# Patient Record
Sex: Female | Born: 1935
Health system: Southern US, Community
[De-identification: ages and names within clinical notes are randomized; demographics above are authoritative.]

## PROBLEM LIST (undated history)

## (undated) DIAGNOSIS — F419 Anxiety disorder, unspecified: Secondary | ICD-10-CM

## (undated) DIAGNOSIS — K219 Gastro-esophageal reflux disease without esophagitis: Secondary | ICD-10-CM

## (undated) DIAGNOSIS — E785 Hyperlipidemia, unspecified: Secondary | ICD-10-CM

## (undated) DIAGNOSIS — F329 Major depressive disorder, single episode, unspecified: Secondary | ICD-10-CM

## (undated) DIAGNOSIS — I10 Essential (primary) hypertension: Secondary | ICD-10-CM

## (undated) DIAGNOSIS — I639 Cerebral infarction, unspecified: Secondary | ICD-10-CM

## (undated) DIAGNOSIS — F3289 Other specified depressive episodes: Secondary | ICD-10-CM

## (undated) DIAGNOSIS — G459 Transient cerebral ischemic attack, unspecified: Secondary | ICD-10-CM

## (undated) HISTORY — PX: CHOLECYSTECTOMY: SHX55

## (undated) HISTORY — PX: CERVIX SURGERY: SHX593

## (undated) HISTORY — DX: Hyperlipidemia, unspecified: E78.5

## (undated) HISTORY — PX: TUBAL LIGATION: SHX77

## (undated) HISTORY — PX: BLADDER REPAIR: SHX76

## (undated) HISTORY — DX: Essential (primary) hypertension: I10

## (undated) HISTORY — DX: Major depressive disorder, single episode, unspecified: F32.9

## (undated) HISTORY — DX: Transient cerebral ischemic attack, unspecified: G45.9

## (undated) HISTORY — DX: Gastro-esophageal reflux disease without esophagitis: K21.9

## (undated) HISTORY — DX: Other specified depressive episodes: F32.89

## (undated) HISTORY — PX: TONSILLECTOMY: SUR1361

## (undated) HISTORY — DX: Cerebral infarction, unspecified: I63.9

## (undated) HISTORY — DX: Anxiety disorder, unspecified: F41.9

---

## 2000-12-13 ENCOUNTER — Ambulatory Visit (HOSPITAL_COMMUNITY): Admission: RE | Admit: 2000-12-13 | Discharge: 2000-12-13 | Payer: Self-pay | Admitting: Family Medicine

## 2000-12-13 ENCOUNTER — Encounter: Payer: Self-pay | Admitting: Family Medicine

## 2001-02-27 ENCOUNTER — Other Ambulatory Visit: Admission: RE | Admit: 2001-02-27 | Discharge: 2001-02-27 | Payer: Self-pay | Admitting: Neurology

## 2001-06-16 ENCOUNTER — Encounter: Payer: Self-pay | Admitting: Family Medicine

## 2001-06-16 ENCOUNTER — Ambulatory Visit (HOSPITAL_COMMUNITY): Admission: RE | Admit: 2001-06-16 | Discharge: 2001-06-16 | Payer: Self-pay | Admitting: Family Medicine

## 2002-06-12 ENCOUNTER — Encounter: Payer: Self-pay | Admitting: Internal Medicine

## 2002-06-12 ENCOUNTER — Ambulatory Visit (HOSPITAL_COMMUNITY): Admission: RE | Admit: 2002-06-12 | Discharge: 2002-06-12 | Payer: Self-pay | Admitting: Internal Medicine

## 2004-12-07 ENCOUNTER — Ambulatory Visit: Payer: Self-pay | Admitting: Family Medicine

## 2005-01-17 ENCOUNTER — Ambulatory Visit: Payer: Self-pay | Admitting: Family Medicine

## 2005-03-22 ENCOUNTER — Ambulatory Visit: Payer: Self-pay | Admitting: Family Medicine

## 2005-07-05 ENCOUNTER — Ambulatory Visit: Payer: Self-pay | Admitting: Family Medicine

## 2006-02-11 ENCOUNTER — Ambulatory Visit: Payer: Self-pay | Admitting: Family Medicine

## 2006-06-20 ENCOUNTER — Ambulatory Visit: Payer: Self-pay | Admitting: Family Medicine

## 2006-08-02 ENCOUNTER — Ambulatory Visit: Payer: Self-pay | Admitting: Family Medicine

## 2006-08-16 ENCOUNTER — Ambulatory Visit: Payer: Self-pay | Admitting: Family Medicine

## 2006-09-26 ENCOUNTER — Ambulatory Visit: Payer: Self-pay | Admitting: Family Medicine

## 2008-05-17 ENCOUNTER — Ambulatory Visit: Payer: Self-pay | Admitting: Cardiology

## 2008-05-17 ENCOUNTER — Inpatient Hospital Stay (HOSPITAL_COMMUNITY): Admission: EM | Admit: 2008-05-17 | Discharge: 2008-05-21 | Payer: Self-pay | Admitting: Emergency Medicine

## 2008-05-18 ENCOUNTER — Ambulatory Visit: Payer: Self-pay | Admitting: Vascular Surgery

## 2008-05-18 ENCOUNTER — Encounter (INDEPENDENT_AMBULATORY_CARE_PROVIDER_SITE_OTHER): Payer: Self-pay | Admitting: Emergency Medicine

## 2008-05-18 ENCOUNTER — Encounter (INDEPENDENT_AMBULATORY_CARE_PROVIDER_SITE_OTHER): Payer: Self-pay | Admitting: Internal Medicine

## 2008-05-21 ENCOUNTER — Encounter: Payer: Self-pay | Admitting: Internal Medicine

## 2008-06-09 ENCOUNTER — Ambulatory Visit: Payer: Self-pay | Admitting: Cardiology

## 2008-06-23 ENCOUNTER — Ambulatory Visit: Payer: Self-pay | Admitting: Internal Medicine

## 2008-07-07 ENCOUNTER — Ambulatory Visit: Payer: Self-pay

## 2008-07-14 DIAGNOSIS — I421 Obstructive hypertrophic cardiomyopathy: Secondary | ICD-10-CM | POA: Insufficient documentation

## 2008-07-14 DIAGNOSIS — I10 Essential (primary) hypertension: Secondary | ICD-10-CM | POA: Insufficient documentation

## 2008-07-14 DIAGNOSIS — E785 Hyperlipidemia, unspecified: Secondary | ICD-10-CM

## 2008-09-28 ENCOUNTER — Ambulatory Visit: Payer: Self-pay | Admitting: Cardiology

## 2008-11-15 ENCOUNTER — Ambulatory Visit: Payer: Self-pay | Admitting: Internal Medicine

## 2009-02-07 ENCOUNTER — Ambulatory Visit: Payer: Self-pay | Admitting: Internal Medicine

## 2009-07-27 ENCOUNTER — Encounter (INDEPENDENT_AMBULATORY_CARE_PROVIDER_SITE_OTHER): Payer: Self-pay | Admitting: *Deleted

## 2009-09-14 DIAGNOSIS — G459 Transient cerebral ischemic attack, unspecified: Secondary | ICD-10-CM | POA: Insufficient documentation

## 2009-09-14 DIAGNOSIS — F329 Major depressive disorder, single episode, unspecified: Secondary | ICD-10-CM | POA: Insufficient documentation

## 2009-09-14 DIAGNOSIS — K219 Gastro-esophageal reflux disease without esophagitis: Secondary | ICD-10-CM | POA: Insufficient documentation

## 2009-09-27 ENCOUNTER — Ambulatory Visit: Payer: Self-pay | Admitting: Cardiology

## 2010-01-20 ENCOUNTER — Inpatient Hospital Stay (HOSPITAL_COMMUNITY): Admission: EM | Admit: 2010-01-20 | Discharge: 2010-01-21 | Payer: Self-pay | Admitting: Emergency Medicine

## 2010-01-20 ENCOUNTER — Ambulatory Visit: Payer: Self-pay | Admitting: Internal Medicine

## 2010-01-20 ENCOUNTER — Ambulatory Visit: Payer: Self-pay | Admitting: Vascular Surgery

## 2010-01-20 ENCOUNTER — Encounter (INDEPENDENT_AMBULATORY_CARE_PROVIDER_SITE_OTHER): Payer: Self-pay | Admitting: Internal Medicine

## 2010-02-19 ENCOUNTER — Emergency Department (HOSPITAL_COMMUNITY): Admission: EM | Admit: 2010-02-19 | Discharge: 2010-02-19 | Payer: Self-pay | Admitting: Emergency Medicine

## 2010-03-15 ENCOUNTER — Ambulatory Visit: Payer: Self-pay | Admitting: Cardiology

## 2010-03-15 DIAGNOSIS — R55 Syncope and collapse: Secondary | ICD-10-CM

## 2010-03-15 DIAGNOSIS — I498 Other specified cardiac arrhythmias: Secondary | ICD-10-CM

## 2010-03-31 ENCOUNTER — Emergency Department (HOSPITAL_COMMUNITY): Admission: EM | Admit: 2010-03-31 | Discharge: 2010-03-31 | Payer: Self-pay | Admitting: Emergency Medicine

## 2010-09-19 NOTE — Assessment & Plan Note (Signed)
Summary: F1Y/ANAS  Medications Added AMBIEN 10 MG TABS (ZOLPIDEM TARTRATE) as needed FLAX SEED OIL 1000 MG CAPS (FLAXSEED (LINSEED)) as needed        Visit Type:  1 yr  Referring Provider:  n/a Primary Provider:  Joette Catching, MD  CC:  no cardiac complaints today.  History of Present Illness: [name] Williams returns today for further ventilation management of her hypertrophic obstructive cardiomyopathy and history of hypertension.  She denies any chest pain, presyncope or syncope, palpitations. Her blood pressure done a good control.  Current Medications (verified): 1)  Simvastatin 20 Mg Tabs (Simvastatin) .... At Bedtime 2)  Fish Oil 1000 Mg Caps (Omega-3 Fatty Acids) .... Once Daily 3)  Cymbalta 60 Mg Cpep (Duloxetine Hcl) .... Once Daily 4)  Multivitamins  Tabs (Multiple Vitamin) .... Once Daily 5)  Metoprolol Succinate 25 Mg Xr24h-Tab (Metoprolol Succinate) .... Once Daily 6)  Xanax 0.25 Mg Tabs (Alprazolam) .... Two Times A Day As Needed 7)  Omeprazole 20 Mg Cpdr (Omeprazole) .Marland Kitchen.. 1 By Mouth Once Daily - Every Other Day 8)  Ambien 10 Mg Tabs (Zolpidem Tartrate) .... As Needed 9)  Aggrenox 25-200 Mg Xr12h-Cap (Aspirin-Dipyridamole) .... 2 By Mouth Once Daily 10)  Flax Seed Oil 1000 Mg Caps (Flaxseed (Linseed)) .... As Needed  Allergies: 1)  ! * Ivp Dye 2)  ! * Latex 3)  ! * Shellfish  Past History:  Past Medical History: Last updated: 09/14/2009 HOCM / IHSS (ICD-425.1) TIA (ICD-435.9) GERD (ICD-530.81) HYPERLIPIDEMIA-MIXED (ICD-272.4) HYPERTENSION, BENIGN (ICD-401.1) DEPRESSION (ICD-311)    Past Surgical History: Last updated: 11/15/2008 Cervix Surgery Bladder Repair Cholecystectomy Tonsillectomy Tubal Ligation  Family History: Last updated: 02/07/2009 She is unaware of any family history of hypertrophic cardiomyopathy, arrhythmias, or sudden cardiac death.  Family History of Uterine Cancer:sister Family History of Stomach Cancer:sister Family History of  Breast Cancer:mother No FH of Colon Cancer:  Social History: Last updated: 02/07/2009 The [place]  She is  retired.  She smoked less than a half pack per day for 40 years and quit  in September.  She denies alcohol or drug use.  Patient does not get regular exercise.  Daily Caffeine Use: cup of coffee daily  Risk Factors: Exercise: no (02/07/2009)  Review of Systems       nother than history of present illness  Vital Signs:  Patient profile:   77 year old female Height:      63 inches Weight:      143 pounds BMI:     25.42 Pulse rate:   56 / minute Pulse rhythm:   irregular BP sitting:   120 / 60  (left arm) Cuff size:   large  Vitals Entered By: Danielle Rankin, CMA (September 27, 2009 12:02 PM)  Physical Exam  General:  Well developed, well nourished, in no acute distress. Head:  normocephalic and atraumatic Eyes:  PERRLA/EOM intact; conjunctiva and lids normal. Neck:  Neck supple, no JVD. No masses, thyromegaly or abnormal cervical nodes. Chest Truett Mcfarlan:  no deformities or breast masses noted Lungs:  Clear bilaterally to auscultation and percussion. Heart:  soft systolic murmur along left sternal border. Regular rate and rhythm, no gout Msk:  Back normal, normal gait. Muscle strength and tone normal. Pulses:  pulses normal in all 4 extremities Extremities:  No clubbing or cyanosis. Neurologic:  Alert and oriented x 3. Skin:  Intact without lesions or rashes. Psych:  Normal affect.   EKG  Procedure date:  09/27/2009  Findings:      sinus bradycardia, LVH and ST segment changes, no change from previous ECG  Impression & Recommendations:  Problem # 1:  HOCM / IHSS (ICD-425.1) Assessment Unchanged  Her updated medication list for this problem includes:    Metoprolol Succinate 25 Mg Xr24h-tab (Metoprolol succinate) ..... Once daily    Aggrenox 25-200 Mg Xr12h-cap (Aspirin-dipyridamole) .Marland Kitchen... 2 by mouth once daily  Problem # 2:   HYPERTENSION, BENIGN (ICD-401.1) Assessment: Improved  Her updated medication list for this problem includes:    Metoprolol Succinate 25 Mg Xr24h-tab (Metoprolol succinate) ..... Once daily  Orders: EKG w/ Interpretation (93000)  Patient Instructions: 1)  Your physician recommends that you schedule a follow-up appointment in: YEAR WITH DR Jakiah Goree 2)  Your physician recommends that you continue on your current medications as directed. Please refer to the Current Medication list given to you today.

## 2010-09-19 NOTE — Assessment & Plan Note (Signed)
Summary: eph  Medications Added OMEPRAZOLE 20 MG CPDR (OMEPRAZOLE) 1 tab as needed LEXAPRO 20 MG TABS (ESCITALOPRAM OXALATE) 1 tab once daily VITAMIN B-12 2500 MCG SUBL (CYANOCOBALAMIN) 1 once daily ASPIRIN 81 MG TBEC (ASPIRIN) as needed        Visit Type:  EPH Referring Provider:  n/a Primary Provider:  Joette Catching, MD  CC:  pt states he husband was just in the hospital and she said they were told he did not have too much longer and says this has been very hard on her...she states she has some sob at times...no other complaints today.  History of Present Illness: Ms Manternach returns today after being discharged from the hospital with syncope and bradycardia.  During her hospital stay, she had a repeat echocardiogram which demonstrated an EF of 60% with mild mitral regurgitation mild left atrial enlargement. There was no change in her apical hypertrophy. Echo was essentially stable.  CT Scan of the head was negative. Chest x-ray showed COPD.  Her hypotension and bradycardia resolved with stopping her metoprolol. She's had no recurrence. Denies palpitations or chest pain.  Current Medications (verified): 1)  Fish Oil 1000 Mg Caps (Omega-3 Fatty Acids) .... Once Daily 2)  Multivitamins  Tabs (Multiple Vitamin) .... Once Daily 3)  Omeprazole 20 Mg Cpdr (Omeprazole) .Marland Kitchen.. 1 Tab As Needed 4)  Aggrenox 25-200 Mg Xr12h-Cap (Aspirin-Dipyridamole) .... 2 By Mouth Once Daily 5)  Lexapro 20 Mg Tabs (Escitalopram Oxalate) .Marland Kitchen.. 1 Tab Once Daily 6)  Vitamin B-12 2500 Mcg Subl (Cyanocobalamin) .Marland Kitchen.. 1 Once Daily 7)  Aspirin 81 Mg Tbec (Aspirin) .... As Needed  Allergies: 1)  ! * Ivp Dye 2)  ! * Latex 3)  ! * Shellfish  Past History:  Past Medical History: Last updated: 09/14/2009 HOCM / IHSS (ICD-425.1) TIA (ICD-435.9) GERD (ICD-530.81) HYPERLIPIDEMIA-MIXED (ICD-272.4) HYPERTENSION, BENIGN (ICD-401.1) DEPRESSION (ICD-311)    Past Surgical History: Last updated: 11/15/2008 Cervix  Surgery Bladder Repair Cholecystectomy Tonsillectomy Tubal Ligation  Family History: Last updated: 02/07/2009 She is unaware of any family history of hypertrophic cardiomyopathy, arrhythmias, or sudden cardiac death.  Family History of Uterine Cancer:sister Family History of Stomach Cancer:sister Family History of Breast Cancer:mother No FH of Colon Cancer:  Social History: Last updated: 02/07/2009 The patient [place] in [place], [place]  She is  retired.  She smoked less than a half pack per day for 40 years and quit  in September.  She denies alcohol or drug use.  Patient does not get regular exercise.  Daily Caffeine Use: cup of coffee daily  Risk Factors: Exercise: no (02/07/2009)  Review of Systems       negative other than history of present illness  Vital Signs:  Patient profile:   77 year old female Height:      63 inches Weight:      139 pounds BMI:     24.71 Pulse rate:   64 / minute Pulse rhythm:   irregular BP sitting:   132 / 68  (left arm) Cuff size:   large  Vitals Entered By: Danielle Rankin, CMA (March 15, 2010 11:05 AM)  Physical Exam  General:  elderly, no acute distress Head:  normocephalic and atraumatic Eyes:  PERRLA/EOM intact; conjunctiva and lids normal. Neck:  Neck supple, no JVD. No masses, thyromegaly or abnormal cervical nodes. Chest Kaileena Obi:  no deformities or breast masses noted Lungs:  Clear bilaterally to auscultation and percussion. Heart:  PMI nondisplaced, regular rate and rhythm, normal S1-S2, no murmur  Msk:  decreased ROM.   Pulses:  pulses normal in all 4 extremities Extremities:  No clubbing or cyanosis. Neurologic:  Alert and oriented x 3. Skin:  Intact without lesions or rashes. Psych:  Normal affect.   Problems:  Medical Problems Added: 1)  Dx of Bradycardia  (ICD-427.89) 2)  Dx of Syncope and Collapse  (ICD-780.2)  EKG  Procedure date:  03/15/2010  Findings:      normal sinus rhythm, LVH with ST-T wave  changes, no change.  Impression & Recommendations:  Problem # 1:  HOCM / IHSS (ICD-425.1) Assessment Unchanged  The following medications were removed from the medication list:    Metoprolol Succinate 25 Mg Xr24h-tab (Metoprolol succinate) ..... Once daily Her updated medication list for this problem includes:    Aggrenox 25-200 Mg Xr12h-cap (Aspirin-dipyridamole) .Marland Kitchen... 2 by mouth once daily    Aspirin 81 Mg Tbec (Aspirin) .Marland Kitchen... As needed  Problem # 2:  HYPERTENSION, BENIGN (ICD-401.1) Assessment: Improved  The following medications were removed from the medication list:    Metoprolol Succinate 25 Mg Xr24h-tab (Metoprolol succinate) ..... Once daily Her updated medication list for this problem includes:    Aspirin 81 Mg Tbec (Aspirin) .Marland Kitchen... As needed  Problem # 3:  SYNCOPE AND COLLAPSE (ICD-780.2) No recurrence off of beta blocker. She is also no longer bradycardic. The following medications were removed from the medication list:    Metoprolol Succinate 25 Mg Xr24h-tab (Metoprolol succinate) ..... Once daily Her updated medication list for this problem includes:    Aggrenox 25-200 Mg Xr12h-cap (Aspirin-dipyridamole) .Marland Kitchen... 2 by mouth once daily    Aspirin 81 Mg Tbec (Aspirin) .Marland Kitchen... As needed  Problem # 4:  BRADYCARDIA (ICD-427.89) Assessment: Improved  The following medications were removed from the medication list:    Metoprolol Succinate 25 Mg Xr24h-tab (Metoprolol succinate) ..... Once daily Her updated medication list for this problem includes:    Aggrenox 25-200 Mg Xr12h-cap (Aspirin-dipyridamole) .Marland Kitchen... 2 by mouth once daily    Aspirin 81 Mg Tbec (Aspirin) .Marland Kitchen... As needed  The following medications were removed from the medication list:    Metoprolol Succinate 25 Mg Xr24h-tab (Metoprolol succinate) ..... Once daily Her updated medication list for this problem includes:    Aggrenox 25-200 Mg Xr12h-cap (Aspirin-dipyridamole) .Marland Kitchen... 2 by mouth once daily    Aspirin 81 Mg  Tbec (Aspirin) .Marland Kitchen... As needed  Patient Instructions: 1)  Your physician recommends that you schedule a follow-up appointment in: 12 months with Dr Daleen Squibb 2)  Your physician recommends that you continue on your current medications as directed. Please refer to the Current Medication list given to you today.

## 2010-11-03 LAB — DIFFERENTIAL
Eosinophils Relative: 1 % (ref 0–5)
Lymphocytes Relative: 46 % (ref 12–46)
Lymphs Abs: 4.3 10*3/uL — ABNORMAL HIGH (ref 0.7–4.0)

## 2010-11-03 LAB — CBC
MCV: 94.3 fL (ref 78.0–100.0)
Platelets: 166 10*3/uL (ref 150–400)
RBC: 4.4 MIL/uL (ref 3.87–5.11)
WBC: 9.4 10*3/uL (ref 4.0–10.5)

## 2010-11-03 LAB — BASIC METABOLIC PANEL
Chloride: 106 mEq/L (ref 96–112)
Creatinine, Ser: 1.04 mg/dL (ref 0.4–1.2)
GFR calc Af Amer: >60 mL/min (ref >60)
Potassium: 4.3 mEq/L (ref 3.5–5.1)
Sodium: 140 mEq/L (ref 135–145)

## 2010-11-03 LAB — URINALYSIS, ROUTINE W REFLEX MICROSCOPIC
Nitrite: NEGATIVE
Specific Gravity, Urine: 1.013 (ref 1.005–1.030)
pH: 5.5 (ref 5.0–8.0)

## 2010-11-03 LAB — URINE CULTURE: Culture  Setup Time: 201108122109

## 2010-11-03 LAB — URINE MICROSCOPIC-ADD ON

## 2010-11-06 LAB — CBC
HCT: 38.4 % (ref 36.0–46.0)
Hemoglobin: 12.7 g/dL (ref 12.0–15.0)
MCHC: 34.4 g/dL (ref 30.0–36.0)
MCV: 97 fL (ref 78.0–100.0)
MCV: 97.5 fL (ref 78.0–100.0)
Platelets: 126 10*3/uL — ABNORMAL LOW (ref 150–400)
Platelets: 142 10*3/uL — ABNORMAL LOW (ref 150–400)
RBC: 3.94 MIL/uL (ref 3.87–5.11)
RDW: 13 % (ref 11.5–15.5)
WBC: 11.2 10*3/uL — ABNORMAL HIGH (ref 4.0–10.5)
WBC: 9.6 10*3/uL (ref 4.0–10.5)

## 2010-11-06 LAB — CARDIAC PANEL(CRET KIN+CKTOT+MB+TROPI)
CK, MB: 2.5 ng/mL (ref 0.3–4.0)
CK, MB: 2.9 ng/mL (ref 0.3–4.0)
Relative Index: INVALID (ref 0.0–2.5)
Total CK: 63 U/L (ref 7–177)
Troponin I: 0.02 ng/mL (ref 0.00–0.06)

## 2010-11-06 LAB — COMPREHENSIVE METABOLIC PANEL
ALT: 18 U/L (ref 0–35)
AST: 22 U/L (ref 0–37)
AST: 22 U/L (ref 0–37)
Albumin: 3.2 g/dL — ABNORMAL LOW (ref 3.5–5.2)
Albumin: 3.3 g/dL — ABNORMAL LOW (ref 3.5–5.2)
Alkaline Phosphatase: 53 U/L (ref 39–117)
Calcium: 8.3 mg/dL — ABNORMAL LOW (ref 8.4–10.5)
Chloride: 106 mEq/L (ref 96–112)
Creatinine, Ser: 1.07 mg/dL (ref 0.4–1.2)
GFR calc Af Amer: 60 mL/min — ABNORMAL LOW (ref >60)
GFR calc Af Amer: >60 mL/min (ref >60)
Glucose, Bld: 104 mg/dL — ABNORMAL HIGH (ref 70–99)
Potassium: 3.9 mEq/L (ref 3.5–5.1)
Sodium: 140 mEq/L (ref 135–145)
Total Protein: 5.8 g/dL — ABNORMAL LOW (ref 6.0–8.3)

## 2010-11-06 LAB — URINE CULTURE: Colony Count: NO GROWTH

## 2010-11-06 LAB — POCT I-STAT, CHEM 8
BUN: 19 mg/dL (ref 6–23)
Chloride: 105 mEq/L (ref 96–112)
Creatinine, Ser: 1.1 mg/dL (ref 0.4–1.2)
Potassium: 4.2 mEq/L (ref 3.5–5.1)
Sodium: 141 mEq/L (ref 135–145)

## 2010-11-06 LAB — URINALYSIS, ROUTINE W REFLEX MICROSCOPIC
Ketones, ur: NEGATIVE mg/dL
Leukocytes, UA: NEGATIVE
Nitrite: NEGATIVE
Protein, ur: NEGATIVE mg/dL

## 2010-11-06 LAB — DIFFERENTIAL
Lymphocytes Relative: 42 % (ref 12–46)
Lymphs Abs: 4.7 10*3/uL — ABNORMAL HIGH (ref 0.7–4.0)
Monocytes Relative: 9 % (ref 3–12)
Neutro Abs: 5.3 10*3/uL (ref 1.7–7.7)
Neutrophils Relative %: 47 % (ref 43–77)

## 2010-11-06 LAB — URINE MICROSCOPIC-ADD ON

## 2010-11-06 LAB — TSH: TSH: 1.908 u[IU]/mL (ref 0.350–4.500)

## 2010-11-10 ENCOUNTER — Encounter: Payer: Self-pay | Admitting: *Deleted

## 2010-11-17 ENCOUNTER — Ambulatory Visit (INDEPENDENT_AMBULATORY_CARE_PROVIDER_SITE_OTHER): Payer: Self-pay | Admitting: Cardiology

## 2010-11-17 ENCOUNTER — Encounter: Payer: Self-pay | Admitting: Cardiology

## 2010-11-17 DIAGNOSIS — I498 Other specified cardiac arrhythmias: Secondary | ICD-10-CM

## 2010-11-17 DIAGNOSIS — I421 Obstructive hypertrophic cardiomyopathy: Secondary | ICD-10-CM

## 2010-11-17 NOTE — Progress Notes (Signed)
   Patient ID: [name] Williams, female    DOB: Mar 09, 1928, 77 y.o.   MRN: [id number]  HPI  Mrs Costanza returns for E and M of her HOCM. No chest pain, palpitation, DOE, of syncope. On no cardiovascular meds except Aggrenox.    Review of Systems  All other systems reviewed and are negative.      Physical Exam  Constitutional: She is oriented to person, place, and time. She appears well-developed and well-nourished.  HENT:  Head: Normocephalic and atraumatic.  Eyes: EOM are normal. Pupils are equal, round, and reactive to light.  Neck: Neck supple. No JVD present. No tracheal deviation present. No thyromegaly present.  Cardiovascular: Normal rate, regular rhythm and normal heart sounds.        Distal pulses in LE's reduced, soft systolic murmur.  Pulmonary/Chest: Effort normal and breath sounds normal.  Abdominal: Soft. Bowel sounds are normal.  Musculoskeletal: Normal range of motion. She exhibits no edema.  Neurological: She is alert and oriented to person, place, and time.  Skin: Skin is warm and dry.  Psychiatric: She has a normal mood and affect.

## 2010-11-17 NOTE — Assessment & Plan Note (Signed)
Stable, EKG more dramatic ST changes, yet asymptomatic. No change in management.

## 2010-11-17 NOTE — Assessment & Plan Note (Signed)
Resolved

## 2010-11-17 NOTE — Patient Instructions (Signed)
Your physician recommends that you schedule a follow-up appointment in: 1 year with Dr. Wall  
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
## 2011-01-02 NOTE — Assessment & Plan Note (Signed)
[facility]                            CARDIOLOGY OFFICE NOTE   NAME:FULPTobey Grim                          MRN:          [id number]  DATE:09/28/2008                            DOB:          August 15, 1928    Ms. Revere returns today for followup.  I had her see Dr. Hillis Range for  possible consideration of AICD with her nonsustained VT and hypertrophic  obstructive cardiomyopathy.   She did remarkably well on a stress test.  Fayrene Fearing felt she could be  managed with medical therapy.   She offers no complaints today.  She really like to come off of her  Zocor, but I have talked to her at length today about the pros of taking  this medication, reducing strokes, heart attacks, vascular events.   [name] on Aggrenox 1 twice a day, metoprolol succinate 25 mg a  day, multivitamin, Cymbalta, fish oil 1000 mg a day, Zocor 20 mg a day.   PHYSICAL EXAMINATION:  VITAL SIGNS:  Her blood pressure is 126/70, her  pulse is 60 and regular, weight is 152.  HEENT:  Unchanged.  NECK:  Carotid upstrokes are equal bilaterally without bruits.  No JVD.  Thyroid is not enlarged.  Trachea is midline.  LUNGS:  Clear to auscultation and percussion.  HEART:  Systolic murmur along the left sternal border.  S2 splits.  ABDOMEN:  Soft.  Good bowel sounds.  No midline bruit.  No hepatomegaly.  EXTREMITIES:  No cyanosis, clubbing, or edema.  Pulses are intact.  NEUROLOGIC:  Intact.   I am pleased with how Ms. Howells's stress test in EP evaluation turned  out.  She will continue with her current medications.  I will see her  back in a year.     Thomas C. Daleen Squibb, MD, [facility]  Electronically Signed    TCW/MedQ  DD: 09/28/2008  DT: 09/29/2008  Job #: [id number]   cc:   Delaney Meigs, M.D.

## 2011-01-02 NOTE — Discharge Summary (Signed)
[name] Williams, [name] Williams                 ACCOUNT NO.:  [id number]   MEDICAL RECORD NO.:  [id number]          PATIENT TYPE:  INP   LOCATION:  5530                         FACILITY:  [facility]   PHYSICIAN:  Beckey Rutter, MD  DATE OF BIRTH:  1928/08/10   DATE OF ADMISSION:  05/17/2008  DATE OF DISCHARGE:  05/21/2008                               DISCHARGE SUMMARY   PRIMARY CARE PHYSICIAN:  Delaney Meigs, MD   CHIEF COMPLAINT AND HISTORY OF PRESENT ILLNESS:  1. This is a 77 year old very pleasant well-groomed Caucasian female      who was admitted for slurred speech.  The patient was admitted for      transient ischemic attack/CVA workup.  The workup was essentially      negative.  Please see below.  2. The patient had 14 beats of nonsustained V-tach and had 2-D echo as      well as cardiac MRI as below.  The patient was cleared for      discharge today by Cardiology.   There is a change on her medication to add aspirin and Plavix.  The  patient should also take Zocor.  The patient was taking Ativan p.r.n.  and was changed to Xanax.  Please see the discharge medication list  below.  1. Urinary tract infection.  The patient has evidence of urinary tract      infection on admission with more than 100,000 colonies of E. coli.      The E. coli is sensitive to pretty much all the antibiotics tested      for.  The patient received Rocephin during the hospital stay with      good response.  In fact, the urinary tract infection could be      additional culprit to the presenting complaint.  The patient has      been on Rocephin since May 18, 2008 and I will discharge the      patient on amoxicillin to finish a course of 7 days of antibiotic,      i.e. 3 more days.   HOSPITAL PROCEDURES:  EEG done on May 20, 2008.  Impression was  showing the EEG performed during awake and drowsy state was within  normal limits.  Lupus anticoagulant panel is negative.  Urine culture is  showing E.  coli with more than 1000 colonies sensitive to all  antibiotics tested.  TSH is 1.76.  ESR is 15.  RPR is not reactive.  Lipid profile was showing triglycerides of 144, total cholesterol of  156, HDL 25, and LDL 102.  White blood count is 12.0, hemoglobin is  14.2, hematocrit is 41.8, and platelet count is 227.   On May 17, 2008, CT head without contrast was showing mild age-  related atrophy.  No sign of focal or acute insult.   On May 17, 2008, chest x-ray was showing no active cardiopulmonary  disease.   On May 18, 2008, the MRI and MRA of head was done.  The MRI result  was showing atrophy and small-vessel disease.  No acute intracranial  finding.  The MRA was showing no significant proximal stenosis  identified.  On the same day, the patient had MRI to the head.  Impression was showing:  1. Mild carotid bifurcation atherosclerosis without ICA stenosis.  2. Dominant right vertebral artery is tortuous, otherwise within      normal limits.  3. Nondominant left vertebral artery origin is not identified.  It      might arise directly from the aortic arch.   On May 20, 2008, the patient had MRI for the heart/cardiac MRI.  Impression was showing:  1. Mild and apical hypertrophic cardiomyopathy.  2. Normal left ventricular ejection fraction 71%, apical akinesis.  3. Very small area of subendothelial scar involving the true apex.  4. Moderate left atrial enlargement.   DISCHARGE DIAGNOSES:  1. Slurred speech, diagnosed as transient ischemic attack.  2. A nonsustained ventricular tachycardia with mid and apical      hypertrophic cardiomyopathy.  3. Gastroesophageal reflux disease.  4. Dyslipidemia.  5. Depression.  6. Hypertension.   DISCHARGE MEDICATIONS:  1. Aspirin 81 mg daily.  2. Plavix 75 mg daily.  3. Fish oil 1200 mg b.i.d.  4. Multivitamins daily.  5. Cymbalta 30 mg daily.  6. Xanax 0.25 mg p.o. b.i.d. p.r.n.  7. Zocor 20 mg p.o. at bedtime.  8.  Plavix 75 mg p.o. daily.  9. Amoxicillin/Amoxil 500 mg p.o. q.8 h for 3 more days.   DISCHARGE AND PLAN:  The patient is stable for discharge today.  She was  advised to follow up with [facility] as well as with her primary  physician.  She is aware and agreeable to the discharge plan.      Beckey Rutter, MD  Electronically Signed     EME/MEDQ  D:  05/21/2008  T:  05/22/2008  Job:  [id number]   cc:   Delaney Meigs, M.D.

## 2011-01-02 NOTE — Procedures (Signed)
EEG NUMBER:  [id number]   CLINICAL HISTORY:  This is a 77 year old lady being evaluated for TIA  versus seizures.   MEDICATION LISTED:  Xanax, aspirin, Rocephin, Plavix, Cymbalta, Lovenox,  Lovaza, Protonix, Zocor, and Ambien.   This is a portable 17-channel EEG recorded with the patient awake and  drowsy using standard 10/20 electrode placement.   Background awake rhythm consists of 10-12 Hz alpha, which is of  diminished amplitude, synchronous reactive to eye-opening and closure.  High frequency, low amplitude beta activity seen throughout the tracing,  which is likely a medication effect.  No paroxysmal epileptiform  activity, spikes, or sharp waves are noted.  Length of the recording is  21.4 minutes.  Definite sleep stages are not noted except mild  drowsiness.  Hyperventilation and photic stimulation are unremarkable.  EKG tracing reveals regular sinus rhythm.  Technical component of study  is average.   IMPRESSION:  This EEG performed during awake and drowsy state is within  normal limits.           ______________________________  Sunny Schlein. Pearlean Brownie, MD     [id number]  D:  05/20/2008 17:54:26  T:  05/21/2008 02:23:35  Job #:  [id number]

## 2011-01-02 NOTE — Letter (Signed)
July 07, 2008    Jesse Sans. Wall, MD, FACC  [address]  [address]  [place], [place] [address]   RE:  [name], Williams  MRN:  [id number]  /  DOB:  [birth year]   Dear Elijah Birk,   It was my pleasure to see your patient [name] Williams in EP consultation.  As you recall, she is a 77 year old female with nonsustained ventricular  tachycardia and recently diagnosed hypertrophic cardiomyopathy.  I saw  her recently on June 23, 2008.  She is presently asymptomatic from  her hypertrophic cardiomyopathy and doing very well.  She is quite  active and denies presyncope or syncope.  I was asked to see her  regarding risk factors for sudden death, which include hypertrophic  cardiomyopathy and nonsustained ventricular tachycardia.  She does not  appear to have a septal thickness of greater than 2 cm, family history  of sudden death, history of syncope, history resuscitation, or other  findings to suggest high risk for sudden death.  I took the liberty to  perform a GXT today to evaluate for hypotensive response to exercise.  She exercised to stage III for 7 minutes and 7 seconds and reached a  heart rate of 95% predicted maximum heart rate (136 beats per minute).  She had a good heart rate response with no hypotension.  She terminated  the test due to fatigue.  She had no ventricular arrhythmias during the  study.  Given the results of her treadmill today, I do not think that we  should proceed with ICD implantation.  I have discussed this in detail  with the patient today.  I think that her risk for sudden cardiac death  is low and does not warrant the risk of device  implantation.  I have however informed the patient that should she have  syncope that I would like to be apprised of this.  I have returned her  to your care.  Please feel free to contact me should any problems arise.    Sincerely,      Hillis Range, MD  Electronically Signed    JA/MedQ  DD: 07/07/2008  DT: 07/08/2008  Job #:  [id number]   CC:    Delaney Meigs, M.D.

## 2011-01-02 NOTE — H&P (Signed)
NAMETEYLOR, WOLVEN NO.:  [id number]   MEDICAL RECORD NO.:  [id number]          PATIENT TYPE:  EMS   LOCATION:  [facility]                         FACILITY:  [facility]   PHYSICIAN:  Ladell Pier, M.D.   DATE OF BIRTH:  1928-08-17   DATE OF ADMISSION:  05/17/2008  DATE OF DISCHARGE:                              HISTORY & PHYSICAL   CHIEF COMPLAINT:  Slurred speech.   HISTORY OF PRESENT ILLNESS:  The patient is a 77 year old white female  that presented to the emergency room with slurred speech.  The patient  stated that she woke for about 8:08, this morning and they went out to  breakfast at about 8:30.  She was sitting down getting ready to her  oatmeal and her daughter-in-law noted that her speech was slurred.  The  patient cannot remember eating her oatmeal.  She stated that her  daughter-in-law also noted that she had a facial droop, she is not sure  which side.  Her daughter-in-law has already left.  Per the patient,  this is the third episode that she has had recently.  She stated that  the first episode her husband noted that she was a bit confused one  night.  The second episode she stated that she was out eating breakfast  with her husband and the owner of the restaurant noted that she was  drooling out of one side of her mouth and her nose was running.  The  patient stated that she did not even know this happened, was not even  aware of any of that.  She stated that she has had no chest pain, no  shortness of breath, no headache.  She stated that the second time the  owner of the restaurant noted she was drooling from one side of her  mouth.  She does not remember what happened, but she remembers going to  work out.  She normally goes to [employer], and she got to the door and did  not in because she just felt so off balance.  She had no other  complaints today.   PAST MEDICAL HISTORY:  1. GERD.  2. Dyslipidemia.  3. Depression.  4. Hypertension.  5. She is  status post cholecystectomy.  6. History of kidney stone.  7. She has a rectocele   FAMILY HISTORY:  Mother died of old age, she was about 100.  Father died  at 28 from a stroke.   SOCIAL HISTORY:  She smokes about one pack of cigarettes per week.  Occasional alcohol use.  She is married.  She has three children.   MEDICATIONS:  1. Aspirin 81 mg daily.  2. Fish oil 1200 mg b.i.d.  3. Multivitamin daily.  4. Cymbalta 30 mg daily.  5. Ativan 10 mg nightly p.r.n.  6. Simvastatin 20 mg nightly.   ALLERGIES:  NO KNOWN DRUG ALLERGIES.   REVIEW OF SYSTEMS:  As per stated in the HPI, otherwise negative.   PHYSICAL EXAMINATION:  VITAL SIGNS:  Temperature 98.3, blood pressure  128/73, pulse of 58, respirations 16.  Pulse ox 96%  on room air.  GENERAL:  The patient is sitting up in bed eating dinner, does not seem  in any acute distress.  HEENT:  Head is normocephalic, atraumatic.  Pupils equal, round and reactive to light.  Throat is without erythema.  CARDIOVASCULAR:  Regular rate and rhythm.  No murmurs, rubs or gallops.  LUNGS:  Clear bilaterally.  No wheezes, rhonchi or rales.  ABDOMEN:  Soft, nontender, nondistended.  Positive bowel sounds.  EXTREMITIES:  Without edema.  NEURO:  Nonfocal.  Strength 5/5 throughout.  She does not have any  facial asymmetry.  Finger-to-nose intact.   LABORATORY DATA:  WBC 11.4, hemoglobin 14.4, MCV 91.4, platelets 248.  PT 13.6, INR 1.0, PTT 28.  Sodium 139, potassium 4.0, chloride 105, CO2  of 29, glucose 126, BUN 11, creatinine 0.90.  Head CT showed some mild  age related atrophy, no sign of focal or acute insult.  EKG showed  diffuse T-wave inversion and LVH with strain pattern.   ASSESSMENT/PLAN:  1. Question of transient ischemic attack.  2. Hypertension.  3. Dyslipidemia.  4. Depression.  5. Gastroesophageal reflux disease.  6. Mild leukocytosis.   Will admit the patient to the hospital.  Will rule out for a TIA/stroke  with MRI, MRA,  carotid Dopplers, 2-D echo.  Will check CMP, CBC, EEG to  rule out seizure.  Will also check a chest x-ray with her tobacco  history.  Will check a fasting lipid panel as well and will monitor on  telemetry.  The patient seems to be neurologically intact.  Will start  her on 325 mg aspirin a day.  Will continue her home cholesterol and  antihypertensive medication.  Her blood pressure seemed good at 128/73.  Will also cycle cardiac markers.  Neurology is also following the  patient.      Ladell Pier, M.D.  Electronically Signed     NJ/MEDQ  D:  05/17/2008  T:  05/17/2008  Job:  [id number]

## 2011-01-02 NOTE — Assessment & Plan Note (Signed)
[facility]                            CARDIOLOGY OFFICE NOTE   NAME:FULPTobey Grim                          MRN:          [id number]  DATE:06/09/2008                            DOB:          [birth year]    Ms. [name] Williams returns from the hospital after being discharged with a  possible TIA.  She presented with slurred speech.  Her workup was  essentially negative.   We saw her in consultation because of a 14-beat run of nonsustained V-  tach.  A 2D echocardiogram showed apical hypertrophic obstructive  cardiomyopathy with severe apical hypokinesia.  She had near cavity  obliteration of the apex and mid ventricle with a spade-like basal  concavity.   An MRI was performed, which showed minimal scar tissue at the apex.  Her  overall EF was 71%.  The scar was mostly subendocardial involving the  true apex.   She brings in EKG today, which goes back to 2001.  There were abnormal  with ST-segment changes with T-wave inversion anteriorly as well as  biphasic T-waves inferiorly.  This is consistent with her findings today  and in the hospital.  She is obviously had this condition for some time.   She has never had syncope.  She has never had any tachypalpitations and  is a very active lady for her age.  There is no history of sudden death  in her family.   We did not start her on a beta-blocker in the hospital because her  resting heart rate was about 60.   Since discharge, she has been asymptomatic.  He has had no further  slurred speech.   MEDICATIONS:  1. [name] on aspirin 81 mg a day.  2. Plavix 75 mg a day.  3. Fish oil 1000 mg a day.  4. Cymbalta 60 mg a day.  5. Protonix 40 mg nightly.  6. Zocor 20 mg nightly.   Her EKG today shows sinus rhythm with ST-segment changes, which are not  quite as profoundly as they were even back in 2001.  Her QTC is about  460 milliseconds.  PR and QRS are normal.   The rest of her exam is  unchanged.   I had a long talk today with [name] and her daughter-in-law.  I will  start her on a low-dose beta-blocker 25 mg a day of Toprol.  I am going  to arrange for her to see Dr. Hillis Range for any other consideration  of treating her nonsustained VT.  Most likely, we will probably continue  to treat her with beta-blockade at this point in time.   I would like to see her back again in about 3 months for followup.     Thomas C. Daleen Squibb, MD, [facility]  Electronically Signed    TCW/MedQ  DD: 06/09/2008  DT: 06/10/2008  Job #: [id number]   cc:   Delaney Meigs, M.D.

## 2011-01-02 NOTE — Procedures (Signed)
[facility]                              EXERCISE TREADMILL   NAME:[name] Williams                          MRN:          [id number]  DATE:07/07/2008                            DOB:          02-16-1928    PRIMARY CARE PHYSICIAN:  Delaney Meigs, M.D.   PRIMARY CARDIOLOGIST:  Jesse Sans. Wall, MD, [facility]   PROCEDURE:  Exercise treadmill test.   INDICATIONS:  Hypertrophic cardiomyopathy.   The patient was exercised using a standard Bruce protocol.  She  presented with sinus bradycardia with a ventricular rate of 57 beats per  minute.  Her EKG revealed marked left ventricular hypertrophy with  repolarization changes.  An exercise treadmill test was performed in  order to evaluate for exercise-induced hypotension or ventricular  arrhythmias for further risk stratification of the patient's  hypertrophic cardiomyopathy.  She exercised for 7 minutes and 7 seconds,  achieving 8.7 mets, and a peak heart rate of 136 beats per minute which  was 95% of her predicted maximum heart rate.  During stage I, her blood  pressure was 139/81.  During stage II, her blood pressure was 155/70 and  during stage III, her blood pressure was 187/63.  She had a good  exercise effort and discontinued the study due to fatigue.  She did not  have chest discomfort, shortness of breath, presyncope, or syncope.  There were no ischemic ST/T-wave changes.  There were no ventricular  arrhythmias noted.  The patient did not have a hypotensive blood  pressure response to activity.  She reported no chest discomfort.   CONCLUSIONS:  Good exercise effort with no ischemic ST/T-wave EKG  changes, no ventricular arrhythmias, and no hypotensive blood pressure  response to exercise.   PLAN:  Given the patient's low clinical risks for certain cardiac death  and a normal blood pressure response to activities, I do not believe  that further risk stratification is necessary at this time.  I had a  long discussion with the patient today regarding the risk, benefits, and  alternatives to implantable cardioverter-defibrillator implantation for  sudden cardiac death in a patient with hypertrophic cardiomyopathy.  I  think that her risk for sudden cardiac death is low.  Though, her risk  is certainly not 0, I think that the risk for device implantation  exceeds her expected risks for sudden cardiac death at this time.  She  should therefore followup with Dr. Valera Castle for  routine followup.  She is aware to contact me should she have  presyncope, syncope, or other concerns.     Hillis Range, MD  Electronically Signed    JA/MedQ  DD: 07/07/2008  DT: 07/08/2008  Job #: [id number]   cc:   Thomas C. Daleen Squibb, MD, [facility]  Delaney Meigs, M.D.

## 2011-01-02 NOTE — Letter (Signed)
June 23, 2008    Jesse Sans. Wall, MD, FACC  [address]  [address]  [place], [place] [address]   RE:  [name] Williams, [name] Williams  MRN:  [id number]  /  DOB:  [birth year]   Dear Elijah Birk:   It was my pleasure to see your patient, Patt Steinhardt in EP consultation  today regarding nonsustained ventricular tachycardia.  As you are aware,  she is a very pleasant 77 year old female with nonsustained ventricular  tachycardia and recently diagnosed hypertrophic cardiomyopathy.  She was  recently hospitalized after developing slurred speech on May 17, 2008.  As part of her stroke workup, she had an echocardiogram performed  which revealed apical hypertrophic cardiomyopathy with a preserved  ejection fraction, but severe apical hypokinesis.  Cavity obliteration  of the apex and mid ventricle with spade-like deformity was observed.  She therefore underwent a cardiac MRI scan which revealed mid and apical  hypertrophic cardiomyopathy with an ejection fraction of 71% and apical  akinesis.  The mid interventricular septum was felt to be approximately  18 mm in size.  The patient was felt to have a transient ischemic event.  She was observed to have a 14 beat run of nonsustained ventricular  tachycardia during her hospital stay.  She was initiated on metoprolol  25 mg daily.  She reports doing very well since the discharge.  She  remains very active.  She denies any symptomatic heart racing, chest  discomfort, shortness of breath, orthopnea, PND, presyncope, or syncope.  She has never had syncope in the past.  She reports dancing several  times per week and goes to [employer] for aerobic conditioning.  She is  otherwise without complaint today.   PAST MEDICAL HISTORY:  1. Hypertrophic cardiomyopathy (as above).  2. Hypertension.  3. Hyperlipidemia.  4. Recent transient ischemic attack (as above).  5. Depression.  6. GERD.   ALLERGIES:  No known drug allergies.   MEDICATIONS:  1. Aspirin 81 mg daily.  2.  Plavix 75 mg daily.  3. Simvastatin 20 mg nightly.  4. Fish oil 1000 mg daily.  5. Cymbalta 60 mg daily.  6. Multivitamin daily.  7. B12 daily.  8. Metoprolol succinate 25 mg daily.  9. Xanax 0.25 mg b.i.d. p.r.n.   SOCIAL HISTORY:  [place]  She is  retired.  She smoked less than a half pack per day for 40 years and quit  in September.  She denies alcohol or drug use.   FAMILY HISTORY:  She is unaware of any family history of hypertrophic  cardiomyopathy, arrhythmias, or sudden cardiac death.   REVIEW OF SYSTEMS:  All systems are reviewed and negative except as  outlined in the HPI above.   PHYSICAL EXAMINATION:  VITAL SIGNS:  Blood pressure 128/70, heart rate  64, respirations 18, and weight 153 pounds.  GENERAL:  The patient is a thin female in no acute distress.  She is  alert and oriented x3.  HEENT:  Normocephalic and atraumatic.  Sclerae are clear.  Conjunctivae  pink.  Oropharynx is clear.  NECK:  Supple.  No JVD, lymphadenopathy, or bruits.  LUNGS:  Clear to auscultation bilaterally.  HEART:  Regular rate and rhythm.  2/6 systolic ejection murmur along the  left sternal border.  GI:  Soft, nontender, and nondistended.  Positive bowel sounds.  EXTREMITIES:  No clubbing, cyanosis, or edema.  NEUROLOGY:  Strength and sensation are intact.  SKIN:  No ecchymosis or laceration.  MUSCULOSKELETAL:  No deformity or atrophy.  PSYCHIATRY:  Euthymic mood, full affect.   EKG from June 09, 2008 reveals sinus rhythm at 63 beats per minute  with left ventricular hypertrophy and diffuse ST/T-wave changes.   IMPRESSION:  Ms. Kevorkian is a pleasant 77 year old female with a history of  recently diagnosed apical hypertrophic cardiomyopathy and nonsustained  ventricular tachycardia who presents today for EP consultation.  The  patient is presently asymptomatic and doing quite well.  She is unaware  of any further ventricular tachycardia.  I reviewed  with the patient  today her risk factors for sudden cardiac death which include  hypertrophic cardiomyopathy and nonsustained ventricular tachycardia.  She does not appear to have a septal thickness of greater than 2 cm,  family history of sudden death, history of syncope or resuscitation or  other findings.  Given that she only has one risk factor, I think that  she is at low risk for sudden cardiac death.  It would be prudent to  obtain a GXT to evaluate for a hypotensive response to exercise.  Should  she have a documented hypertensive response to exercise, we might then  consider her to be at moderate risk for sudden death.   PLAN:  Therapeutic strategies for nonsustained ventricular tachycardia  and risk stratification in a hypertrophic patient were discussed in  detail with the patient today as described above.  At the present time,  we will continue her on metoprolol succinate 25 mg daily.  I have  ordered a GXT.  Should the patient have a normal blood pressure  response, then I think she should be continued on her current strategy.  Should she have a hypotensive response, then we could readdress the  issue of ICD implantation in the future.  I have encouraged the patient  to avoid strenuous isometric exercises.  She will follow up with you as  previously scheduled.   Thank you for the opportunity of participating in the care of Ms. Luera.    Sincerely,      Hillis Range, MD  Electronically Signed    JA/MedQ  DD: 06/23/2008  DT: 06/24/2008  Job #: [id number]

## 2011-01-02 NOTE — Consult Note (Signed)
NAMESERENAH, [name]                 ACCOUNT NO.:  [id number]   MEDICAL RECORD NO.:  [id number]          PATIENT TYPE:  INP   LOCATION:  5530                         FACILITY:  [facility]   PHYSICIAN:  [name] C. Wall, MD, FACCDATE OF BIRTH:  May 26, 1928   DATE OF CONSULTATION:  05/19/2008  DATE OF DISCHARGE:                                 CONSULTATION   PRIMARY CARDIOLOGIST:  New to [facility] being seen by Dr. Daleen Williams,  though she can follow up in [place].   PRIMARY CARE [name] Williams:  [name] Meigs, MD   REQUESTING PHYSICIAN:  [name] Williams. Elnour, MD   PATIENT PROFILE:  A 77 year old Caucasian female without prior cardiac  history who we are asked to consult on secondary to nonsustained  ventricular tachycardia.   PROBLEMS:  1. Asymptomatic nonsustained ventricular tachycardia.  2. Questionable transient ischemic attack.  3. Urinary tract infection.  4. Hypertension.  5. Hyperlipidemia.  6. Gastroesophageal reflux disease.  7. Prolonged QT.  8. Depression.  9. Status post cholecystectomy.  10.Nephrolithiasis.  11.Rectocele.  12.Ongoing tobacco abuse.  13.History of negative stress test at [facility] in 2001.  14.Anxiety and caregiver restrain.   HISTORY OF PRESENT ILLNESS:  This is a 78 year old Caucasian female  without prior cardiac history.  She does report a history of an abnormal  heart beat or EKG, she is not sure and by her report she had a stress  test at [facility] in about 2001 which was normal.  This was done  preoperative cholecystectomy.  She is very active at home taking care of  her husband, exercising 3 times a week, and also dancing to bluegrass  music for 2 hours every Saturday night.  She is able to do these  activities without chest pain, shortness of breath, limitations, or drop-  off in exercise tolerance over the years.  She was admitted on May 17, 2008 following her second episode of facial drooping and amnesia  (the first  occurred on May 12, 2008).  Neuro workup up to this  point has been unrevealing, although she is pending an EEG today.  Last  evening and again this morning, she had episodes of asymptomatic wide  complex tachycardia, 13 and 15 beats respectively.  She has no history  of syncope or palpitations.   ALLERGIES:  No known drug allergies.   CURRENT MEDICATIONS:  1. Xanax 0.25 mg b.i.d.  2. Aspirin 325 mg daily.  3. Rocephin 1 gram IV daily.  4. Plavix 75 mg daily.  5. Cymbalta 30 mg daily.  6. Enoxaparin 40 mg subcu at bedtime.  7. Lovaza 1 gram b.i.d.  8. Protonix 40 mg at bedtime.  9. Simvastatin 20 mg at bedtime.   FAMILY HISTORY:  Mother died at age 81 of old age.  Father died at 18 of  stroke.  She has 3 brothers and 5 sisters.  There is a history of  cancer, Alzheimer's, and stroke.  There is no history of diabetes or  coronary disease in her siblings.   SOCIAL HISTORY:  She lives in [name] with her  husband who she takes  care of secondary to vascular dementia.  They also have a son that lives  with them and by her report he is an alcoholic.  She is retired.  She  has 3 grown children total.  She has been smoking for 60 years and has  been smoking about a pack of cigarettes per week for the past 15 years.  Prior to that, it was less than a pack a week.  She denies alcohol or  drug use.  As noted above, she does exercise 3 days a week and also  dances on Saturday night.   REVIEW OF SYSTEMS:  Positive for a history of caregiver restrain and  anxiety.  She also has a history of facial droop and spells of amnesia  as outlined in the HPI.  Otherwise, all systems reviewed are negative.   PHYSICAL EXAMINATION:  VITAL SIGNS:  Temperature 97.9, heart rate 62,  respirations 20, blood pressure 134/73, and pulse ox 97% on room air.  GENERAL:  Pleasant white female in no acute distress, awake, alert, and  oriented x3.  HEENT:  Normal.  NEURO:  Grossly intact, nonfocal.  SKIN:   Warm and dry with multiple diffuse solar lentigines along the  right side of her face and brow.  NECK:  No bruits or JVD.  LUNGS:  Respirations are regular and unlabored.  Clear to auscultation.  CARDIAC:  Regular S1 and S2.  No S3, S4, or murmurs.  ABDOMEN:  Round, soft, nontender, and nondistended.  Bowel sounds  present x4.  EXTREMITIES:  Warm, dry, and pink with trace bilateral ankle edema.  Dorsalis pedis and posterior tibial pulses 2+ bilaterally.   DIAGNOSTIC DATA:  Chest x-ray on May 17, 2008 showed no active  cardiopulmonary disease.  MRI/AA of the brain on May 18, 2008  showed atrophy and small-vessel disease without acute intracranial  findings or significant proximal stenosis.  MRI/AA of the neck on  May 18, 2008 showed mild carotid bifurcation atherosclerosis  without ICA stenosis.  Echocardiogram was performed on September 29, 209  and the readout is pending.   LABORATORY DATA:  Hemoglobin 14.2, hematocrit 41.8, WBC 12.0, and  platelets 227.  Sodium 138, potassium 4.7, chloride 104, CO2 29, BUN 13,  creatinine 0.95, and glucose 92.  Total bilirubin 1.2, alkaline  phosphatase 68, AST 18, ALT 17, total protein 6.2, and albumin 3.5.  Cardiac markers negative x2.  Calcium 9.0, INR 1.0.  Urine culture  showed greater than 100,000 colonies of E. coli.  Total cholesterol 156,  triglycerides 144, HDL 25, and LDL 102.   ASSESSMENT AND PLAN:  1. Nonsustained ventricular tachycardia.  This was asymptomatic.  Echo      has been done and readout is pending.  We will plan to review.      Provided that ejection fraction is normal, we will arrange      outpatient Myoview given her risk factors of coronary disease      including hypertension, hyperlipidemia, and tobacco.  If ejection      fraction or Williams motion are abnormal, however, we will plan cardiac      catheterization.  Notably, her QT is prolonged at about 480      milliseconds and we will need to keep this  in mind if/when changing      antibiotics to p.o. for treatment of urinary tract infection.  We      will check a TSH and magnesium.  Her electrolytes  were otherwise      within normal limits.  With the baseline heart rate in the 50s to      low 60s, I am not sure she would tolerate a low-dose beta-blocker      at this point.  2. Hypertension, reasonably well-controlled.  She is not on any      agents.  3. Hyperlipidemia.  LDL is 102.  She is on Zocor as well as well      Lovaza.  Her LFTs are within normal limits.  4. ? Transient ischemic attack per Neurology.  On aspirin and Plavix.  5. Urinary tract infection.  On antibiotics per Internal Medicine.  If      changing to p.o., will have to consider an agent that will not      impact her QT.  6. Stress/anxiety.  On Cymbalta and Xanax.  7. Tobacco abuse.  Smoking cessation is strongly advised.      [name] Williams, ANP      Jesse Sans. [name] Squibb, MD, [facility]  Electronically Signed    CB/MEDQ  D:  05/19/2008  T:  05/20/2008  Job:  [id number]

## 2011-01-05 NOTE — Op Note (Signed)
NAME:  [name] Williams, [name] Williams                           ACCOUNT NO.:  [id number]   MEDICAL RECORD NO.:  [id number]                   PATIENT TYPE:  AMB   LOCATION:  DAY                                  FACILITY:  [facility]   PHYSICIAN:  R. Roetta Sessions, M.D.              DATE OF BIRTH:  1928-07-02   DATE OF PROCEDURE:  DATE OF DISCHARGE:                                 OPERATIVE REPORT   PROCEDURE:  Screening colonoscopy and diagnostic esophagogastroduodenoscopy.   ENDOSCOPIST:  Gerrit Friends. Rourk, M.D.   INDICATIONS FOR PROCEDURE:  The patient is a 77 year old lady referred at  the courtesy of Dr. Faustino Congress for screening colonoscopy.  She has been  seeing Dr. Jearld Fenton for apparent lower esophageal reflux.  He contacted [place] and  I was asked if we could perform an EGD as well.  This patient has had only a  few episodes of typical reflux symptoms in the past. She has been on  Prilosec 200 mg daily for some time. No odynophagia, no dysphagia.  She has  never had her upper GI tract evaluated.  EGD with colonoscopy is now being  done. This approach has been discussed with the patient at the bedside.  The  potential risks, benefits, and alternatives have been reviewed, questions  answered. She is agreeable.  ASA II.  Please see my handwritten H&P for more  information.   DESCRIPTION OF PROCEDURE:  O2 saturation, blood pressure, pulse and  respirations were monitored throughout the entire procedure.   CONSCIOUS SEDATION:  Versed 3 mg IV, Demerol 50 mg IV in divided doses.  Cetacaine spray for topical oropharyngeal anesthesia.   INSTRUMENT:  Olympus video chip gastroscope and colonoscope.   ESOPHAGOGASTRODUODENOSCOPY FINDINGS:  Examination of the tubular esophagus  revealed no mucosal abnormalities.  The EG junction was easily traversed.   STOMACH:  The gastric cavity was empty.  It insufflated well with air.  A  thorough examination of the gastric mucosa including a retroflex view of the  proximal  stomach and esophagogastric junction again demonstrated only a  small hiatal hernia.  The pylorus was patent and easily traversed.   DUODENUM:  The bulb and the second portion appeared normal.   THERAPY/DIAGNOSTIC MANEUVERS:  None.   The patient tolerated the procedure well and was prepared for colonoscopy.   COLONOSCOPY FINDINGS:  A digital rectal exam revealed no abnormalities.   ENDOSCOPIC FINDINGS:  The prep was good.   RECTUM:  Examination of the rectal mucosa including the retroflex view of  the anal verge revealed no abnormalities.   COLON:  The colonic mucosa was surveyed from the rectosigmoid junction  through the left transverse and right colon to the area of the appendiceal  orifice, ileocecal valve, and cecum.  These structures were well seen and  photographed for the record.  The patient had a tortuous, elongated colon  with pancolonic diverticula.  The remainder  of the colonic mucosa appeared  normal.   From the level of the cecum and ileocecal valve the scope was slowly and  cautiously withdrawn.  All previously mentioned mucosal surfaces were again  seen; and, again, no other abnormalities were observed.  The patient  tolerated the procedure well and was reacted in endoscopy.   ESOPHAGOGASTRODUODENOSCOPY IMPRESSION:  1. Normal esophagus.  2. Small hiatal hernia.  3. The remainder of stomach and duodenum through the second portion appeared     normal.   COLONOSCOPY IMPRESSION:  1. Normal rectum.  2. Tortuous, elongated colon with pancolonic diverticula.  The remainder of     the mucosa appeared normal.   RECOMMENDATIONS:  1. The patient should continue Prilosec 200 mg orally daily.  Follow up with     Dr. Faustino Congress if there is any further concern about persisting LPR in     spite of acid suppression we could perform an ambulatory esophageal pH     monitoring.  2. Diverticulosis literature provided to the patient.  3. Daily fiber supplementation  emphasize.  4. This patient is urged to follow up with Dr. Faustino Congress and Dr. Jearld Fenton as     directed.                                               Jonathon Bellows, M.D.    RMR/MEDQ  D:  06/12/2002  T:  06/12/2002  Job:  [id number]   cc:   Dr. Collins Scotland   Suzanna Obey, MD  [phone] [place]  [place] [address]  Fax: [phone]

## 2011-05-21 LAB — BASIC METABOLIC PANEL
Chloride: 105
GFR calc Af Amer: >60
Potassium: 4

## 2011-05-21 LAB — CBC
HCT: 41.8
HCT: 42.4
Hemoglobin: 14.2
Hemoglobin: 14.4
MCHC: 34
MCV: 91.2
MCV: 91.4
Platelets: 227
RBC: 4.59
RBC: 4.63
RDW: 12.9
WBC: 11.4 — ABNORMAL HIGH
WBC: 12 — ABNORMAL HIGH

## 2011-05-21 LAB — LIPID PANEL
Cholesterol: 162
HDL: 26 — ABNORMAL LOW
LDL Cholesterol: 102 — ABNORMAL HIGH
Total CHOL/HDL Ratio: 6.2
VLDL: 29
VLDL: 31

## 2011-05-21 LAB — COMPREHENSIVE METABOLIC PANEL
BUN: 10
CO2: 30
Chloride: 103
Creatinine, Ser: 1.01
GFR calc non Af Amer: 53 — ABNORMAL LOW
Glucose, Bld: 127 — ABNORMAL HIGH
Total Bilirubin: 1.2

## 2011-05-21 LAB — BASIC METABOLIC PANEL WITH GFR
BUN: 13
CO2: 29
Calcium: 9
Chloride: 104
Creatinine, Ser: 0.95
GFR calc non Af Amer: 57 — ABNORMAL LOW
Glucose, Bld: 92
Potassium: 4.7
Sodium: 138

## 2011-05-21 LAB — FACTOR 5 LEIDEN

## 2011-05-21 LAB — URINALYSIS, ROUTINE W REFLEX MICROSCOPIC
Bilirubin Urine: NEGATIVE
Ketones, ur: NEGATIVE
Nitrite: NEGATIVE
Nitrite: POSITIVE — AB
Protein, ur: NEGATIVE
Specific Gravity, Urine: 1.016
Urobilinogen, UA: 1

## 2011-05-21 LAB — LUPUS ANTICOAGULANT PANEL
DRVVT: 41.8 (ref 36.1–47.0)
Lupus Anticoagulant: NOT DETECTED

## 2011-05-21 LAB — URINE CULTURE: Colony Count: >=100000

## 2011-05-21 LAB — DIFFERENTIAL
Eosinophils Absolute: 0.1
Eosinophils Relative: 1
Lymphs Abs: 4.2 — ABNORMAL HIGH
Monocytes Relative: 8
Neutrophils Relative %: 52

## 2011-05-21 LAB — HEMOGLOBIN A1C: Mean Plasma Glucose: 126

## 2011-05-21 LAB — URINE MICROSCOPIC-ADD ON

## 2011-05-21 LAB — ANTITHROMBIN III: AntiThromb III Func: 93 (ref 76–126)

## 2011-05-21 LAB — PROTIME-INR: INR: 1

## 2011-05-21 LAB — CARDIAC PANEL(CRET KIN+CKTOT+MB+TROPI)
CK, MB: 3.8
Relative Index: INVALID
Troponin I: 0.02

## 2011-05-21 LAB — HOMOCYSTEINE: Homocysteine: 6.9

## 2011-05-21 LAB — SYPHILIS: RPR W/REFLEX TO RPR TITER AND TREPONEMAL ANTIBODIES, TRADITIONAL SCREENING AND DIAGNOSIS ALGORITHM: RPR Ser Ql: NONREACTIVE

## 2011-05-21 LAB — MAGNESIUM: Magnesium: 2.3

## 2011-05-21 LAB — CK TOTAL AND CKMB (NOT AT ARMC): CK, MB: 3.7

## 2011-05-21 LAB — TROPONIN I: Troponin I: 0.01

## 2011-05-21 LAB — SEDIMENTATION RATE: Sed Rate: 15

## 2011-05-21 LAB — CARDIOLIPIN ANTIBODIES, IGM+IGG
Anticardiolipin IgG: <7 — ABNORMAL LOW
Anticardiolipin IgM: 7 — ABNORMAL LOW

## 2011-11-01 ENCOUNTER — Ambulatory Visit (INDEPENDENT_AMBULATORY_CARE_PROVIDER_SITE_OTHER): Payer: Medicare Other | Admitting: Cardiology

## 2011-11-01 ENCOUNTER — Encounter: Payer: Self-pay | Admitting: Cardiology

## 2011-11-01 DIAGNOSIS — R55 Syncope and collapse: Secondary | ICD-10-CM

## 2011-11-01 DIAGNOSIS — I421 Obstructive hypertrophic cardiomyopathy: Secondary | ICD-10-CM

## 2011-11-01 DIAGNOSIS — G459 Transient cerebral ischemic attack, unspecified: Secondary | ICD-10-CM

## 2011-11-01 DIAGNOSIS — I10 Essential (primary) hypertension: Secondary | ICD-10-CM

## 2011-11-01 DIAGNOSIS — I498 Other specified cardiac arrhythmias: Secondary | ICD-10-CM

## 2011-11-01 DIAGNOSIS — E785 Hyperlipidemia, unspecified: Secondary | ICD-10-CM

## 2011-11-01 NOTE — Assessment & Plan Note (Signed)
Stable. She is on no true cardiac meds. No change in therapy.

## 2011-11-01 NOTE — Patient Instructions (Signed)
Your physician wants you to follow-up in:  12 months.  You will receive a reminder letter in the mail two months in advance. If you don't receive a letter, please call our office to schedule the follow-up appointment.   

## 2011-11-01 NOTE — Progress Notes (Signed)
HPI [name] Williams returns today for evaluation and management of her history of hypertrophic obstructive cardiomyopathy, history of bradycardia, history of syncope.  She has had no symptoms of chest pain, dyspnea on exertion, palpitations no recurrent syncope since 2011. She is on really no cardiac meds except Aggrenox.She does take simvastatin.  No orthopnea, PND or edema. Still very active.  Past Medical History  Diagnosis Date  . Hypertrophic obstructive cardiomyopathy   . Unspecified transient cerebral ischemia   . Esophageal reflux   . Other and unspecified hyperlipidemia   . Essential hypertension, benign   . Depressive disorder, not elsewhere classified     Current Outpatient Prescriptions  Medication Sig Dispense Refill  . dipyridamole-aspirin (AGGRENOX) 25-200 MG per 12 hr capsule Take 2 capsules by mouth daily.        . DULoxetine (CYMBALTA) 60 MG capsule Take 60 mg by mouth daily.        . fish oil-omega-3 fatty acids 1000 MG capsule Take 1 g by mouth daily.        . multivitamin (THERAGRAN) per tablet Take 1 tablet by mouth daily.        . simvastatin (ZOCOR) 20 MG tablet Take 20 mg by mouth at bedtime.        . vitamin B-12 (CYANOCOBALAMIN) 500 MCG tablet Take 500 mcg by mouth 2 (two) times daily.          Allergies  Allergen Reactions  . Latex     Family History  Problem Relation Age of Onset  . Breast cancer Mother   . Stomach cancer Sister   . Uterine cancer Sister     History   Social History  . Marital Status: Married    Spouse Name: N/A    Number of Children: N/A  . Years of Education: N/A   Occupational History  . Not on file.   Social History Main Topics  . Smoking status: Current Some Day Smoker  . Smokeless tobacco: Not on file   Comment: occasionally smokes  . Alcohol Use: No  . Drug Use: No  . Sexually Active: Not on file   Other Topics Concern  . Not on file   Social History Narrative  . No narrative on file    ROS ALL NEGATIVE  EXCEPT THOSE NOTED IN HPI  PE  General Appearance: well developed, well nourished in no acute distress HEENT: symmetrical face, PERRLA, good dentition  Neck: no JVD, thyromegaly, or adenopathy, trachea midline Chest: symmetric without deformity Cardiac: PMI non-displaced, RRR, normal S1, S2, no gallop or murmur Lung: clear to ausculation and percussion Vascular: all pulses full without bruits  Abdominal: nondistended, nontender, good bowel sounds, no HSM, no bruits Extremities: no cyanosis, clubbing or edema, no sign of DVT, no varicosities  Skin: normal color, no rashes Neuro: alert and oriented x 3, non-focal Pysch: normal affect  EKG Normal sinus rhythm, LVH with significant ST segment changes with T-wave inversion, unchanged since previous ECG. BMET    Component Value Date/Time   NA 140 03/31/2010 0845   K 4.3 03/31/2010 0845   CL 106 03/31/2010 0845   CO2 28 03/31/2010 0845   GLUCOSE 107* 03/31/2010 0845   BUN 16 03/31/2010 0845   CREATININE 1.04 03/31/2010 0845   CALCIUM 9.2 03/31/2010 0845   GFRNONAA 51* 03/31/2010 0845   GFRAA  Value: >60        The eGFR has been calculated using the MDRD equation. This calculation has not been validated in all  clinical situations. eGFR's persistently <60 mL/min signify possible Chronic Kidney Disease. 03/31/2010 0845    Lipid Panel     Component Value Date/Time   CHOL  Value: 156        ATP III CLASSIFICATION:  <200     mg/dL   Desirable  161-096  mg/dL   Borderline High  >=045    mg/dL   High 11/26/8117 1478   TRIG 144 05/19/2008 0612   HDL 25* 05/19/2008 0612   CHOLHDL 6.2 05/19/2008 0612   VLDL 29 05/19/2008 0612   LDLCALC  Value: 102        Total Cholesterol/HDL:CHD Risk Coronary Heart Disease Risk Table                     Men   Women  1/2 Average Risk   3.4   3.3* 05/19/2008 0612    CBC    Component Value Date/Time   WBC 9.4 03/31/2010 0845   RBC 4.40 03/31/2010 0845   HGB 14.0 03/31/2010 0845   HCT 41.5 03/31/2010 0845   PLT 166  03/31/2010 0845   MCV 94.3 03/31/2010 0845   MCH 31.8 03/31/2010 0845   MCHC 33.7 03/31/2010 0845   RDW 13.0 03/31/2010 0845   LYMPHSABS 4.3* 03/31/2010 0845   MONOABS 1.0 03/31/2010 0845   EOSABS 0.1 03/31/2010 0845   BASOSABS 0.0 03/31/2010 0845

## 2012-10-23 ENCOUNTER — Other Ambulatory Visit: Payer: Self-pay | Admitting: Neurology

## 2012-10-23 DIAGNOSIS — G459 Transient cerebral ischemic attack, unspecified: Secondary | ICD-10-CM

## 2012-11-06 ENCOUNTER — Other Ambulatory Visit: Payer: Self-pay

## 2012-11-06 ENCOUNTER — Ambulatory Visit (INDEPENDENT_AMBULATORY_CARE_PROVIDER_SITE_OTHER): Payer: Medicare Other | Admitting: Cardiology

## 2012-11-06 ENCOUNTER — Encounter: Payer: Self-pay | Admitting: Cardiology

## 2012-11-06 VITALS — BP 122/68 | HR 75 | Ht 64.0 in | Wt 127.0 lb

## 2012-11-06 DIAGNOSIS — I421 Obstructive hypertrophic cardiomyopathy: Secondary | ICD-10-CM

## 2012-11-06 NOTE — Assessment & Plan Note (Signed)
Stable. Return the office in one year.

## 2012-11-06 NOTE — Progress Notes (Signed)
HPI [name] Williams returns today for evaluation and management of her apical hypertrophic obstructive cardiomyopathy.  She's totally asymptomatic. She's had a history of a stroke and is on Aggrenox. There is no history of atrial arrhythmias however.  She has quit smoking.  Past Medical History  Diagnosis Date  . Hypertrophic obstructive cardiomyopathy   . Unspecified transient cerebral ischemia   . Esophageal reflux   . Other and unspecified hyperlipidemia   . Essential hypertension, benign   . Depressive disorder, not elsewhere classified     Current Outpatient Prescriptions  Medication Sig Dispense Refill  . dipyridamole-aspirin (AGGRENOX) 25-200 MG per 12 hr capsule Take 2 capsules by mouth daily.        Marland Kitchen escitalopram (LEXAPRO) 10 MG tablet Take 10 mg by mouth daily.      . fish oil-omega-3 fatty acids 1000 MG capsule Take 1 g by mouth as needed.       . multivitamin (THERAGRAN) per tablet Take 1 tablet by mouth daily.        . Saw Palmetto, Serenoa repens, (SAW PALMETTO PO) Take by mouth daily.      . simvastatin (ZOCOR) 20 MG tablet Take 20 mg by mouth at bedtime.        . vitamin B-12 (CYANOCOBALAMIN) 500 MCG tablet Take 500 mcg by mouth as needed.        No current facility-administered medications for this visit.    Allergies  Allergen Reactions  . Latex     Family History  Problem Relation Age of Onset  . Breast cancer Mother   . Stomach cancer Sister   . Uterine cancer Sister     History   Social History  . Marital Status: Married    Spouse Name: N/A    Number of Children: N/A  . Years of Education: N/A   Occupational History  . Not on file.   Social History Main Topics  . Smoking status: Former Games developer  . Smokeless tobacco: Not on file     Comment: quit 2012  . Alcohol Use: No  . Drug Use: No  . Sexually Active: Not on file   Other Topics Concern  . Not on file   Social History Narrative  . No narrative on file    ROS ALL NEGATIVE EXCEPT THOSE  NOTED IN HPI  PE  General Appearance: well developed, well nourished in no acute distress HEENT: symmetrical face, PERRLA, good dentition  Neck: no JVD, thyromegaly, or adenopathy, trachea midline Chest: symmetric without deformity Cardiac: PMI non-displaced, RRR, normal S1, S2, no gallop or murmur Lung: clear to ausculation and percussion Vascular: all pulses full without bruits  Abdominal: nondistended, nontender, good bowel sounds, no HSM, no bruits Extremities: no cyanosis, clubbing or edema, no sign of DVT, no varicosities  Skin: normal color, no rashes Neuro: alert and oriented x 3, non-focal Pysch: normal affect  EKG  Normal sinus rhythm, LVH with significant ST-T wave changes in almost every lead.  BMET    Component Value Date/Time   NA 140 03/31/2010 0845   K 4.3 03/31/2010 0845   CL 106 03/31/2010 0845   CO2 28 03/31/2010 0845   GLUCOSE 107* 03/31/2010 0845   BUN 16 03/31/2010 0845   CREATININE 1.04 03/31/2010 0845   CALCIUM 9.2 03/31/2010 0845   GFRNONAA 51* 03/31/2010 0845   GFRAA  Value: >60        The eGFR has been calculated using the MDRD equation. This calculation has not been  validated in all clinical situations. eGFR's persistently <60 mL/min signify possible Chronic Kidney Disease. 03/31/2010 0845    Lipid Panel     Component Value Date/Time   CHOL  Value: 156        ATP III CLASSIFICATION:  <200     mg/dL   Desirable  161-096  mg/dL   Borderline High  >=045    mg/dL   High 11/26/8117 1478   TRIG 144 05/19/2008 0612   HDL 25* 05/19/2008 0612   CHOLHDL 6.2 05/19/2008 0612   VLDL 29 05/19/2008 0612   LDLCALC  Value: 102        Total Cholesterol/HDL:CHD Risk Coronary Heart Disease Risk Table                     Men   Women  1/2 Average Risk   3.4   3.3* 05/19/2008 0612    CBC    Component Value Date/Time   WBC 9.4 03/31/2010 0845   RBC 4.40 03/31/2010 0845   HGB 14.0 03/31/2010 0845   HCT 41.5 03/31/2010 0845   PLT 166 03/31/2010 0845   MCV 94.3 03/31/2010 0845    MCH 31.8 03/31/2010 0845   MCHC 33.7 03/31/2010 0845   RDW 13.0 03/31/2010 0845   LYMPHSABS 4.3* 03/31/2010 0845   MONOABS 1.0 03/31/2010 0845   EOSABS 0.1 03/31/2010 0845   BASOSABS 0.0 03/31/2010 0845

## 2012-11-06 NOTE — Patient Instructions (Addendum)
Your physician recommends that you continue on your current medications as directed. Please refer to the Current Medication list given to you today.  Your physician wants you to follow-up in: 1 year. You will receive a reminder letter in the mail two months in advance. If you don't receive a letter, please call our office to schedule the follow-up appointment.  

## 2013-10-24 ENCOUNTER — Other Ambulatory Visit: Payer: Self-pay | Admitting: Neurology

## 2013-11-27 ENCOUNTER — Other Ambulatory Visit: Payer: Self-pay | Admitting: Neurology

## 2013-12-03 DIAGNOSIS — I639 Cerebral infarction, unspecified: Secondary | ICD-10-CM | POA: Insufficient documentation

## 2013-12-03 DIAGNOSIS — F329 Major depressive disorder, single episode, unspecified: Secondary | ICD-10-CM | POA: Insufficient documentation

## 2013-12-03 DIAGNOSIS — F419 Anxiety disorder, unspecified: Secondary | ICD-10-CM | POA: Insufficient documentation

## 2013-12-03 DIAGNOSIS — K219 Gastro-esophageal reflux disease without esophagitis: Secondary | ICD-10-CM | POA: Insufficient documentation

## 2013-12-03 DIAGNOSIS — F32A Depression, unspecified: Secondary | ICD-10-CM | POA: Insufficient documentation

## 2014-01-07 DIAGNOSIS — R5383 Other fatigue: Secondary | ICD-10-CM | POA: Insufficient documentation

## 2014-01-14 ENCOUNTER — Other Ambulatory Visit: Payer: Self-pay | Admitting: Neurology

## 2014-01-27 ENCOUNTER — Encounter: Payer: Self-pay | Admitting: *Deleted

## 2014-01-28 ENCOUNTER — Ambulatory Visit (INDEPENDENT_AMBULATORY_CARE_PROVIDER_SITE_OTHER): Payer: Medicare Other | Admitting: Nurse Practitioner

## 2014-01-28 ENCOUNTER — Encounter: Payer: Self-pay | Admitting: Nurse Practitioner

## 2014-01-28 VITALS — BP 125/70 | HR 57 | Ht 64.0 in | Wt 135.0 lb

## 2014-01-28 DIAGNOSIS — R413 Other amnesia: Secondary | ICD-10-CM | POA: Insufficient documentation

## 2014-01-28 DIAGNOSIS — G459 Transient cerebral ischemic attack, unspecified: Secondary | ICD-10-CM

## 2014-01-28 MED ORDER — ASPIRIN-DIPYRIDAMOLE ER 25-200 MG PO CP12: 1.0000 | ORAL_CAPSULE | Freq: Two times a day (BID) | ORAL | Status: DC

## 2014-01-28 NOTE — Patient Instructions (Signed)
Continue Aggrenox for secondary stroke prevention, will renew Check carotid Doppler Daily exercise by walking, healthy diet Followup yearly and when necessary

## 2014-01-28 NOTE — Progress Notes (Signed)
[facility]  PATIENT: [name] Williams DOB: Nov 30, 1927   REASON FOR VISIT: follow up for TIA  HISTORY OF PRESENT ILLNESS:[name] Williams, 80 year old white female returns today for followup. She was last seen 09/11/12.   She has a history of brief episodes of unawareness, slurring of speech and left facial droop, possible posterior circulatory TIAs. Her episodes  last for a couple of hours with improvement, she has had no episodes in over 4 yrs.  She denies any headaches. EEGs have been normal. There is no prior history of stroke or seizure or significant head injury with loss of consciousness. CT also shows mild age related atrophy. MRI of the brain showed no definite stroke. 2-D echo has been unremarkable. Carotid Dopplers have shown only mild stenosis. She returns today doing well, she remains on Aggrenox. She denies any side effects. Husband  died several years ago  from cancer.  She remains independent in all activities of daily living, continues to drive. She exercises [facility] [employer] [facility]. No new neurological complaints.  He continues to have mild short-term memory difficulties which appear to be unchanged. She does not report any new interval medical problems.   REVIEW OF SYSTEMS: Full 14 system review of systems performed and notable only for those listed, all others are neg:  Constitutional: N/A  Cardiovascular: N/A  Ear/Nose/Throat: N/A  Skin: N/A  Eyes: N/A  Respiratory: N/A  Gastroitestinal: N/A  Hematology/Lymphatic: N/A  Endocrine: N/A Musculoskeletal:N/A  Allergy/Immunology: N/A  Neurological: N/A Psychiatric: N/A Sleep : NA   ALLERGIES: Allergies  Allergen Reactions  . Latex     HOME MEDICATIONS: Outpatient Prescriptions Prior to Visit  Medication Sig Dispense Refill  . AGGRENOX 25-200 MG per 12 hr capsule TAKE ONE TWICE DAILY  30 capsule  0  . escitalopram (LEXAPRO) 10 MG tablet Take 10 mg by mouth daily.      . multivitamin  (THERAGRAN) per tablet Take 1 tablet by mouth daily.        . simvastatin (ZOCOR) 20 MG tablet Take 20 mg by mouth at bedtime.        . vitamin B-12 (CYANOCOBALAMIN) 500 MCG tablet Take 500 mcg by mouth as needed.       . fish oil-omega-3 fatty acids 1000 MG capsule Take 1 g by mouth as needed.       . Saw Palmetto, Serenoa repens, (SAW PALMETTO PO) Take by mouth daily.       No facility-administered medications prior to visit.    PAST MEDICAL HISTORY: Past Medical History  Diagnosis Date  . Hypertrophic obstructive cardiomyopathy   . Unspecified transient cerebral ischemia   . Esophageal reflux   . Other and unspecified hyperlipidemia   . Essential hypertension, benign   . Depressive disorder, not elsewhere classified     PAST SURGICAL HISTORY: Past Surgical History  Procedure Laterality Date  . Cervix surgery    . Bladder repair    . Cholecystectomy    . Tonsillectomy    . Tubal ligation      FAMILY HISTORY: Family History  Problem Relation Age of Onset  . Breast cancer Mother   . Stomach cancer Sister   . Uterine cancer Sister     SOCIAL HISTORY: History   Social History  . Marital Status: Married    Spouse Name: N/A    Number of Children: 3  . Years of Education: N/A   Occupational History  . Not on file.  Social History Main Topics  . Smoking status: Former Research scientist (life sciences)  . Smokeless tobacco: Never Used     Comment: quit 2012  . Alcohol Use: No  . Drug Use: No  . Sexual Activity: Not on file   Other Topics Concern  . Not on file   Social History Narrative   Patient is retired.    Patient is widowed.    Patient has 3 children.    Patient is right handed.      PHYSICAL EXAM  Filed Vitals:   01/28/14 1410  BP: 125/70  Pulse: 57  Height: 5\' 4"  (1.626 m)  Weight: 135 lb (61.236 kg)   Body mass index is 23.16 kg/(m^2).  Generalized: Well developed, in no acute distress  Head: normocephalic and atraumatic,. Oropharynx benign  Neck: Supple, no  carotid bruits  Cardiac: Regular rate rhythm, no murmur  Musculoskeletal: No deformity   Neurological examination   Mentation: Alert oriented to time, place, history taking. MMSE 22/30. AFT 16. Follows all commands speech and language fluent  Cranial nerve II-XII: .Pupils were equal round reactive to light extraocular movements were full, visual field were full on confrontational test. Facial sensation and strength were normal. hearing was intact to finger rubbing bilaterally. Uvula tongue midline. head turning and shoulder shrug were normal and symmetric.Tongue protrusion into cheek strength was normal. Motor: normal bulk and tone, full strength in the BUE, BLE, fine finger movements normal, no pronator drift. No focal weakness Sensory: normal and symmetric to light touch, pinprick, and  vibration  Coordination: finger-nose-finger, heel-to-shin bilaterally, no dysmetria Reflexes: 1+ upper lower and symmetric, plantar responses were flexor bilaterally. Gait and Station: Rising up from seated position without assistance, normal stance,  moderate stride, good arm swing, smooth turning, able to perform tiptoe, and heel walking without difficulty. Tandem gait is steady, no assistive device DIAGNOSTIC DATA (LABS, IMAGING, TESTING) -   ASSESSMENT AND PLAN  80 y.o. year old female  has a past medical history of episodes of altered awareness with slurred  speech and left facial droop. These are felt to represent posterior circulatory TIAs. No episodes in 4 years.   Continue Aggrenox for secondary stroke prevention, will renew Check carotid Doppler (did not get this done after last visit) Daily exercise by walking, healthy diet Followup yearly and when necessary Dennie Bible, [facility], [facility], APRN  [facility] [address], [place], [place] [address] [phone]

## 2014-02-01 NOTE — Progress Notes (Signed)
I agree with above 

## 2014-02-12 ENCOUNTER — Ambulatory Visit (INDEPENDENT_AMBULATORY_CARE_PROVIDER_SITE_OTHER): Payer: Medicare Other

## 2014-02-12 DIAGNOSIS — G459 Transient cerebral ischemic attack, unspecified: Secondary | ICD-10-CM

## 2014-02-23 ENCOUNTER — Telehealth: Payer: Self-pay | Admitting: Nurse Practitioner

## 2014-02-23 NOTE — Telephone Encounter (Signed)
Called pt to inform her per Hoyle Sauer, NP that the pt's carotid doppler was negative for significant stenosis of extracranial carotid arteries. I advised the pt that if she has any other problems, questions or concerns to call the office. Pt verbalized understanding.

## 2014-02-23 NOTE — Telephone Encounter (Signed)
Carotid doppler negative for significant stenosis of extracranial carotid arteries. Please call the patient

## 2014-03-19 ENCOUNTER — Telehealth: Payer: Self-pay | Admitting: *Deleted

## 2014-03-19 NOTE — Telephone Encounter (Signed)
I called pt and gave her the results of the carotid doppler, normal study.  She verbalized understanding.

## 2014-04-08 DIAGNOSIS — L080 Pyoderma: Secondary | ICD-10-CM | POA: Insufficient documentation

## 2014-04-15 DIAGNOSIS — F5101 Primary insomnia: Secondary | ICD-10-CM | POA: Insufficient documentation

## 2014-07-07 ENCOUNTER — Encounter: Payer: Self-pay | Admitting: Neurology

## 2014-07-13 ENCOUNTER — Encounter: Payer: Self-pay | Admitting: Neurology

## 2014-10-06 ENCOUNTER — Ambulatory Visit (INDEPENDENT_AMBULATORY_CARE_PROVIDER_SITE_OTHER): Payer: Medicare Other | Admitting: Family

## 2014-10-06 ENCOUNTER — Encounter: Payer: Self-pay | Admitting: Family

## 2014-10-06 VITALS — BP 134/85 | HR 96 | Temp 97.6°F | Ht 64.0 in | Wt 129.8 lb

## 2014-10-06 DIAGNOSIS — Z1321 Encounter for screening for nutritional disorder: Secondary | ICD-10-CM

## 2014-10-06 DIAGNOSIS — Z13820 Encounter for screening for osteoporosis: Secondary | ICD-10-CM

## 2014-10-06 DIAGNOSIS — F411 Generalized anxiety disorder: Secondary | ICD-10-CM

## 2014-10-06 DIAGNOSIS — E785 Hyperlipidemia, unspecified: Secondary | ICD-10-CM

## 2014-10-06 DIAGNOSIS — F32A Depression, unspecified: Secondary | ICD-10-CM

## 2014-10-06 DIAGNOSIS — F329 Major depressive disorder, single episode, unspecified: Secondary | ICD-10-CM

## 2014-10-06 DIAGNOSIS — K219 Gastro-esophageal reflux disease without esophagitis: Secondary | ICD-10-CM

## 2014-10-06 MED ORDER — ALPRAZOLAM 0.25 MG PO TABS: 0.2500 mg | ORAL_TABLET | Freq: Every evening | ORAL | Status: DC | PRN

## 2014-10-06 NOTE — Patient Instructions (Signed)

## 2014-10-06 NOTE — Progress Notes (Signed)
Subjective:    Patient ID: [name] Williams, female    DOB: 01/20/1928, 81 y.o.   MRN: [id number]  Hyperlipidemia This is a chronic problem. The current episode started more than 1 year ago. The problem is uncontrolled. Recent lipid tests were reviewed and are high. She has no history of diabetes or hypothyroidism. Pertinent negatives include no leg pain, myalgias or shortness of breath. Current antihyperlipidemic treatment includes statins. The current treatment provides moderate improvement of lipids. Risk factors for coronary artery disease include dyslipidemia and post-menopausal.  Gastrophageal Reflux She complains of choking, coughing, heartburn and a hoarse voice. She reports no belching or no sore throat. This is a chronic problem. The current episode started more than 1 year ago. The problem occurs frequently. The problem has been waxing and waning. The heartburn does not wake her from sleep. The symptoms are aggravated by certain foods and lying down. She has tried a PPI for the symptoms. The treatment provided significant relief.  Anxiety Presents for follow-up visit. Symptoms include depressed mood, excessive worry, insomnia, irritability, nervous/anxious behavior and panic. Patient reports no palpitations or shortness of breath. Symptoms occur occasionally. The symptoms are aggravated by family issues.   Her past medical history is significant for anxiety/panic attacks and depression. Past treatments include SSRIs.      Review of Systems  Constitutional: Positive for irritability.  HENT: Positive for hoarse voice. Negative for sore throat.   Eyes: Negative.   Respiratory: Positive for cough and choking. Negative for shortness of breath.   Cardiovascular: Negative.  Negative for palpitations.  Gastrointestinal: Positive for heartburn.  Endocrine: Negative.   Genitourinary: Negative.   Musculoskeletal: Negative.  Negative for myalgias.  Neurological: Negative.  Negative for  headaches.  Hematological: Negative.   Psychiatric/Behavioral: The patient is nervous/anxious and has insomnia.   All other systems reviewed and are negative.      Objective:   Physical Exam  Constitutional: She is oriented to person, place, and time. She appears well-developed and well-nourished. No distress.  HENT:  Head: Normocephalic and atraumatic.  Right Ear: External ear normal.  Left Ear: External ear normal.  Nose: Nose normal.  Mouth/Throat: Oropharynx is clear and moist.  Eyes: Pupils are equal, round, and reactive to light.  Neck: Normal range of motion. Neck supple. No thyromegaly present.  Cardiovascular: Normal rate, regular rhythm, normal heart sounds and intact distal pulses.   No murmur heard. Pulmonary/Chest: Effort normal and breath sounds normal. No respiratory distress. She has no wheezes.  Abdominal: Soft. Bowel sounds are normal. She exhibits no distension. There is no tenderness.  Musculoskeletal: Normal range of motion. She exhibits no edema or tenderness.  Neurological: She is alert and oriented to person, place, and time. She has normal reflexes. No cranial nerve deficit.  Skin: Skin is warm and dry.  Psychiatric: She has a normal mood and affect. Her behavior is normal. Judgment and thought content normal.  Vitals reviewed.   BP 134/85 mmHg  Pulse 96  Temp(Src) 97.6 F (36.4 C) (Oral)  Ht 5' 4" (1.626 m)  Wt 129 lb 12.8 oz (58.877 kg)  BMI 22.27 kg/m2       Assessment & Plan:  1. Gastroesophageal reflux disease, esophagitis presence not specified - CMP14+EGFR  2. Depression - CMP14+EGFR - ALPRAZolam (XANAX) 0.25 MG tablet; Take 1 tablet (0.25 mg total) by mouth at bedtime as needed for anxiety.  Dispense: 30 tablet; Refill: 3  3. Hyperlipidemia - CMP14+EGFR - Lipid panel  4. GAD (generalized  anxiety disorder) - CMP14+EGFR - ALPRAZolam (XANAX) 0.25 MG tablet; Take 1 tablet (0.25 mg total) by mouth at bedtime as needed for anxiety.   Dispense: 30 tablet; Refill: 3  5. Encounter for vitamin deficiency screening - CMP14+EGFR - Vit D  25 hydroxy (rtn osteoporosis monitoring)  6. Osteoporosis screening - DG Bone Density; Future   Continue all meds Labs pending Health Maintenance reviewed Diet and exercise encouraged RTO 3 month  Evelina Dun, FNP

## 2014-10-07 LAB — VITAMIN D 25 HYDROXY (VIT D DEFICIENCY, FRACTURES): Vit D, 25-Hydroxy: 33.3 ng/mL (ref 30.0–100.0)

## 2014-10-07 LAB — CMP14+EGFR
ALT: 13 IU/L (ref 0–32)
AST: 20 IU/L (ref 0–40)
Albumin/Globulin Ratio: 2 (ref 1.1–2.5)
Albumin: 4.5 g/dL (ref 3.5–4.7)
Alkaline Phosphatase: 65 IU/L (ref 39–117)
BILIRUBIN TOTAL: 0.5 mg/dL (ref 0.0–1.2)
BUN/Creatinine Ratio: 16 (ref 11–26)
BUN: 22 mg/dL (ref 8–27)
CO2: 27 mmol/L (ref 18–29)
Calcium: 9.7 mg/dL (ref 8.7–10.3)
Chloride: 99 mmol/L (ref 97–108)
Creatinine, Ser: 1.38 mg/dL — ABNORMAL HIGH (ref 0.57–1.00)
GFR calc non Af Amer: 35 mL/min/{1.73_m2} — ABNORMAL LOW (ref >59)
GFR, EST AFRICAN AMERICAN: 40 mL/min/{1.73_m2} — AB (ref >59)
Globulin, Total: 2.3 g/dL (ref 1.5–4.5)
Glucose: 103 mg/dL — ABNORMAL HIGH (ref 65–99)
POTASSIUM: 5 mmol/L (ref 3.5–5.2)
Sodium: 140 mmol/L (ref 134–144)
Total Protein: 6.8 g/dL (ref 6.0–8.5)

## 2014-10-07 LAB — LIPID PANEL
CHOL/HDL RATIO: 2.6 ratio (ref 0.0–4.4)
Cholesterol, Total: 133 mg/dL (ref 100–199)
HDL: 51 mg/dL (ref >39)
LDL CALC: 60 mg/dL (ref 0–99)
Triglycerides: 110 mg/dL (ref 0–149)
VLDL Cholesterol Cal: 22 mg/dL (ref 5–40)

## 2014-10-20 ENCOUNTER — Other Ambulatory Visit: Payer: Medicare Other

## 2014-10-20 ENCOUNTER — Ambulatory Visit: Payer: Medicare Other

## 2014-10-20 ENCOUNTER — Other Ambulatory Visit: Payer: Self-pay | Admitting: Family

## 2014-10-20 DIAGNOSIS — Z780 Asymptomatic menopausal state: Secondary | ICD-10-CM

## 2014-11-10 ENCOUNTER — Encounter (HOSPITAL_COMMUNITY): Payer: Self-pay | Admitting: Psychiatry

## 2014-11-10 ENCOUNTER — Ambulatory Visit (INDEPENDENT_AMBULATORY_CARE_PROVIDER_SITE_OTHER): Payer: 59 | Admitting: Psychiatry

## 2014-11-10 VITALS — BP 138/80 | HR 76 | Ht 64.0 in | Wt 127.4 lb

## 2014-11-10 DIAGNOSIS — F321 Major depressive disorder, single episode, moderate: Secondary | ICD-10-CM

## 2014-11-10 DIAGNOSIS — F411 Generalized anxiety disorder: Secondary | ICD-10-CM

## 2014-11-10 MED ORDER — BUPROPION HCL ER (XL) 150 MG PO TB24: 150.0000 mg | ORAL_TABLET | ORAL | Status: DC

## 2014-11-10 MED ORDER — MIRTAZAPINE 30 MG PO TABS: 30.0000 mg | ORAL_TABLET | Freq: Every day | ORAL | Status: DC

## 2014-11-10 NOTE — Progress Notes (Signed)
Psychiatric Assessment Adult  Patient Identification:  [name] Williams Date of Evaluation:  11/10/2014 Chief Complaint: I've been really stressed and couldn't eat  History of Chief Complaint:   Chief Complaint  Patient presents with  . Depression  . Anxiety  . Follow-up    HPI this patient is an 81 year old widowed white female who lives with her son in [place]. She has one other son and a daughter as well as 5 grandchildren. 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 She is self-referred but goes to [facility] for her medical care.  The patient states that she's been in good health most of her life and has worked very hard both the tobacco fields and [employer]. 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 She states that her daughter-in-law started working there in a paid position. The patient's husband died in 2009-11-28 after lengthy illness. The patient decided after this to take [employer] the funds she had put in her son and daughter-in-law's name back into her own name. This is when the problem started. She thinks her daughter-in-law became angry about this and started to retaliate against her. She claims the daughter-in-law made up stories about her being out of control and agitated and rude to patrons of the nutrition site. She finally got a letter in November 2014 from the Department of aging telling her that she was no longer allowed to volunteer there.  She states that this "devastated me" all of her friends attended the site and she had been there 20 years without incident. She was unable to sleep or eat. She lost more than 30 pounds. This went on for about a year and she eventually went to [facility] last fall and was admitted for a couple of days. The doctor there put her on mirtazapine 15 mg daily at bedtime which she claims "saved my life" because she started to eat and sleep again. She is also on  Wellbutrin which he thinks has helped. For a while she took Ativan and Xanax but no longer takes these.  The patient states that she decided to come to behavioral health because the problem "still eats away at me." She still doesn't talk to her daughter-in-law and his strained relationship with her's son who is married to her. She still has some trouble sleeping and only sleeps 5-6 hours a night. Is still difficult for her to eat much and she drinks an sure when she can. She has not regained much weight. She is somewhat depressed but keeps on going to her exercise classes and is very active but she claims she has to "make myself" she denies having panic attacks but is anxious. She denies suicidal ideation or any psychotic symptoms and is very bright and alert and her memory is good. Her energy is lower than it used to be. She does have a boyfriend and they spend time together each day. She is also sad because many of her friends have died or gone into nursing homes Review of Systems  Constitutional: Positive for activity change, appetite change and unexpected weight change.  HENT: Negative.   Respiratory: Negative.   Cardiovascular: Negative.   Gastrointestinal: Positive for nausea and diarrhea.  Endocrine: Negative.   Genitourinary: Negative.   Musculoskeletal: Negative.   Skin: Negative.   Allergic/Immunologic: Negative.   Neurological: Negative.   Hematological: Negative.   Psychiatric/Behavioral: Positive for sleep disturbance  and dysphoric mood. The patient is nervous/anxious.    Physical Exam not done  Depressive Symptoms: depressed mood, anhedonia, insomnia, psychomotor retardation, anxiety, loss of energy/fatigue,  (Hypo) Manic Symptoms:   Elevated Mood:  No Irritable Mood:  No Grandiosity:  No Distractibility:  No Labiality of Mood:  No Delusions:  No Hallucinations:  No Impulsivity:  No Sexually Inappropriate Behavior:  No Financial Extravagance:  No Flight of Ideas:   No  Anxiety Symptoms: Excessive Worry:  Yes Panic Symptoms:  No Agoraphobia:  No Obsessive Compulsive: No  Symptoms: None, Specific Phobias:  No Social Anxiety:  No  Psychotic Symptoms:  Hallucinations: No None Delusions:  No Paranoia:  No   Ideas of Reference:  No  PTSD Symptoms: Ever had a traumatic exposure:  No Had a traumatic exposure in the last month:  No Re-experiencing: No None Hypervigilance:  No Hyperarousal: No None Avoidance: No None  Traumatic Brain Injury: No  Past Psychiatric History: Diagnosis: Depression and anxiety   Hospitalizations: none  Outpatient Care: none except medication management by primary care   Substance Abuse Care: none  Self-Mutilation: none  Suicidal Attempts:none  Violent Behaviors: none   Past Medical History:   Past Medical History  Diagnosis Date  . Hypertrophic obstructive cardiomyopathy   . Unspecified transient cerebral ischemia   . Esophageal reflux   . Other and unspecified hyperlipidemia   . Essential hypertension, benign   . Depressive disorder, not elsewhere classified   . Anxiety    History of Loss of Consciousness:  No Seizure History:  No Cardiac History:  No Allergies:   Allergies  Allergen Reactions  . Latex     Per pt 11-10-14 she do not remember being allergic to this.../or   Current Medications:  Current Outpatient Prescriptions  Medication Sig Dispense Refill  . ALPRAZolam (XANAX) 0.25 MG tablet Take 1 tablet (0.25 mg total) by mouth at bedtime as needed for anxiety. 30 tablet 3  . aspirin 81 MG EC tablet Take 81 mg by mouth.    . fenofibrate 160 MG tablet Take 160 mg by mouth.    . Multiple Vitamin tablet Take by mouth daily.    Marland Kitchen omeprazole (PRILOSEC) 20 MG capsule Take 20 mg by mouth 2 (two) times daily.    . simvastatin (ZOCOR) 20 MG tablet Take 20 mg by mouth at bedtime.      . vitamin B-12 (CYANOCOBALAMIN) 500 MCG tablet Take 500 mcg by mouth as needed.     Marland Kitchen buPROPion (WELLBUTRIN XL) 150  MG 24 hr tablet Take 1 tablet (150 mg total) by mouth every morning. 30 tablet 2  . mirtazapine (REMERON) 30 MG tablet Take 1 tablet (30 mg total) by mouth at bedtime. 30 tablet 2   No current facility-administered medications for this visit.    Previous Psychotropic Medications:  Medication Dose   Wellbutrin XL, Remeron, Xanax                        Substance Abuse History in the last 12 months: Substance Age of 1st Use Last Use Amount Specific Type  Nicotine      Alcohol      Cannabis      Opiates      Cocaine      Methamphetamines      LSD      Ecstasy      Benzodiazepines      Caffeine      Inhalants  Others:                          Medical Consequences of Substance Abuse: none  Legal Consequences of Substance Abuse: none  Family Consequences of Substance Abuse: none  Blackouts:  No DT's:  No Withdrawal Symptoms:  No None  Social History: Current Place of Residence: [place] of Birth: [place] Family Members: 6 living siblings, 3 children, 5 grandchildren Marital Status:  Widowed Children:   Sons: 2  Daughters: 1 Relationships: Has a steady boyfriend Education:  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 Legal History: none Hobbies/Interests: Exercise class church friends  Family History:   Family History  Problem Relation Age of Onset  . Breast cancer Mother   . Stomach cancer Sister   . Uterine cancer Sister   . Depression Sister     Mental Status Examination/Evaluation: Objective:  Appearance: Casual, Neat and Well Groomed  Eye Contact::  Good  Speech:  Clear and Coherent  Volume:  Normal  Mood:  Anxious and visibly upset about the situation she is in   Affect:  Depressed and Tearful   Thought Process:  Circumstantial and Goal Directed  Orientation:  Full (Time, Place, and Person)  Thought Content:  Rumination  Suicidal Thoughts:  No  Homicidal Thoughts:  No  Judgement:  Good  Insight:  Fair  Psychomotor Activity:  Normal  Akathisia:  No  Handed:  Right  AIMS (if indicated):    Assets:  Communication Skills Desire for Improvement Physical Health Resilience Social Support    Laboratory/X-Ray Psychological Evaluation(s)   Recent labs are all within normal limits      Assessment:  Axis I: Generalized Anxiety Disorder and Major Depression, single episode  AXIS I Generalized Anxiety Disorder and Major Depression, single episode  AXIS II Deferred  AXIS III Past Medical History  Diagnosis Date  . Hypertrophic obstructive cardiomyopathy   . Unspecified transient cerebral ischemia   . Esophageal reflux   . Other and unspecified hyperlipidemia   . Essential hypertension, benign   . Depressive disorder, not elsewhere classified   . Anxiety      AXIS IV other psychosocial or environmental problems  AXIS V 51-60 moderate symptoms   Treatment Plan/Recommendations:  Plan of Care: Medication management   Laboratory:   Psychotherapy: She will be assigned a counselor here   Medications: she'll continue Wellbutrin XL at 150 mg every morning she does not usually take the second dose anyway. She'll increase mirtazapine to 80 mg daily at bedtime to help with anxiety sleep and appetite   Routine PRN Medications:  No  Consultations:   Safety Concerns:  She denies thoughts of harm to self or others   Other: She'll return in 4 weeks     Levonne Spiller, MD 3/23/201610:25 AM

## 2014-11-24 ENCOUNTER — Encounter: Payer: Self-pay | Admitting: Family

## 2014-11-24 ENCOUNTER — Ambulatory Visit (INDEPENDENT_AMBULATORY_CARE_PROVIDER_SITE_OTHER): Payer: Medicare Other | Admitting: Family

## 2014-11-24 VITALS — BP 142/83 | HR 78 | Temp 97.6°F | Ht 64.0 in | Wt 129.0 lb

## 2014-11-24 DIAGNOSIS — F411 Generalized anxiety disorder: Secondary | ICD-10-CM | POA: Diagnosis not present

## 2014-11-24 MED ORDER — ALPRAZOLAM 0.5 MG PO TABS: 0.5000 mg | ORAL_TABLET | Freq: Two times a day (BID) | ORAL | Status: DC | PRN

## 2014-11-24 NOTE — Patient Instructions (Signed)
Generalized Anxiety Disorder Generalized anxiety disorder (GAD) is a mental disorder. It interferes with life functions, including relationships, work, and school. GAD is different from normal anxiety, which everyone experiences at some point in their lives in response to specific life events and activities. Normal anxiety actually helps us prepare for and get through these life events and activities. Normal anxiety goes away after the event or activity is over.  GAD causes anxiety that is not necessarily related to specific events or activities. It also causes excess anxiety in proportion to specific events or activities. The anxiety associated with GAD is also difficult to control. GAD can vary from mild to severe. People with severe GAD can have intense waves of anxiety with physical symptoms (panic attacks).  SYMPTOMS The anxiety and worry associated with GAD are difficult to control. This anxiety and worry are related to many life events and activities and also occur more days than not for 6 months or longer. People with GAD also have three or more of the following symptoms (one or more in children):  Restlessness.   Fatigue.  Difficulty concentrating.   Irritability.  Muscle tension.  Difficulty sleeping or unsatisfying sleep. DIAGNOSIS GAD is diagnosed through an assessment by your health care provider. Your health care provider will ask you questions aboutyour mood,physical symptoms, and events in your life. Your health care provider may ask you about your medical history and use of alcohol or drugs, including prescription medicines. Your health care provider may also do a physical exam and blood tests. Certain medical conditions and the use of certain substances can cause symptoms similar to those associated with GAD. Your health care provider may refer you to a mental health specialist for further evaluation. TREATMENT The following therapies are usually used to treat GAD:    Medication. Antidepressant medication usually is prescribed for long-term daily control. Antianxiety medicines may be added in severe cases, especially when panic attacks occur.   Talk therapy (psychotherapy). Certain types of talk therapy can be helpful in treating GAD by providing support, education, and guidance. A form of talk therapy called cognitive behavioral therapy can teach you healthy ways to think about and react to daily life events and activities.  Stress managementtechniques. These include yoga, meditation, and exercise and can be very helpful when they are practiced regularly. A mental health specialist can help determine which treatment is best for you. Some people see improvement with one therapy. However, other people require a combination of therapies. Document Released: 12/01/2012 Document Revised: 12/21/2013 Document Reviewed: 12/01/2012 ExitCare Patient Information 2015 [employer]. This information is not intended to replace advice given to you by your health care provider. Make sure you discuss any questions you have with your health care provider.  

## 2014-11-24 NOTE — Progress Notes (Signed)
   Subjective:    Patient ID: [name] Williams, female    DOB: 1928/03/30, 81 y.o.   MRN: [id number]  Anxiety Presents for initial visit. Onset was 1 to 5 years ago. The problem has been waxing and waning. Symptoms include depressed mood, excessive worry, insomnia, nervous/anxious behavior and restlessness. Patient reports no palpitations, panic or shortness of breath. Symptoms occur most days. The severity of symptoms is moderate. The symptoms are aggravated by family issues and medication. The quality of sleep is fair. Nighttime awakenings: occasional.   Her past medical history is significant for anxiety/panic attacks and depression.      Review of Systems  Constitutional: Negative.   HENT: Negative.   Eyes: Negative.   Respiratory: Negative.  Negative for shortness of breath.   Cardiovascular: Negative.  Negative for palpitations.  Gastrointestinal: Negative.   Endocrine: Negative.   Genitourinary: Negative.   Musculoskeletal: Negative.   Neurological: Negative.  Negative for headaches.  Hematological: Negative.   Psychiatric/Behavioral: The patient is nervous/anxious and has insomnia.   All other systems reviewed and are negative.      Objective:   Physical Exam  Constitutional: She is oriented to person, place, and time. She appears well-developed and well-nourished. No distress.  HENT:  Head: Normocephalic.  Eyes: Pupils are equal, round, and reactive to light.  Neck: Normal range of motion. Neck supple. No thyromegaly present.  Cardiovascular: Normal rate, regular rhythm, normal heart sounds and intact distal pulses.   No murmur heard. Pulmonary/Chest: Effort normal and breath sounds normal. No respiratory distress. She has no wheezes.  Abdominal: Soft. Bowel sounds are normal. She exhibits no distension. There is no tenderness.  Musculoskeletal: Normal range of motion. She exhibits no edema or tenderness.  Neurological: She is alert and oriented to person, place, and  time. She has normal reflexes. No cranial nerve deficit.  Skin: Skin is warm and dry.  Psychiatric: Her behavior is normal. Judgment and thought content normal. Her mood appears anxious.  Pt is tearful when she starts talking about her stress with her daughter-in-law  Vitals reviewed.     BP 142/83 mmHg  Pulse 78  Temp(Src) 97.6 F (36.4 C) (Oral)  Ht 5\' 4"  (1.626 m)  Wt 129 lb (58.514 kg)  BMI 22.13 kg/m2     Assessment & Plan:  1. GAD (generalized anxiety disorder) -Stress management discussed -Keep appt with therapists -Discussed medications with patients and reviewed her medications and what each medication is indicated for -RTO prn - ALPRAZolam (XANAX) 0.5 MG tablet; Take 1 tablet (0.5 mg total) by mouth 2 (two) times daily as needed for anxiety.  Dispense: 60 tablet; Refill: [place], FNP

## 2014-11-30 DIAGNOSIS — H2513 Age-related nuclear cataract, bilateral: Secondary | ICD-10-CM | POA: Diagnosis not present

## 2014-11-30 DIAGNOSIS — H40033 Anatomical narrow angle, bilateral: Secondary | ICD-10-CM | POA: Diagnosis not present

## 2014-12-06 ENCOUNTER — Encounter (HOSPITAL_COMMUNITY): Payer: Self-pay | Admitting: Psychiatry

## 2014-12-06 ENCOUNTER — Ambulatory Visit (INDEPENDENT_AMBULATORY_CARE_PROVIDER_SITE_OTHER): Payer: 59 | Admitting: Psychiatry

## 2014-12-06 DIAGNOSIS — F321 Major depressive disorder, single episode, moderate: Secondary | ICD-10-CM

## 2014-12-06 NOTE — Patient Instructions (Signed)
Discussed orally 
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
## 2014-12-08 ENCOUNTER — Encounter (HOSPITAL_COMMUNITY): Payer: Self-pay | Admitting: Psychiatry

## 2014-12-08 ENCOUNTER — Ambulatory Visit (INDEPENDENT_AMBULATORY_CARE_PROVIDER_SITE_OTHER): Payer: 59 | Admitting: Psychiatry

## 2014-12-08 VITALS — BP 110/60 | Ht 64.0 in | Wt 130.0 lb

## 2014-12-08 DIAGNOSIS — F411 Generalized anxiety disorder: Secondary | ICD-10-CM

## 2014-12-08 DIAGNOSIS — F321 Major depressive disorder, single episode, moderate: Secondary | ICD-10-CM | POA: Diagnosis not present

## 2014-12-08 MED ORDER — MIRTAZAPINE 30 MG PO TABS: 30.0000 mg | ORAL_TABLET | Freq: Every day | ORAL | Status: DC

## 2014-12-08 MED ORDER — BUPROPION HCL ER (XL) 150 MG PO TB24: 150.0000 mg | ORAL_TABLET | ORAL | Status: DC
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
## 2014-12-24 ENCOUNTER — Ambulatory Visit (INDEPENDENT_AMBULATORY_CARE_PROVIDER_SITE_OTHER): Payer: 59 | Admitting: Psychiatry

## 2014-12-24 DIAGNOSIS — F321 Major depressive disorder, single episode, moderate: Secondary | ICD-10-CM

## 2014-12-24 NOTE — Patient Instructions (Signed)
Discussed orally 
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
## 2014-12-26 DIAGNOSIS — F329 Major depressive disorder, single episode, unspecified: Secondary | ICD-10-CM | POA: Diagnosis not present

## 2014-12-26 DIAGNOSIS — S61402A Unspecified open wound of left hand, initial encounter: Secondary | ICD-10-CM | POA: Diagnosis not present

## 2014-12-26 DIAGNOSIS — X58XXXA Exposure to other specified factors, initial encounter: Secondary | ICD-10-CM | POA: Diagnosis not present

## 2014-12-26 DIAGNOSIS — E780 Pure hypercholesterolemia: Secondary | ICD-10-CM | POA: Diagnosis not present

## 2014-12-26 DIAGNOSIS — S61412A Laceration without foreign body of left hand, initial encounter: Secondary | ICD-10-CM | POA: Diagnosis not present

## 2014-12-26 DIAGNOSIS — Z780 Asymptomatic menopausal state: Secondary | ICD-10-CM | POA: Diagnosis not present

## 2014-12-26 DIAGNOSIS — Z23 Encounter for immunization: Secondary | ICD-10-CM | POA: Diagnosis not present

## 2014-12-26 DIAGNOSIS — Z8673 Personal history of transient ischemic attack (TIA), and cerebral infarction without residual deficits: Secondary | ICD-10-CM | POA: Diagnosis not present

## 2014-12-26 DIAGNOSIS — K219 Gastro-esophageal reflux disease without esophagitis: Secondary | ICD-10-CM | POA: Diagnosis not present

## 2014-12-26 DIAGNOSIS — H269 Unspecified cataract: Secondary | ICD-10-CM | POA: Diagnosis not present

## 2014-12-26 DIAGNOSIS — Z9049 Acquired absence of other specified parts of digestive tract: Secondary | ICD-10-CM | POA: Diagnosis not present

## 2014-12-26 DIAGNOSIS — I2510 Atherosclerotic heart disease of native coronary artery without angina pectoris: Secondary | ICD-10-CM | POA: Diagnosis not present

## 2014-12-26 DIAGNOSIS — Z79899 Other long term (current) drug therapy: Secondary | ICD-10-CM | POA: Diagnosis not present

## 2015-01-07 ENCOUNTER — Ambulatory Visit (INDEPENDENT_AMBULATORY_CARE_PROVIDER_SITE_OTHER): Payer: Medicare Other

## 2015-01-07 ENCOUNTER — Encounter: Payer: Self-pay | Admitting: Family

## 2015-01-07 ENCOUNTER — Ambulatory Visit (INDEPENDENT_AMBULATORY_CARE_PROVIDER_SITE_OTHER): Payer: Medicare Other | Admitting: Family

## 2015-01-07 VITALS — BP 143/74 | HR 62 | Temp 97.0°F | Ht 64.0 in | Wt 131.4 lb

## 2015-01-07 DIAGNOSIS — I10 Essential (primary) hypertension: Secondary | ICD-10-CM

## 2015-01-07 DIAGNOSIS — G4700 Insomnia, unspecified: Secondary | ICD-10-CM | POA: Diagnosis not present

## 2015-01-07 DIAGNOSIS — F329 Major depressive disorder, single episode, unspecified: Secondary | ICD-10-CM

## 2015-01-07 DIAGNOSIS — F411 Generalized anxiety disorder: Secondary | ICD-10-CM | POA: Diagnosis not present

## 2015-01-07 DIAGNOSIS — E785 Hyperlipidemia, unspecified: Secondary | ICD-10-CM | POA: Diagnosis not present

## 2015-01-07 DIAGNOSIS — K219 Gastro-esophageal reflux disease without esophagitis: Secondary | ICD-10-CM | POA: Diagnosis not present

## 2015-01-07 DIAGNOSIS — F32A Depression, unspecified: Secondary | ICD-10-CM

## 2015-01-07 DIAGNOSIS — M8580 Other specified disorders of bone density and structure, unspecified site: Secondary | ICD-10-CM | POA: Insufficient documentation

## 2015-01-07 DIAGNOSIS — M810 Age-related osteoporosis without current pathological fracture: Secondary | ICD-10-CM

## 2015-01-07 DIAGNOSIS — Z780 Asymptomatic menopausal state: Secondary | ICD-10-CM | POA: Diagnosis not present

## 2015-01-07 MED ORDER — SIMVASTATIN 20 MG PO TABS: 20.0000 mg | ORAL_TABLET | Freq: Every day | ORAL | Status: DC

## 2015-01-07 MED ORDER — ALENDRONATE SODIUM 70 MG PO TABS: 70.0000 mg | ORAL_TABLET | ORAL | Status: DC

## 2015-01-07 MED ORDER — ALPRAZOLAM 0.5 MG PO TABS: 0.5000 mg | ORAL_TABLET | Freq: Two times a day (BID) | ORAL | Status: DC | PRN

## 2015-01-07 MED ORDER — MIRTAZAPINE 30 MG PO TABS: 30.0000 mg | ORAL_TABLET | Freq: Every day | ORAL | Status: DC

## 2015-01-07 MED ORDER — BUPROPION HCL ER (XL) 150 MG PO TB24: 150.0000 mg | ORAL_TABLET | ORAL | Status: DC

## 2015-01-07 MED ORDER — OMEPRAZOLE 20 MG PO CPDR: 20.0000 mg | DELAYED_RELEASE_CAPSULE | Freq: Every day | ORAL | Status: DC

## 2015-01-07 NOTE — Patient Instructions (Signed)

## 2015-01-07 NOTE — Progress Notes (Signed)
Subjective:    Patient ID: [name] Williams, female    DOB: 06-Mar-1928, 81 y.o.   MRN: [id number]  Anxiety Presents for follow-up visit. Symptoms include depressed mood, excessive worry, insomnia, irritability, nervous/anxious behavior and panic. Patient reports no palpitations or shortness of breath. Symptoms occur occasionally. The symptoms are aggravated by family issues.   Her past medical history is significant for anxiety/panic attacks and depression. Past treatments include SSRIs.  Hyperlipidemia This is a chronic problem. The current episode started more than 1 year ago. The problem is uncontrolled. Recent lipid tests were reviewed and are high. She has no history of diabetes or hypothyroidism. Pertinent negatives include no leg pain, myalgias or shortness of breath. Current antihyperlipidemic treatment includes statins. The current treatment provides moderate improvement of lipids. Risk factors for coronary artery disease include dyslipidemia and post-menopausal.  Gastrophageal Reflux She complains of choking, coughing, heartburn and a hoarse voice. She reports no belching or no sore throat. This is a chronic problem. The current episode started more than 1 year ago. The problem occurs frequently. The problem has been waxing and waning. The heartburn does not wake her from sleep. The symptoms are aggravated by certain foods and lying down. She has tried a PPI for the symptoms. The treatment provided significant relief.      Review of Systems  Constitutional: Positive for irritability.  HENT: Positive for hoarse voice. Negative for sore throat.   Eyes: Negative.   Respiratory: Positive for cough and choking. Negative for shortness of breath.   Cardiovascular: Negative.  Negative for palpitations.  Gastrointestinal: Positive for heartburn.  Endocrine: Negative.   Genitourinary: Negative.   Musculoskeletal: Negative.  Negative for myalgias.  Neurological: Negative.  Negative for  headaches.  Hematological: Negative.   Psychiatric/Behavioral: The patient is nervous/anxious and has insomnia.   All other systems reviewed and are negative.      Objective:   Physical Exam  Constitutional: She is oriented to person, place, and time. She appears well-developed and well-nourished. No distress.  HENT:  Head: Normocephalic and atraumatic.  Right Ear: External ear normal.  Left Ear: External ear normal.  Nose: Nose normal.  Mouth/Throat: Oropharynx is clear and moist.  Eyes: Pupils are equal, round, and reactive to light.  Neck: Normal range of motion. Neck supple. No thyromegaly present.  Cardiovascular: Normal rate, regular rhythm, normal heart sounds and intact distal pulses.   No murmur heard. Pulmonary/Chest: Effort normal and breath sounds normal. No respiratory distress. She has no wheezes.  Abdominal: Soft. Bowel sounds are normal. She exhibits no distension. There is no tenderness.  Musculoskeletal: Normal range of motion. She exhibits edema (2+ BLE). She exhibits no tenderness.  Neurological: She is alert and oriented to person, place, and time. She has normal reflexes. No cranial nerve deficit.  Skin: Skin is warm and dry.  Psychiatric: She has a normal mood and affect. Her behavior is normal. Judgment and thought content normal.  Vitals reviewed.     BP 143/74 mmHg  Pulse 62  Temp(Src) 97 F (36.1 C) (Oral)  Ht $R'5\' 4"'EF$  (1.626 m)  Wt 131 lb 6.4 oz (59.603 kg)  BMI 22.54 kg/m2     Assessment & Plan:  1. HYPERTENSION, BENIGN - CMP14+EGFR  2. Gastroesophageal reflux disease, esophagitis presence not specified - CMP14+EGFR - omeprazole (PRILOSEC) 20 MG capsule; Take 1 capsule (20 mg total) by mouth daily.  Dispense: 90 capsule; Refill: 3  3. Depression - CMP14+EGFR - buPROPion (WELLBUTRIN XL) 150 MG 24  hr tablet; Take 1 tablet (150 mg total) by mouth every morning.  Dispense: 90 tablet; Refill: 3  4. GAD (generalized anxiety disorder) -  CMP14+EGFR - buPROPion (WELLBUTRIN XL) 150 MG 24 hr tablet; Take 1 tablet (150 mg total) by mouth every morning.  Dispense: 90 tablet; Refill: 3 - ALPRAZolam (XANAX) 0.5 MG tablet; Take 1 tablet (0.5 mg total) by mouth 2 (two) times daily as needed for anxiety.  Dispense: 60 tablet; Refill: 3  5. Hyperlipidemia - CMP14+EGFR - Lipid panel - simvastatin (ZOCOR) 20 MG tablet; Take 1 tablet (20 mg total) by mouth at bedtime.  Dispense: 90 tablet; Refill: 3  6. Insomnia - CMP14+EGFR - mirtazapine (REMERON) 30 MG tablet; Take 1 tablet (30 mg total) by mouth at bedtime.  Dispense: 90 tablet; Refill: 3   Continue all meds Labs pending Health Maintenance reviewed-Pt refuses Pneumonia vaccine at this time Diet and exercise encouraged RTO 3 months  Evelina Dun, FNP

## 2015-01-21 ENCOUNTER — Ambulatory Visit (HOSPITAL_COMMUNITY): Payer: Self-pay | Admitting: Psychiatry

## 2015-01-25 ENCOUNTER — Telehealth: Payer: Self-pay | Admitting: Family

## 2015-01-25 NOTE — Telephone Encounter (Signed)
Appt given per patient request 

## 2015-01-26 ENCOUNTER — Ambulatory Visit (INDEPENDENT_AMBULATORY_CARE_PROVIDER_SITE_OTHER): Payer: Medicare Other | Admitting: Family

## 2015-01-26 ENCOUNTER — Encounter: Payer: Self-pay | Admitting: Family

## 2015-01-26 VITALS — BP 131/77 | HR 68 | Temp 97.0°F | Ht 64.0 in | Wt 129.6 lb

## 2015-01-26 DIAGNOSIS — F329 Major depressive disorder, single episode, unspecified: Secondary | ICD-10-CM

## 2015-01-26 DIAGNOSIS — F411 Generalized anxiety disorder: Secondary | ICD-10-CM

## 2015-01-26 DIAGNOSIS — F32A Depression, unspecified: Secondary | ICD-10-CM

## 2015-01-26 MED ORDER — DULOXETINE HCL 30 MG PO CPEP: 30.0000 mg | ORAL_CAPSULE | Freq: Every day | ORAL | Status: DC

## 2015-01-26 NOTE — Progress Notes (Signed)
   Subjective:    Patient ID: [name] Williams, female    DOB: July 01, 1928, 81 y.o.   MRN: [id number]  Anxiety Presents for follow-up visit. Onset was 1 to 6 months ago. The problem has been waxing and waning. Symptoms include depressed mood, dry mouth, excessive worry, irritability, nervous/anxious behavior, palpitations, panic and restlessness. Patient reports no compulsions, dizziness, insomnia or shortness of breath. Symptoms occur constantly. The severity of symptoms is moderate. The symptoms are aggravated by family issues, work stress and specific phobias. The quality of sleep is poor. Nighttime awakenings: occasional.   Her past medical history is significant for anxiety/panic attacks and depression. Past treatments include benzodiazephines and SSRIs. The treatment provided mild relief. Compliance with prior treatments has been good.      Review of Systems  Constitutional: Positive for irritability.  Eyes: Negative.   Respiratory: Negative.  Negative for shortness of breath.   Cardiovascular: Positive for palpitations.  Gastrointestinal: Negative.   Endocrine: Negative.   Genitourinary: Negative.   Musculoskeletal: Negative.   Neurological: Negative.  Negative for dizziness and headaches.  Hematological: Negative.   Psychiatric/Behavioral: The patient is nervous/anxious. The patient does not have insomnia.   All other systems reviewed and are negative.      Objective:   Physical Exam  Constitutional: She is oriented to person, place, and time. She appears well-developed and well-nourished. No distress.  HENT:  Head: Normocephalic and atraumatic.  Right Ear: External ear normal.  Left Ear: External ear normal.  Mouth/Throat: Oropharynx is clear and moist.  Eyes: Pupils are equal, round, and reactive to light.  Neck: Normal range of motion. Neck supple. No thyromegaly present.  Cardiovascular: Normal rate, regular rhythm, normal heart sounds and intact distal pulses.   No  murmur heard. Pulmonary/Chest: Effort normal and breath sounds normal. No respiratory distress. She has no wheezes.  Abdominal: Soft. Bowel sounds are normal. She exhibits no distension. There is no tenderness.  Musculoskeletal: Normal range of motion. She exhibits no edema or tenderness.  Neurological: She is alert and oriented to person, place, and time. She has normal reflexes. No cranial nerve deficit.  Skin: Skin is warm and dry.  Psychiatric: She has a normal mood and affect. Her behavior is normal. Judgment and thought content normal.  Vitals reviewed.   BP 131/77 mmHg  Pulse 68  Temp(Src) 97 F (36.1 C) (Oral)  Ht 5\' 4"  (1.626 m)  Wt 129 lb 9.6 oz (58.786 kg)  BMI 22.23 kg/m2       Assessment & Plan:  1. Depression -Stress management discussed -Pt needs to take medication daily -RTO 2 weeks - DULoxetine (CYMBALTA) 30 MG capsule; Take 1 capsule (30 mg total) by mouth daily.  Dispense: 90 capsule; Refill: 3  2. GAD (generalized anxiety disorder) -Stress management discussed -Pt needs to take medication daily -RTO 2 weeks - DULoxetine (CYMBALTA) 30 MG capsule; Take 1 capsule (30 mg total) by mouth daily.  Dispense: 90 capsule; Refill: [place], FNP

## 2015-01-26 NOTE — Patient Instructions (Signed)
Generalized Anxiety Disorder Generalized anxiety disorder (GAD) is a mental disorder. It interferes with life functions, including relationships, work, and school. GAD is different from normal anxiety, which everyone experiences at some point in their lives in response to specific life events and activities. Normal anxiety actually helps [place] prepare for and get through these life events and activities. Normal anxiety goes away after the event or activity is over.  GAD causes anxiety that is not necessarily related to specific events or activities. It also causes excess anxiety in proportion to specific events or activities. The anxiety associated with GAD is also difficult to control. GAD can vary from mild to severe. People with severe GAD can have intense waves of anxiety with physical symptoms (panic attacks).  SYMPTOMS The anxiety and worry associated with GAD are difficult to control. This anxiety and worry are related to many life events and activities and also occur more days than not for 6 months or longer. People with GAD also have three or more of the following symptoms (one or more in children):  Restlessness.   Fatigue.  Difficulty concentrating.   Irritability.  Muscle tension.  Difficulty sleeping or unsatisfying sleep. DIAGNOSIS GAD is diagnosed through an assessment by your health care provider. Your health care provider will ask you questions aboutyour mood,physical symptoms, and events in your life. Your health care provider may ask you about your medical history and use of alcohol or drugs, including prescription medicines. Your health care provider may also do a physical exam and blood tests. Certain medical conditions and the use of certain substances can cause symptoms similar to those associated with GAD. Your health care provider may refer you to a mental health specialist for further evaluation. TREATMENT The following therapies are usually used to treat GAD:    Medication. Antidepressant medication usually is prescribed for long-term daily control. Antianxiety medicines may be added in severe cases, especially when panic attacks occur.   Talk therapy (psychotherapy). Certain types of talk therapy can be helpful in treating GAD by providing support, education, and guidance. A form of talk therapy called cognitive behavioral therapy can teach you healthy ways to think about and react to daily life events and activities.  Stress managementtechniques. These include yoga, meditation, and exercise and can be very helpful when they are practiced regularly. A mental health specialist can help determine which treatment is best for you. Some people see improvement with one therapy. However, other people require a combination of therapies. Document Released: 12/01/2012 Document Revised: 12/21/2013 Document Reviewed: 12/01/2012 [facility] Patient Information 2015 [place], [place]. This information is not intended to replace advice given to you by your health care provider. Make sure you discuss any questions you have with your health care provider. Depression Depression refers to feeling sad, low, down in the dumps, blue, gloomy, or empty. In general, there are two kinds of depression:  Normal sadness or normal grief. This kind of depression is one that we all feel from time to time after upsetting life experiences, such as the loss of a job or the ending of a relationship. This kind of depression is considered normal, is short lived, and resolves within a few days to 2 weeks. Depression experienced after the loss of a loved one (bereavement) often lasts longer than 2 weeks but normally gets better with time.  Clinical depression. This kind of depression lasts longer than normal sadness or normal grief or interferes with your ability to function at home, at work, and in school.  It also interferes with your personal relationships. It affects almost every aspect of your  life. Clinical depression is an illness. Symptoms of depression can also be caused by conditions other than those mentioned above, such as:  Physical illness. Some physical illnesses, including underactive thyroid gland (hypothyroidism), severe anemia, specific types of cancer, diabetes, uncontrolled seizures, heart and lung problems, strokes, and chronic pain are commonly associated with symptoms of depression.  Side effects of some prescription medicine. In some people, certain types of medicine can cause symptoms of depression.  Substance abuse. Abuse of alcohol and illicit drugs can cause symptoms of depression. SYMPTOMS Symptoms of normal sadness and normal grief include the following:  Feeling sad or crying for short periods of time.  Not caring about anything (apathy).  Difficulty sleeping or sleeping too much.  No longer able to enjoy the things you used to enjoy.  Desire to be by oneself all the time (social isolation).  Lack of energy or motivation.  Difficulty concentrating or remembering.  Change in appetite or weight.  Restlessness or agitation. Symptoms of clinical depression include the same symptoms of normal sadness or normal grief and also the following symptoms:  Feeling sad or crying all the time.  Feelings of guilt or worthlessness.  Feelings of hopelessness or helplessness.  Thoughts of suicide or the desire to harm yourself (suicidal ideation).  Loss of touch with reality (psychotic symptoms). Seeing or hearing things that are not real (hallucinations) or having false beliefs about your life or the people around you (delusions and paranoia). DIAGNOSIS  The diagnosis of clinical depression is usually based on how bad the symptoms are and how long they have lasted. Your health care provider will also ask you questions about your medical history and substance use to find out if physical illness, use of prescription medicine, or substance abuse is causing  your depression. Your health care provider may also order blood tests. TREATMENT  Often, normal sadness and normal grief do not require treatment. However, sometimes antidepressant medicine is given for bereavement to ease the depressive symptoms until they resolve. The treatment for clinical depression depends on how bad the symptoms are but often includes antidepressant medicine, counseling with a mental health professional, or both. Your health care provider will help to determine what treatment is best for you. Depression caused by physical illness usually goes away with appropriate medical treatment of the illness. If prescription medicine is causing depression, talk with your health care provider about stopping the medicine, decreasing the dose, or changing to another medicine. Depression caused by the abuse of alcohol or illicit drugs goes away when you stop using these substances. Some adults need professional help in order to stop drinking or using drugs. SEEK IMMEDIATE MEDICAL CARE IF:  You have thoughts about hurting yourself or others.  You lose touch with reality (have psychotic symptoms).  You are taking medicine for depression and have a serious side effect. FOR MORE INFORMATION  National Alliance on Mental Illness: www.nami.[employer]: https://carter.com/ Document Released: 08/03/2000 Document Revised: 12/21/2013 Document Reviewed: 11/05/2011 [facility] Patient Information 2015 [place], [place]. This information is not intended to replace advice given to you by your health care provider. Make sure you discuss any questions you have with your health care provider.

## 2015-01-28 ENCOUNTER — Ambulatory Visit (INDEPENDENT_AMBULATORY_CARE_PROVIDER_SITE_OTHER): Payer: 59 | Admitting: Psychiatry

## 2015-01-28 DIAGNOSIS — F321 Major depressive disorder, single episode, moderate: Secondary | ICD-10-CM
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
## 2015-01-28 NOTE — Patient Instructions (Signed)
Discussed orally 

## 2015-02-07 ENCOUNTER — Encounter (HOSPITAL_COMMUNITY): Payer: Self-pay | Admitting: Psychiatry

## 2015-02-07 ENCOUNTER — Ambulatory Visit (INDEPENDENT_AMBULATORY_CARE_PROVIDER_SITE_OTHER): Payer: 59 | Admitting: Psychiatry

## 2015-02-07 VITALS — BP 121/69 | HR 68 | Ht 64.0 in | Wt 135.4 lb

## 2015-02-07 DIAGNOSIS — F329 Major depressive disorder, single episode, unspecified: Secondary | ICD-10-CM | POA: Diagnosis not present

## 2015-02-07 DIAGNOSIS — F411 Generalized anxiety disorder: Secondary | ICD-10-CM | POA: Diagnosis not present

## 2015-02-07 DIAGNOSIS — F321 Major depressive disorder, single episode, moderate: Secondary | ICD-10-CM

## 2015-02-07 NOTE — Progress Notes (Signed)
Patient ID: ANIKAH HOGGE, female   DOB: [birth year], 81 y.o.   MRN: [id number] Patient ID: SHAMEIKA SPEELMAN, female   DOB: Jun 20, 1928, 81 y.o.   MRN: [id number]  Psychiatric Assessment Adult  Patient Identification:  COMFORT IVERSEN Date of Evaluation:  02/07/2015 Chief Complaint: "I'm doing ok" History of Chief Complaint:   Chief Complaint  Patient presents with  . Depression  . Anxiety  . Follow-up    Anxiety Symptoms include nausea and nervous/anxious behavior.     this patient is an 81 year old widowed white female who lives with her son in [place]. She has one other son and a daughter as well as 5 grandchildren. 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 She is self-referred but goes to [facility] for her medical care.  The patient states that she's been in good health most of her life and has worked very hard both the tobacco fields and [employer]. 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 She states that her daughter-in-law started working there in a paid position. The patient's husband died in 12-02-2009 after lengthy illness. The patient decided after this to take [employer] the funds she had put in her son and daughter-in-law's name back into her own name. This is when the problem started. She thinks her daughter-in-law became angry about this and started to retaliate against her. She claims the daughter-in-law made up stories about her being out of control and agitated and rude to patrons of the nutrition site. She finally got a letter in November 2014 from the Department of aging telling her that she was no longer allowed to volunteer there.  She states that this "devastated me" all of her friends attended the site and she had been there 20 years without incident. She was unable to sleep or eat. She lost more than 30 pounds. This went on for about a year and she eventually went to [facility] last fall and was  admitted for a couple of days. The doctor there put her on mirtazapine 15 mg daily at bedtime which she claims "saved my life" because she started to eat and sleep again. She is also on Wellbutrin which he thinks has helped. For a while she took Ativan and Xanax but no longer takes these.  The patient states that she decided to come to behavioral health because the problem "still eats away at me." She still doesn't talk to her daughter-in-law and his strained relationship with her's son who is married to her. She still has some trouble sleeping and only sleeps 5-6 hours a night. Is still difficult for her to eat much and she drinks an sure when she can. She has not regained much weight. She is somewhat depressed but keeps on going to her exercise classes and is very active but she claims she has to "make myself" she denies having panic attacks but is anxious. She denies suicidal ideation or any psychotic symptoms and is very bright and alert and her memory is good. Her energy is lower than it used to be. She does have a boyfriend and they spend time together each day. She is also sad because many of her friends have died or gone into nursing homes  The patient returns after 2 months. Since I last saw her she also went back to her nurse practitioner. They had added Cymbalta but she is already on Remeron  and Wellbutrin and this is too much antidepressive medicine for someone of her age group. I have sent a message to the nurse practitioner and I've also instructed her to stop the Wellbutrin. She thinks the Cymbalta has helped her more. She has been getting out more and is starting to gain more weight. She still spending time with her boyfriend Review of Systems  Constitutional: Positive for activity change, appetite change and unexpected weight change.  HENT: Negative.   Respiratory: Negative.   Cardiovascular: Negative.   Gastrointestinal: Positive for nausea and diarrhea.  Endocrine: Negative.    Genitourinary: Negative.   Musculoskeletal: Negative.   Skin: Negative.   Allergic/Immunologic: Negative.   Neurological: Negative.   Hematological: Negative.   Psychiatric/Behavioral: Positive for sleep disturbance and dysphoric mood. The patient is nervous/anxious.    Physical Exam not done  Depressive Symptoms: depressed mood, anhedonia, insomnia, psychomotor retardation, anxiety, loss of energy/fatigue,  (Hypo) Manic Symptoms:   Elevated Mood:  No Irritable Mood:  No Grandiosity:  No Distractibility:  No Labiality of Mood:  No Delusions:  No Hallucinations:  No Impulsivity:  No Sexually Inappropriate Behavior:  No Financial Extravagance:  No Flight of Ideas:  No  Anxiety Symptoms: Excessive Worry:  Yes Panic Symptoms:  No Agoraphobia:  No Obsessive Compulsive: No  Symptoms: None, Specific Phobias:  No Social Anxiety:  No  Psychotic Symptoms:  Hallucinations: No None Delusions:  No Paranoia:  No   Ideas of Reference:  No  PTSD Symptoms: Ever had a traumatic exposure:  No Had a traumatic exposure in the last month:  No Re-experiencing: No None Hypervigilance:  No Hyperarousal: No None Avoidance: No None  Traumatic Brain Injury: No  Past Psychiatric History: Diagnosis: Depression and anxiety   Hospitalizations: none  Outpatient Care: none except medication management by primary care   Substance Abuse Care: none  Self-Mutilation: none  Suicidal Attempts:none  Violent Behaviors: none   Past Medical History:   Past Medical History  Diagnosis Date  . Hypertrophic obstructive cardiomyopathy   . Unspecified transient cerebral ischemia   . Esophageal reflux   . Other and unspecified hyperlipidemia   . Essential hypertension, benign   . Depressive disorder, not elsewhere classified   . Anxiety    History of Loss of Consciousness:  No Seizure History:  No Cardiac History:  No Allergies:   Allergies  Allergen Reactions  . Latex     Per pt  11-10-14 she do not remember being allergic to this.../or   Current Medications:  Current Outpatient Prescriptions  Medication Sig Dispense Refill  . alendronate (FOSAMAX) 70 MG tablet Take 1 tablet (70 mg total) by mouth every 7 (seven) days. Take with a full glass of water on an empty stomach. 4 tablet 11  . ALPRAZolam (XANAX) 0.5 MG tablet Take 1 tablet (0.5 mg total) by mouth 2 (two) times daily as needed for anxiety. 60 tablet 3  . aspirin 81 MG EC tablet Take 81 mg by mouth.    . DULoxetine (CYMBALTA) 30 MG capsule Take 1 capsule (30 mg total) by mouth daily. 90 capsule 3  . ENSURE (ENSURE) Take 237 mLs by mouth.    . mirtazapine (REMERON) 30 MG tablet Take 1 tablet (30 mg total) by mouth at bedtime. 90 tablet 3  . Multiple Vitamin tablet Take by mouth daily.    Marland Kitchen omeprazole (PRILOSEC) 20 MG capsule Take 1 capsule (20 mg total) by mouth daily. 90 capsule 3  . simvastatin (ZOCOR) 20 MG tablet  Take 1 tablet (20 mg total) by mouth at bedtime. 90 tablet 3  . vitamin B-12 (CYANOCOBALAMIN) 500 MCG tablet Take 500 mcg by mouth as needed.      No current facility-administered medications for this visit.    Previous Psychotropic Medications:  Medication Dose   Wellbutrin XL, Remeron, Xanax                        Substance Abuse History in the last 12 months: Substance Age of 1st Use Last Use Amount Specific Type  Nicotine      Alcohol      Cannabis      Opiates      Cocaine      Methamphetamines      LSD      Ecstasy      Benzodiazepines      Caffeine      Inhalants      Others:                          Medical Consequences of Substance Abuse: none  Legal Consequences of Substance Abuse: none  Family Consequences of Substance Abuse: none  Blackouts:  No DT's:  No Withdrawal Symptoms:  No None  Social History: Current Place of Residence: [place] of Birth: [place] Family Members: 6 living siblings, 3 children, 5  grandchildren Marital Status:  Widowed Children:   Sons: 2  Daughters: 1 Relationships: Has a steady boyfriend Education:  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 Legal History: none Hobbies/Interests: Exercise class church friends  Family History:   Family History  Problem Relation Age of Onset  . Breast cancer Mother   . Stomach cancer Sister   . Uterine cancer Sister   . Depression Sister     Mental Status Examination/Evaluation: Objective:  Appearance: Casual, Neat and Well Groomed  Eye Contact::  Good  Speech:  Clear and Coherent  Volume: Normal   Mood: Good   Affect:  Bright  Thought Process:  Circumstantial and Goal Directed  Orientation:  Full (Time, Place, and Person)  Thought Content:  Rumination  Suicidal Thoughts:  No  Homicidal Thoughts:  No  Judgement:  Good  Insight:  Fair  Psychomotor Activity:  Normal  Akathisia:  No  Handed:  Right  AIMS (if indicated):    Assets:  Communication Skills Desire for Improvement Physical Health Resilience Social Support    Laboratory/X-Ray Psychological Evaluation(s)   Recent labs are all within normal limits      Assessment:  Axis I: Generalized Anxiety Disorder and Major Depression, single episode  AXIS I Generalized Anxiety Disorder and Major Depression, single episode  AXIS II Deferred  AXIS III Past Medical History  Diagnosis Date  . Hypertrophic obstructive cardiomyopathy   . Unspecified transient cerebral ischemia   . Esophageal reflux   . Other and unspecified hyperlipidemia   . Essential hypertension, benign   . Depressive disorder, not elsewhere classified   . Anxiety      AXIS IV other psychosocial or environmental problems  AXIS V 51-60 moderate symptoms   Treatment  Plan/Recommendations:  Plan of Care: Medication management   Laboratory:   Psychotherapy: She will be assigned a counselor here   Medications: . She'll continue mirtazapine 30 mg .  at bedtime to help with anxiety sleep and appetite. She'll continue Xanax 0.5 mg twice a day as needed for anxiety. She will continue Cymbalta 30 mg daily for depression and discontinue the Wellbutrin to avoid polypharmacy   Routine PRN Medications:  No  Consultations:   Safety Concerns:  She denies thoughts of harm to self or others   Other: She'll return in 4 weeks     Levonne Spiller, MD 6/20/20161:49 PM

## 2015-02-08 ENCOUNTER — Encounter: Payer: Self-pay | Admitting: Nurse Practitioner

## 2015-02-08 ENCOUNTER — Ambulatory Visit (INDEPENDENT_AMBULATORY_CARE_PROVIDER_SITE_OTHER): Payer: Medicare Other | Admitting: Nurse Practitioner

## 2015-02-08 VITALS — BP 137/76 | HR 69 | Ht 64.0 in | Wt 135.4 lb

## 2015-02-08 DIAGNOSIS — R413 Other amnesia: Secondary | ICD-10-CM

## 2015-02-08 DIAGNOSIS — G459 Transient cerebral ischemic attack, unspecified: Secondary | ICD-10-CM

## 2015-02-08 NOTE — Progress Notes (Signed)
[facility]  PATIENT: [name] Williams DOB: [birth year]   REASON FOR VISIT: Follow-up for history of TIA  HISTORY FROM: Patient    HISTORY OF PRESENT ILLNESS:Ms. Pettibone, 81 year old white female returns today for followup. She was last seen 01/28/2014. She has a history of brief episodes of unawareness, slurring of speech and left facial droop, possible posterior circulatory TIAs. Her episodes last for a couple of hours with improvement, she has had no episodes in over 5 yrs. She denies any headaches. EEGs have been normal. There is no prior history of stroke or seizure or significant head injury with loss of consciousness. CT also shows mild age related atrophy. MRI of the brain showed no definite stroke. 2-D echo has been unremarkable. Carotid Dopplers have shown only mild stenosis. She returns today doing well, she remains on aspirin . She denies any side effects. Husband died several years ago from cancer. She remains independent in all activities of daily living, continues to drive. She exercises [facility] [employer] [facility]. She lives with her son. No new neurological complaints. She continues to have mild short-term memory difficulties which appear to be unchanged. She tells me that she is seeing a counselor for her anxiety and depression.    REVIEW OF SYSTEMS: Full 14 system review of systems performed and notable only for those listed, all others are neg:  Constitutional: neg  Cardiovascular: Lower leg swelling Ear/Nose/Throat: neg  Skin: neg Eyes: Blurred vision Respiratory: neg Gastroitestinal: neg  Hematology/Lymphatic: neg  Endocrine: neg Musculoskeletal:neg Allergy/Immunology: neg Neurological: neg Psychiatric: Anxiety  Sleep : neg   ALLERGIES: Allergies  Allergen Reactions  . Latex     Per pt 11-10-14 she do not remember being allergic to this.../or    HOME MEDICATIONS: Outpatient Prescriptions Prior to Visit  Medication Sig  Dispense Refill  . alendronate (FOSAMAX) 70 MG tablet Take 1 tablet (70 mg total) by mouth every 7 (seven) days. Take with a full glass of water on an empty stomach. 4 tablet 11  . ALPRAZolam (XANAX) 0.5 MG tablet Take 1 tablet (0.5 mg total) by mouth 2 (two) times daily as needed for anxiety. 60 tablet 3  . aspirin 81 MG EC tablet Take 81 mg by mouth.    . DULoxetine (CYMBALTA) 30 MG capsule Take 1 capsule (30 mg total) by mouth daily. 90 capsule 3  . ENSURE (ENSURE) Take 237 mLs by mouth.    . mirtazapine (REMERON) 30 MG tablet Take 1 tablet (30 mg total) by mouth at bedtime. 90 tablet 3  . Multiple Vitamin tablet Take by mouth daily.    Marland Kitchen omeprazole (PRILOSEC) 20 MG capsule Take 1 capsule (20 mg total) by mouth daily. 90 capsule 3  . simvastatin (ZOCOR) 20 MG tablet Take 1 tablet (20 mg total) by mouth at bedtime. 90 tablet 3  . vitamin B-12 (CYANOCOBALAMIN) 500 MCG tablet Take 500 mcg by mouth as needed.      No facility-administered medications prior to visit.    PAST MEDICAL HISTORY: Past Medical History  Diagnosis Date  . Hypertrophic obstructive cardiomyopathy   . Unspecified transient cerebral ischemia   . Esophageal reflux   . Other and unspecified hyperlipidemia   . Essential hypertension, benign   . Depressive disorder, not elsewhere classified   . Anxiety     PAST SURGICAL HISTORY: Past Surgical History  Procedure Laterality Date  . Cervix surgery    . Bladder repair    . Cholecystectomy    .  Tonsillectomy    . Tubal ligation      FAMILY HISTORY: Family History  Problem Relation Age of Onset  . Breast cancer Mother   . Stomach cancer Sister   . Uterine cancer Sister   . Depression Sister     SOCIAL HISTORY: History   Social History  . Marital Status: Married    Spouse Name: N/A  . Number of Children: 3  . Years of Education: N/A   Occupational History  . Not on file.   Social History Main Topics  . Smoking status: Former Research scientist (life sciences)  . Smokeless  tobacco: Never Used     Comment: quit 2012  . Alcohol Use: No  . Drug Use: No  . Sexual Activity: Not Currently   Other Topics Concern  . Not on file   Social History Narrative   Patient is retired.    Patient is widowed.    Patient has 3 children.    Patient is right handed.    Pt lives with son.       PHYSICAL EXAM  Filed Vitals:   02/08/15 1311  BP: 137/76  Pulse: 69  Height: 5\' 4"  (1.626 m)  Weight: 135 lb 6.4 oz (61.417 kg)   Body mass index is 23.23 kg/(m^2). Generalized: Well developed, in no acute distress  Head: normocephalic and atraumatic,. Oropharynx benign  Neck: Supple, no carotid bruits  Cardiac: Regular rate rhythm, no murmur  Musculoskeletal: No deformity   Neurological examination   Mentation: Alert oriented to time, place, history taking. MMSE 25/30. AFT 11. Last MMSE 22/30. Follows all commands speech and language fluent  Cranial nerve II-XII: .Pupils were equal round reactive to light extraocular movements were full, visual field were full on confrontational test. Facial sensation and strength were normal. hearing was intact to finger rubbing bilaterally. Uvula tongue midline. head turning and shoulder shrug were normal and symmetric.Tongue protrusion into cheek strength was normal. Motor: normal bulk and tone, full strength in the BUE, BLE, fine finger movements normal, no pronator drift. No focal weakness Sensory: normal and symmetric to light touch, pinprick, and vibration  Coordination: finger-nose-finger, heel-to-shin bilaterally, no dysmetria Reflexes: 1+ upper lower and symmetric, plantar responses were flexor bilaterally. Gait and Station: Rising up from seated position without assistance, normal stance, moderate stride, good arm swing, smooth turning, able to perform tiptoe, and heel walking without difficulty. Tandem gait is steady, no assistive device DIAGNOSTIC DATA (LABS, IMAGING, TESTING) - I reviewed patient records, labs, notes,  testing and imaging myself where available.      Component Value Date/Time   NA 140 10/06/2014 1457   NA 140 03/31/2010 0845   K 5.0 10/06/2014 1457   CL 99 10/06/2014 1457   CO2 27 10/06/2014 1457   GLUCOSE 103* 10/06/2014 1457   GLUCOSE 107* 03/31/2010 0845   BUN 22 10/06/2014 1457   BUN 16 03/31/2010 0845   CREATININE 1.38* 10/06/2014 1457   CALCIUM 9.7 10/06/2014 1457   PROT 6.8 10/06/2014 1457   PROT 5.4* 01/21/2010 0401   ALBUMIN 3.2* 01/21/2010 0401   AST 20 10/06/2014 1457   ALT 13 10/06/2014 1457   ALKPHOS 65 10/06/2014 1457   BILITOT 0.5 10/06/2014 1457   BILITOT 0.9 01/21/2010 0401   GFRNONAA 35* 10/06/2014 1457   GFRAA 40* 10/06/2014 1457   Lab Results  Component Value Date   CHOL 133 10/06/2014   HDL 51 10/06/2014   LDLCALC 60 10/06/2014   TRIG 110 10/06/2014   CHOLHDL 2.6  10/06/2014        ASSESSMENT AND PLAN  81 y.o. year old female  has a past medical history of episodes of altered awareness with slurred speech and left facial droop, these were felt to represent posterior circulatory TIAs. No episodes in 5 years. Carotid Doppler last year was negative for significant stenosis. The patient is a current patient of Dr. Leonie Man  who is out of the office today . This note is sent to the work in doctor.     Memory score is stable Continue aspirin for secondary stroke prevention Given copy of carotid Doppler results from last year, no stenosis Continue seeing a counselor for your anxiety Healthy diet and exercise for overall health Reviewed lipid profile and recent CMP Follow-up yearly and when necessary Dennie Bible, [facility], [facility], APRN  [facility] [address] [place], [place] [address] [phone]

## 2015-02-08 NOTE — Patient Instructions (Addendum)
Memory score is stable Continue aspirin for secondary stroke prevention Given copy of carotid Doppler results from last year, no stenosis Continue seeing a counselor for your anxiety Healthy diet and exercise for overall health Follow-up yearly and when necessary

## 2015-02-14 ENCOUNTER — Ambulatory Visit: Payer: Medicare Other | Admitting: Family

## 2015-02-15 NOTE — Progress Notes (Signed)
I have reviewed and agreed above plan. 

## 2015-02-24 ENCOUNTER — Other Ambulatory Visit: Payer: Self-pay

## 2015-02-24 DIAGNOSIS — K219 Gastro-esophageal reflux disease without esophagitis: Secondary | ICD-10-CM

## 2015-02-24 DIAGNOSIS — E785 Hyperlipidemia, unspecified: Secondary | ICD-10-CM

## 2015-02-24 DIAGNOSIS — F411 Generalized anxiety disorder: Secondary | ICD-10-CM

## 2015-02-24 NOTE — Telephone Encounter (Signed)
Last seen 01/26/15 [name] Williams   If approved print for mail order

## 2015-02-26 MED ORDER — OMEPRAZOLE 20 MG PO CPDR: 20.0000 mg | DELAYED_RELEASE_CAPSULE | Freq: Every day | ORAL | Status: DC

## 2015-02-26 MED ORDER — SIMVASTATIN 20 MG PO TABS: 20.0000 mg | ORAL_TABLET | Freq: Every day | ORAL | Status: DC

## 2015-02-26 MED ORDER — ALPRAZOLAM 0.5 MG PO TABS: 0.5000 mg | ORAL_TABLET | Freq: Two times a day (BID) | ORAL | Status: DC | PRN

## 2015-02-28 ENCOUNTER — Ambulatory Visit (INDEPENDENT_AMBULATORY_CARE_PROVIDER_SITE_OTHER): Payer: 59 | Admitting: Psychiatry

## 2015-02-28 DIAGNOSIS — F321 Major depressive disorder, single episode, moderate: Secondary | ICD-10-CM

## 2015-02-28 NOTE — Patient Instructions (Signed)
Discussed orally 

## 2015-02-28 NOTE — Progress Notes (Signed)
THERAPIST PROGRESS NOTE  Session Time: Monday 02/28/2015 2:10 PM - 3:00 PM  Participation Level: Active  Behavioral Response: Well GroomedAlert/anxious  Type of Therapy: Individual Therapy  Treatment Goals addressed: improve ability to manage stress and anxiety  Interventions: Supportive  Summary: [name] Williams is a 81 y.o. female who is referred for services by psychiatrist Dr. Harrington Challenger to improve coping skills. Per patient's report, she began experiencing overwhelming stress in 2014. She was as Psychologist, occupational at a nutrition site for 22 years. Her daughter-in-law started working at the site and caused patient to lose her job by lying on patient per patient's report. She says daughter-in-law was upset because patient wouldn't allow her to take over patient's finances about 5 years ago after patient had a stroke. Patient states this issue was between her and her son but her daughter-in-law  allowed this to interfere with the work relationship. She also says daughter-in-law wanted patient's boyfriend to date her friend instead of dating patient.  Patient has not seen daughter in law since these issues in 2014 and says daughter-in-law has turned patient's granddaughters against her..She has been distraught about losing her volunteer positions as this had been such a big part of her life and was her source of social involvement and contact. She reports becoming so depressed that she experienced loss of appetite, 25 pound weight loss, sleep difficulty, low energy, and loss of interest in activities. She became fearful of being so depressed and was seen at the ER in fall 2015. She was advised to pursue counseling at that time but patient reports having difficulty finding practice to accept her insurance.  Patient continues to ruminate about the past and experience decreased interest in activities. She states having to push herself to get out of the bed.  Patient reports significant improvement in mood since  last session.  She states she hasn't needed to take any medication for anxiety in the past 2-3 weeks. She says she has decided to stop worrying about situation with daughter-in-law. She recently heard that daughter-in-law made negative comments about her on Face Book. However, patient was not stressed by this and has been able to use healthy coping statements regarding the situation. She reports increased motivation and  has been more involved in activities. She has been going to musical events and continued to go to [facility] [employer], attend church, and participate in a Senior program. She also recently reached out to a member at her church to initiate a friendship. Patient also has been reading books and  bible, and doing puzzles nightly. She states she has been happy. She also reports improved appetite and states she has probably gained weight. She expresses concern about the man she has been dating due to his health. She anticipates he will be going to an assisted living facility. She reports being okay with this as he will receive the care he needs and they can maintain their relationship.          Suicidal/Homicidal: No  Therapist Response: Therapist works with patient to process feelings, praise efforts to expand social network, discuss progress,  Plan: Patient is pleased with her progress and no longer reports depression. She is involved in activities and is using healthy coping skills. Patient and therapist agree to terminate psychotherapy services at this time. Patient agrees to continue seeing  Psychiatrist Dr. Harrington Challenger for medication management. Patient is encouraged to contact this practice should she need services in the future.   Diagnosis: Axis I: Major Depression,  single episode    Axis II: No diagnosis    Boluwatife Flight, LCSW 02/28/2015     Outpatient Therapist Discharge Summary  [name] Williams    Jul 04, 1928   Admission Date: 12/06/2014 Discharge Date:  02/28/2015 Reason for Discharge:  Treatment  completed Diagnosis:  Axis I:  Major depressive disorder, single episode, moderate   Axis V:  81-90  Comments:  Patient agrees to continue seeing  Psychiatrist Dr. Harrington Challenger for medication management. Patient is encouraged to contact this practice should she need services in the future.    Donavon Kimrey E Marcell Chavarin LCSW

## 2015-02-28 NOTE — Telephone Encounter (Signed)
rx called into pharmacy

## 2015-03-01 NOTE — Telephone Encounter (Signed)
Called [facility] & cancelled Refill of pt's Xanax she has a written Rx which she is aware of is at our front desk ready for pickup

## 2015-03-07 ENCOUNTER — Ambulatory Visit (INDEPENDENT_AMBULATORY_CARE_PROVIDER_SITE_OTHER): Payer: 59 | Admitting: Psychiatry

## 2015-03-07 ENCOUNTER — Encounter (HOSPITAL_COMMUNITY): Payer: Self-pay | Admitting: Psychiatry

## 2015-03-07 VITALS — BP 130/70 | Ht 64.0 in | Wt 134.0 lb

## 2015-03-07 DIAGNOSIS — F32A Depression, unspecified: Secondary | ICD-10-CM

## 2015-03-07 DIAGNOSIS — F411 Generalized anxiety disorder: Secondary | ICD-10-CM

## 2015-03-07 DIAGNOSIS — F329 Major depressive disorder, single episode, unspecified: Secondary | ICD-10-CM

## 2015-03-07 DIAGNOSIS — F321 Major depressive disorder, single episode, moderate: Secondary | ICD-10-CM

## 2015-03-07 NOTE — Progress Notes (Signed)
Patient ID: [name] Williams, female   DOB: [birth year], 81 y.o.   MRN: [id number] Patient ID: [name] Williams, female   DOB: Jul 12, 1928, 81 y.o.   MRN: [id number] Patient ID: [name] Williams, female   DOB: 12-25-1927, 81 y.o.   MRN: [id number]  Psychiatric Assessment Adult  Patient Identification:  [name] Williams Date of Evaluation:  03/07/2015 Chief Complaint: "I'm doing ok" History of Chief Complaint:   Chief Complaint  Patient presents with  . Depression  . Anxiety  . Follow-up    Anxiety Symptoms include nausea and nervous/anxious behavior.     this patient is an 81 year old widowed white female who lives with her son in [place]. She has one other son and a daughter as well as 5 grandchildren. 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 She is self-referred but goes to [facility] for her medical care.  The patient states that she's been in good health most of her life and has worked very hard both the tobacco fields and [employer]. 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 She states that her daughter-in-law started working there in a paid position. The patient's husband died in 12-Dec-2009 after lengthy illness. The patient decided after this to take [employer] the funds she had put in her son and daughter-in-law's name back into her own name. This is when the problem started. She thinks her daughter-in-law became angry about this and started to retaliate against her. She claims the daughter-in-law made up stories about her being out of control and agitated and rude to patrons of the nutrition site. She finally got a letter in November 2014 from the Department of aging telling her that she was no longer allowed to volunteer there.  She states that this "devastated me" all of her friends attended the site and she had been there 20 years without incident. She was unable to sleep or eat. She lost more than 30 pounds. This went on for  about a year and she eventually went to [place] last fall and was admitted for a couple of days. The doctor there put her on mirtazapine 15 mg daily at bedtime which she claims "saved my life" because she started to eat and sleep again. She is also on Wellbutrin which he thinks has helped. For a while she took Ativan and Xanax but no longer takes these.  The patient states that she decided to come to behavioral health because the problem "still eats away at me." She still doesn't talk to her daughter-in-law and his strained relationship with her's son who is married to her. She still has some trouble sleeping and only sleeps 5-6 hours a night. Is still difficult for her to eat much and she drinks an sure when she can. She has not regained much weight. She is somewhat depressed but keeps on going to her exercise classes and is very active but she claims she has to "make myself" she denies having panic attacks but is anxious. She denies suicidal ideation or any psychotic symptoms and is very bright and alert and her memory is good. Her energy is lower than it used to be. She does have a boyfriend and they spend time together each day. She is also sad because many of her friends have died or gone into nursing homes  The patient returns after 2 months. She is doing very well on the  combination of Cymbalta and mirtazapine. Her mood is improved and she doesn't worry about her daughter-in-law. She still gets out and eats at the nutrition center. She exercises 3 times a week and likes to listen to music on Saturday nights. Her weight is stabilized 135 pounds and her energy is good. She no longer needs the Xanax Review of Systems  Constitutional: Positive for activity change, appetite change and unexpected weight change.  HENT: Negative.   Respiratory: Negative.   Cardiovascular: Negative.   Gastrointestinal: Positive for nausea and diarrhea.  Endocrine: Negative.   Genitourinary: Negative.    Musculoskeletal: Negative.   Skin: Negative.   Allergic/Immunologic: Negative.   Neurological: Negative.   Hematological: Negative.   Psychiatric/Behavioral: Positive for sleep disturbance and dysphoric mood. The patient is nervous/anxious.    Physical Exam not done  Depressive Symptoms: depressed mood, anhedonia, insomnia, psychomotor retardation, anxiety, loss of energy/fatigue,  (Hypo) Manic Symptoms:   Elevated Mood:  No Irritable Mood:  No Grandiosity:  No Distractibility:  No Labiality of Mood:  No Delusions:  No Hallucinations:  No Impulsivity:  No Sexually Inappropriate Behavior:  No Financial Extravagance:  No Flight of Ideas:  No  Anxiety Symptoms: Excessive Worry:  Yes Panic Symptoms:  No Agoraphobia:  No Obsessive Compulsive: No  Symptoms: None, Specific Phobias:  No Social Anxiety:  No  Psychotic Symptoms:  Hallucinations: No None Delusions:  No Paranoia:  No   Ideas of Reference:  No  PTSD Symptoms: Ever had a traumatic exposure:  No Had a traumatic exposure in the last month:  No Re-experiencing: No None Hypervigilance:  No Hyperarousal: No None Avoidance: No None  Traumatic Brain Injury: No  Past Psychiatric History: Diagnosis: Depression and anxiety   Hospitalizations: none  Outpatient Care: none except medication management by primary care   Substance Abuse Care: none  Self-Mutilation: none  Suicidal Attempts:none  Violent Behaviors: none   Past Medical History:   Past Medical History  Diagnosis Date  . Hypertrophic obstructive cardiomyopathy   . Unspecified transient cerebral ischemia   . Esophageal reflux   . Other and unspecified hyperlipidemia   . Essential hypertension, benign   . Depressive disorder, not elsewhere classified   . Anxiety    History of Loss of Consciousness:  No Seizure History:  No Cardiac History:  No Allergies:   Allergies  Allergen Reactions  . Latex     Per pt 11-10-14 she do not remember  being allergic to this.../or   Current Medications:  Current Outpatient Prescriptions  Medication Sig Dispense Refill  . alendronate (FOSAMAX) 70 MG tablet Take 1 tablet (70 mg total) by mouth every 7 (seven) days. Take with a full glass of water on an empty stomach. 4 tablet 11  . ALPRAZolam (XANAX) 0.5 MG tablet Take 1 tablet (0.5 mg total) by mouth 2 (two) times daily as needed for anxiety. 180 tablet 0  . aspirin 81 MG EC tablet Take 81 mg by mouth.    . DULoxetine (CYMBALTA) 30 MG capsule Take 1 capsule (30 mg total) by mouth daily. 90 capsule 3  . ENSURE (ENSURE) Take 237 mLs by mouth.    . mirtazapine (REMERON) 30 MG tablet Take 1 tablet (30 mg total) by mouth at bedtime. 90 tablet 3  . Multiple Vitamin tablet Take by mouth daily.    Marland Kitchen omeprazole (PRILOSEC) 20 MG capsule Take 1 capsule (20 mg total) by mouth daily. 90 capsule 0  . simvastatin (ZOCOR) 20 MG tablet Take 1 tablet (  20 mg total) by mouth at bedtime. 90 tablet 0  . vitamin B-12 (CYANOCOBALAMIN) 500 MCG tablet Take 500 mcg by mouth as needed.      No current facility-administered medications for this visit.    Previous Psychotropic Medications:  Medication Dose   Wellbutrin XL, Remeron, Xanax                        Substance Abuse History in the last 12 months: Substance Age of 1st Use Last Use Amount Specific Type  Nicotine      Alcohol      Cannabis      Opiates      Cocaine      Methamphetamines      LSD      Ecstasy      Benzodiazepines      Caffeine      Inhalants      Others:                          Medical Consequences of Substance Abuse: none  Legal Consequences of Substance Abuse: none  Family Consequences of Substance Abuse: none  Blackouts:  No DT's:  No Withdrawal Symptoms:  No None  Social History: Current Place of Residence: [place] of Birth: [place] Family Members: 6 living siblings, 3 children, 5 grandchildren Marital Status:   Widowed Children:   Sons: 2  Daughters: 1 Relationships: Has a steady boyfriend Education:  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 Legal History: none Hobbies/Interests: Exercise class church friends  Family History:   Family History  Problem Relation Age of Onset  . Breast cancer Mother   . Stomach cancer Sister   . Uterine cancer Sister   . Depression Sister     Mental Status Examination/Evaluation: Objective:  Appearance: Casual, Neat and Well Groomed  Eye Contact::  Good  Speech:  Clear and Coherent  Volume: Normal   Mood: Good   Affect:  Bright  Thought Process:  Circumstantial and Goal Directed  Orientation:  Full (Time, Place, and Person)  Thought Content:  Rumination  Suicidal Thoughts:  No  Homicidal Thoughts:  No  Judgement:  Good  Insight:  Fair  Psychomotor Activity:  Normal  Akathisia:  No  Handed:  Right  AIMS (if indicated):    Assets:  Communication Skills Desire for Improvement Physical Health Resilience Social Support    Laboratory/X-Ray Psychological Evaluation(s)   Recent labs are all within normal limits      Assessment:  Axis I: Generalized Anxiety Disorder and Major Depression, single episode  AXIS I Generalized Anxiety Disorder and Major Depression, single episode  AXIS II Deferred  AXIS III Past Medical History  Diagnosis Date  . Hypertrophic obstructive cardiomyopathy   . Unspecified transient cerebral ischemia   . Esophageal reflux   . Other and unspecified hyperlipidemia   . Essential hypertension, benign   . Depressive disorder, not elsewhere classified   . Anxiety      AXIS IV other psychosocial or environmental problems  AXIS V 51-60 moderate symptoms   Treatment Plan/Recommendations:  Plan of Care:  Medication management   Laboratory:   Psychotherapy: She will be assigned a counselor here   Medications: . She'll continue mirtazapine 30 mg .at bedtime to  help with anxiety sleep and appetite. She will continue Cymbalta 30 mg daily for depression  Routine PRN Medications:  No  Consultations:   Safety Concerns:  She denies thoughts of harm to self or others   Other: She'll return in 3 months     [name] Spiller, MD 7/18/20162:07 PM

## 2015-03-29 ENCOUNTER — Telehealth: Payer: Self-pay | Admitting: Family

## 2015-03-29 DIAGNOSIS — K219 Gastro-esophageal reflux disease without esophagitis: Secondary | ICD-10-CM

## 2015-03-29 DIAGNOSIS — F411 Generalized anxiety disorder: Secondary | ICD-10-CM

## 2015-03-29 DIAGNOSIS — G4700 Insomnia, unspecified: Secondary | ICD-10-CM

## 2015-03-29 DIAGNOSIS — F32A Depression, unspecified: Secondary | ICD-10-CM

## 2015-03-29 DIAGNOSIS — E785 Hyperlipidemia, unspecified: Secondary | ICD-10-CM

## 2015-03-29 DIAGNOSIS — F329 Major depressive disorder, single episode, unspecified: Secondary | ICD-10-CM

## 2015-03-29 MED ORDER — OMEPRAZOLE 20 MG PO CPDR: 20.0000 mg | DELAYED_RELEASE_CAPSULE | Freq: Every day | ORAL | Status: DC

## 2015-03-29 MED ORDER — SIMVASTATIN 20 MG PO TABS: 20.0000 mg | ORAL_TABLET | Freq: Every day | ORAL | Status: DC

## 2015-03-29 MED ORDER — DULOXETINE HCL 30 MG PO CPEP: 30.0000 mg | ORAL_CAPSULE | Freq: Every day | ORAL | Status: DC

## 2015-03-29 MED ORDER — MIRTAZAPINE 30 MG PO TABS: 30.0000 mg | ORAL_TABLET | Freq: Every day | ORAL | Status: DC

## 2015-03-29 NOTE — Telephone Encounter (Signed)
Prescription ordered for mail order

## 2015-04-11 ENCOUNTER — Encounter: Payer: Self-pay | Admitting: Family

## 2015-04-11 ENCOUNTER — Ambulatory Visit (INDEPENDENT_AMBULATORY_CARE_PROVIDER_SITE_OTHER): Payer: Medicare Other | Admitting: Family

## 2015-04-11 VITALS — BP 146/82 | HR 65 | Temp 97.7°F | Ht 64.0 in | Wt 133.0 lb

## 2015-04-11 DIAGNOSIS — K219 Gastro-esophageal reflux disease without esophagitis: Secondary | ICD-10-CM

## 2015-04-11 DIAGNOSIS — M810 Age-related osteoporosis without current pathological fracture: Secondary | ICD-10-CM | POA: Diagnosis not present

## 2015-04-11 DIAGNOSIS — I10 Essential (primary) hypertension: Secondary | ICD-10-CM

## 2015-04-11 DIAGNOSIS — E785 Hyperlipidemia, unspecified: Secondary | ICD-10-CM

## 2015-04-11 DIAGNOSIS — F329 Major depressive disorder, single episode, unspecified: Secondary | ICD-10-CM | POA: Diagnosis not present

## 2015-04-11 DIAGNOSIS — G4700 Insomnia, unspecified: Secondary | ICD-10-CM

## 2015-04-11 DIAGNOSIS — F32A Depression, unspecified: Secondary | ICD-10-CM

## 2015-04-11 DIAGNOSIS — F411 Generalized anxiety disorder: Secondary | ICD-10-CM | POA: Diagnosis not present

## 2015-04-11 DIAGNOSIS — F419 Anxiety disorder, unspecified: Secondary | ICD-10-CM

## 2015-04-11 MED ORDER — OMEPRAZOLE 20 MG PO CPDR: 20.0000 mg | DELAYED_RELEASE_CAPSULE | Freq: Every day | ORAL | Status: DC

## 2015-04-11 NOTE — Patient Instructions (Signed)

## 2015-04-11 NOTE — Addendum Note (Signed)
Addended by: Evelina Dun A on: 04/11/2015 02:57 PM   Modules accepted: Orders

## 2015-04-11 NOTE — Progress Notes (Signed)
Subjective:    Patient ID: [name] Williams, female    DOB: 10-Feb-1928, 81 y.o.   MRN: [id number]  Pt presents to the office today for chronic follow up. Pt reports "feeling the best she has in a long time". Pt states she has finished her counselling and feels like the "dark clouds are gone". Pt reports sleeping better and states "I haven't cried in so long".   Hypertension This is a chronic problem. The current episode started more than 1 year ago. The problem is controlled. Associated symptoms include anxiety. Pertinent negatives include no headaches, palpitations, peripheral edema or shortness of breath. Risk factors for coronary artery disease include dyslipidemia, post-menopausal state and sedentary lifestyle. Past treatments include nothing. The current treatment provides mild improvement. Hypertensive end-organ damage includes CVA. There is no history of kidney disease, CAD/MI, heart failure or a thyroid problem. There is no history of sleep apnea.  Hyperlipidemia This is a chronic problem. The current episode started more than 1 year ago. The problem is controlled. Recent lipid tests were reviewed and are normal. She has no history of diabetes or hypothyroidism. Pertinent negatives include no leg pain or shortness of breath. Current antihyperlipidemic treatment includes statins. The current treatment provides moderate improvement of lipids. Risk factors for coronary artery disease include dyslipidemia and post-menopausal.  Anxiety Presents for follow-up visit. Onset was 1 to 6 months ago. The problem has been waxing and waning. Symptoms include depressed mood, excessive worry and panic. Patient reports no nervous/anxious behavior, palpitations or shortness of breath. Symptoms occur rarely. The symptoms are aggravated by family issues.   Her past medical history is significant for anxiety/panic attacks and depression. Past treatments include SSRIs.  Gastrophageal Reflux She reports no belching, no  heartburn or no sore throat. This is a chronic problem. The current episode started more than 1 year ago. The problem occurs frequently. The problem has been waxing and waning. The heartburn does not wake her from sleep. The symptoms are aggravated by certain foods and lying down. She has tried a PPI for the symptoms. The treatment provided significant relief.      Review of Systems  Constitutional: Negative.   HENT: Negative.  Negative for sore throat.   Eyes: Negative.   Respiratory: Negative.  Negative for shortness of breath.   Cardiovascular: Negative.  Negative for palpitations.  Gastrointestinal: Negative.  Negative for heartburn.  Endocrine: Negative.   Genitourinary: Negative.   Musculoskeletal: Negative.   Neurological: Negative.  Negative for headaches.  Hematological: Negative.   Psychiatric/Behavioral: Negative.  The patient is not nervous/anxious.   All other systems reviewed and are negative.      Objective:   Physical Exam  Constitutional: She is oriented to person, place, and time. She appears well-developed and well-nourished. No distress.  HENT:  Head: Normocephalic and atraumatic.  Right Ear: External ear normal.  Mouth/Throat: Oropharynx is clear and moist.  Eyes: Pupils are equal, round, and reactive to light.  Neck: Normal range of motion. Neck supple. No thyromegaly present.  Cardiovascular: Normal rate, regular rhythm, normal heart sounds and intact distal pulses.   No murmur heard. Pulmonary/Chest: Effort normal and breath sounds normal. No respiratory distress. She has no wheezes.  Abdominal: Soft. Bowel sounds are normal. She exhibits no distension. There is no tenderness.  Musculoskeletal: Normal range of motion. She exhibits edema (2+ in BL ankles). She exhibits no tenderness.  Neurological: She is alert and oriented to person, place, and time. She has normal reflexes. No  cranial nerve deficit.  Skin: Skin is warm and dry.  Psychiatric: She has a  normal mood and affect. Her behavior is normal. Judgment and thought content normal.  Vitals reviewed.     BP 146/82 mmHg  Pulse 65  Temp(Src) 97.7 F (36.5 C) (Oral)  Ht _0  (1.626 m)  Wt 133 lb (60.328 kg)  BMI 22.82 kg/m2     Assessment & Plan:  1. Hyperlipidemia  - CMP14+EGFR - Lipid panel  2. GAD (generalized anxiety disorder) - CMP14+EGFR  3. HYPERTENSION, BENIGN - CMP14+EGFR  4. Gastroesophageal reflux disease, esophagitis presence not specified - CMP14+EGFR - omeprazole (PRILOSEC) 20 MG capsule; Take 1 capsule (20 mg total) by mouth daily.  Dispense: 90 capsule; Refill: 3  5. Osteoporosis - CMP14+EGFR  6. Depression - CMP14+EGFR  7. Insomnia  - CMP14+EGFR  8. Anxiety - CMP14+EGFR  Stress management discussed Continue all meds Labs pending Health Maintenance reviewed Diet and exercise encouraged RTO 6 months  Evelina Dun, FNP

## 2015-04-12 LAB — CMP14+EGFR
ALK PHOS: 80 IU/L (ref 39–117)
ALT: 10 IU/L (ref 0–32)
AST: 17 IU/L (ref 0–40)
Albumin/Globulin Ratio: 1.7 (ref 1.1–2.5)
Albumin: 4.1 g/dL (ref 3.5–4.7)
BILIRUBIN TOTAL: 0.6 mg/dL (ref 0.0–1.2)
BUN/Creatinine Ratio: 20 (ref 11–26)
BUN: 21 mg/dL (ref 8–27)
CHLORIDE: 99 mmol/L (ref 97–108)
CO2: 29 mmol/L (ref 18–29)
Calcium: 9 mg/dL (ref 8.7–10.3)
Creatinine, Ser: 1.04 mg/dL — ABNORMAL HIGH (ref 0.57–1.00)
GFR calc Af Amer: 57 mL/min/{1.73_m2} — ABNORMAL LOW (ref >59)
GFR calc non Af Amer: 49 mL/min/{1.73_m2} — ABNORMAL LOW (ref >59)
GLUCOSE: 97 mg/dL (ref 65–99)
Globulin, Total: 2.4 g/dL (ref 1.5–4.5)
POTASSIUM: 4.5 mmol/L (ref 3.5–5.2)
Sodium: 141 mmol/L (ref 134–144)
TOTAL PROTEIN: 6.5 g/dL (ref 6.0–8.5)

## 2015-04-12 LAB — LIPID PANEL
CHOLESTEROL TOTAL: 136 mg/dL (ref 100–199)
Chol/HDL Ratio: 4.3 ratio units (ref 0.0–4.4)
HDL: 32 mg/dL — AB (ref >39)
LDL Calculated: 66 mg/dL (ref 0–99)
TRIGLYCERIDES: 189 mg/dL — AB (ref 0–149)
VLDL CHOLESTEROL CAL: 38 mg/dL (ref 5–40)

## 2015-05-25 DIAGNOSIS — Z23 Encounter for immunization: Secondary | ICD-10-CM | POA: Diagnosis not present

## 2015-06-01 ENCOUNTER — Telehealth: Payer: Self-pay | Admitting: Family

## 2015-06-07 ENCOUNTER — Encounter (HOSPITAL_COMMUNITY): Payer: Self-pay | Admitting: Psychiatry

## 2015-06-07 ENCOUNTER — Ambulatory Visit (INDEPENDENT_AMBULATORY_CARE_PROVIDER_SITE_OTHER): Payer: 59 | Admitting: Psychiatry

## 2015-06-07 VITALS — BP 128/66 | HR 65 | Ht 64.0 in | Wt 135.0 lb

## 2015-06-07 DIAGNOSIS — F411 Generalized anxiety disorder: Secondary | ICD-10-CM | POA: Diagnosis not present

## 2015-06-07 DIAGNOSIS — F321 Major depressive disorder, single episode, moderate: Secondary | ICD-10-CM

## 2015-06-07 NOTE — Progress Notes (Signed)
Patient ID: [name] Williams, female   DOB: 14-Oct-1927, 81 y.o.   MRN: [id number] Patient ID: [name] Williams, female   DOB: 23-Apr-1928, 81 y.o.   MRN: [id number] Patient ID: [name] Williams, female   DOB: 10-Sep-1927, 81 y.o.   MRN: [id number] Patient ID: [name] Williams, female   DOB: 1928/04/21, 81 y.o.   MRN: [id number]  Psychiatric Assessment Adult  Patient Identification:  [name] Williams Date of Evaluation:  06/07/2015 Chief Complaint: "I'm doing great History of Chief Complaint:   Chief Complaint  Patient presents with  . Depression  . Anxiety  . Follow-up    Depression        Associated symptoms include appetite change.  Past medical history includes anxiety.   Anxiety Symptoms include nausea and nervous/anxious behavior.     this patient is an 81 year old widowed white female who lives with her son in [place]. She has one other son and a daughter as well as 5 grandchildren. 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 She is self-referred but goes to [facility] for her medical care.  The patient states that she's been in good health most of her life and has worked very hard both the tobacco fields and [employer]. 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 She states that her daughter-in-law started working there in a paid position. The patient's husband died in 09-Nov-2009 after lengthy illness. The patient decided after this to take [employer] the funds she had put in her son and daughter-in-law's name back into her own name. This is when the problem started. She thinks her daughter-in-law became angry about this and started to retaliate against her. She claims the daughter-in-law made up stories about her being out of control and agitated and rude to patrons of the nutrition site. She finally got a letter in November 2014 from the Department of aging telling her that she was no longer allowed to volunteer there.  She states that  this "devastated me" all of her friends attended the site and she had been there 20 years without incident. She was unable to sleep or eat. She lost more than 30 pounds. This went on for about a year and she eventually went to [place] last fall and was admitted for a couple of days. The doctor there put her on mirtazapine 15 mg daily at bedtime which she claims "saved my life" because she started to eat and sleep again. She is also on Wellbutrin which he thinks has helped. For a while she took Ativan and Xanax but no longer takes these.  The patient states that she decided to come to behavioral health because the problem "still eats away at me." She still doesn't talk to her daughter-in-law and his strained relationship with her's son who is married to her. She still has some trouble sleeping and only sleeps 5-6 hours a night. Is still difficult for her to eat much and she drinks an sure when she can. She has not regained much weight. She is somewhat depressed but keeps on going to her exercise classes and is very active but she claims she has to "make myself" she denies having panic attacks but is anxious. She denies suicidal ideation or any psychotic symptoms and is very bright and alert and her memory is good. Her energy is lower than it used to be. She does have a boyfriend  and they spend time together each day. She is also sad because many of her friends have died or gone into nursing homes  The patient returns after 3 months. She's doing very well on the combination of Cymbalta and mirtazapine. She is eating well and sleeping well. She's added a multivitamin and her energy is very good. She no longer worries about her daughter-in-law. She met with a pastor and he gave verbal Prayers a bit very helpful. Her family nurse practitioner recently refilled all of her medications so she doesn't need any refills Review of Systems  Constitutional: Positive for activity change, appetite change and  unexpected weight change.  HENT: Negative.   Respiratory: Negative.   Cardiovascular: Negative.   Gastrointestinal: Positive for nausea and diarrhea.  Endocrine: Negative.   Genitourinary: Negative.   Musculoskeletal: Negative.   Skin: Negative.   Allergic/Immunologic: Negative.   Neurological: Negative.   Hematological: Negative.   Psychiatric/Behavioral: Positive for depression, sleep disturbance and dysphoric mood. The patient is nervous/anxious.    Physical Exam not done  Depressive Symptoms: depressed mood, anhedonia, insomnia, psychomotor retardation, anxiety, loss of energy/fatigue,  (Hypo) Manic Symptoms:   Elevated Mood:  No Irritable Mood:  No Grandiosity:  No Distractibility:  No Labiality of Mood:  No Delusions:  No Hallucinations:  No Impulsivity:  No Sexually Inappropriate Behavior:  No Financial Extravagance:  No Flight of Ideas:  No  Anxiety Symptoms: Excessive Worry:  Yes Panic Symptoms:  No Agoraphobia:  No Obsessive Compulsive: No  Symptoms: None, Specific Phobias:  No Social Anxiety:  No  Psychotic Symptoms:  Hallucinations: No None Delusions:  No Paranoia:  No   Ideas of Reference:  No  PTSD Symptoms: Ever had a traumatic exposure:  No Had a traumatic exposure in the last month:  No Re-experiencing: No None Hypervigilance:  No Hyperarousal: No None Avoidance: No None  Traumatic Brain Injury: No  Past Psychiatric History: Diagnosis: Depression and anxiety   Hospitalizations: none  Outpatient Care: none except medication management by primary care   Substance Abuse Care: none  Self-Mutilation: none  Suicidal Attempts:none  Violent Behaviors: none   Past Medical History:   Past Medical History  Diagnosis Date  . Hypertrophic obstructive cardiomyopathy   . Unspecified transient cerebral ischemia   . Esophageal reflux   . Other and unspecified hyperlipidemia   . Essential hypertension, benign   . Depressive disorder, not  elsewhere classified   . Anxiety    History of Loss of Consciousness:  No Seizure History:  No Cardiac History:  No Allergies:   Allergies  Allergen Reactions  . Latex     Per pt 11-10-14 she do not remember being allergic to this.../or   Current Medications:  Current Outpatient Prescriptions  Medication Sig Dispense Refill  . alendronate (FOSAMAX) 70 MG tablet Take 1 tablet (70 mg total) by mouth every 7 (seven) days. Take with a full glass of water on an empty stomach. 4 tablet 11  . aspirin 81 MG EC tablet Take 81 mg by mouth.    . DULoxetine (CYMBALTA) 30 MG capsule Take 1 capsule (30 mg total) by mouth daily. 90 capsule 3  . ENSURE (ENSURE) Take 237 mLs by mouth.    . mirtazapine (REMERON) 30 MG tablet Take 1 tablet (30 mg total) by mouth at bedtime. 90 tablet 3  . Multiple Vitamin tablet Take by mouth daily.    . NON FORMULARY Resetigen-D Taking 500 mg Once a Day    . omeprazole (PRILOSEC)  20 MG capsule Take 1 capsule (20 mg total) by mouth daily. 90 capsule 3  . simvastatin (ZOCOR) 20 MG tablet Take 1 tablet (20 mg total) by mouth at bedtime. 90 tablet 3   No current facility-administered medications for this visit.    Previous Psychotropic Medications:  Medication Dose   Wellbutrin XL, Remeron, Xanax                        Substance Abuse History in the last 12 months: Substance Age of 1st Use Last Use Amount Specific Type  Nicotine      Alcohol      Cannabis      Opiates      Cocaine      Methamphetamines      LSD      Ecstasy      Benzodiazepines      Caffeine      Inhalants      Others:                          Medical Consequences of Substance Abuse: none  Legal Consequences of Substance Abuse: none  Family Consequences of Substance Abuse: none  Blackouts:  No DT's:  No Withdrawal Symptoms:  No None  Social History: Current Place of Residence: [place] of Birth: [place] Family Members: 6 living  siblings, 3 children, 5 grandchildren Marital Status:  Widowed Children:   Sons: 2  Daughters: 1 Relationships: Has a steady boyfriend Education:  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 Legal History: none Hobbies/Interests: Exercise class church friends  Family History:   Family History  Problem Relation Age of Onset  . Breast cancer Mother   . Stomach cancer Sister   . Uterine cancer Sister   . Depression Sister     Mental Status Examination/Evaluation: Objective:  Appearance: Casual, Neat and Well Groomed  Eye Contact::  Good  Speech:  Clear and Coherent  Volume: Normal   Mood: Good   Affect:  Bright  Thought Process:  Circumstantial and Goal Directed  Orientation:  Full (Time, Place, and Person)  Thought Content:  Rumination  Suicidal Thoughts:  No  Homicidal Thoughts:  No  Judgement:  Good  Insight:  Fair  Psychomotor Activity:  Normal  Akathisia:  No  Handed:  Right  AIMS (if indicated):    Assets:  Communication Skills Desire for Improvement Physical Health Resilience Social Support    Laboratory/X-Ray Psychological Evaluation(s)   Recent labs are all within normal limits      Assessment:  Axis I: Generalized Anxiety Disorder and Major Depression, single episode  AXIS I Generalized Anxiety Disorder and Major Depression, single episode  AXIS II Deferred  AXIS III Past Medical History  Diagnosis Date  . Hypertrophic obstructive cardiomyopathy   . Unspecified transient cerebral ischemia   . Esophageal reflux   . Other and unspecified hyperlipidemia   . Essential hypertension, benign   . Depressive disorder, not elsewhere classified   . Anxiety      AXIS IV other psychosocial or environmental problems  AXIS V 51-60 moderate symptoms    Treatment Plan/Recommendations:  Plan of Care: Medication management   Laboratory:   Psychotherapy: She will be assigned a counselor here   Medications: . She'll  continue mirtazapine 30 mg .at bedtime to help with anxiety sleep and appetite. She will continue Cymbalta 30 mg daily for depression  Routine PRN Medications:  No  Consultations:   Safety Concerns:  She denies thoughts of harm to self or others   Other: She'll return in 4 months     Walfred Bettendorf, Neoma Laming, MD 10/18/20162:51 PM

## 2015-07-12 ENCOUNTER — Telehealth: Payer: Self-pay | Admitting: Family

## 2015-08-01 NOTE — Telephone Encounter (Signed)
Pt reported

## 2015-10-01 DIAGNOSIS — L03319 Cellulitis of trunk, unspecified: Secondary | ICD-10-CM | POA: Diagnosis not present

## 2015-10-01 DIAGNOSIS — L299 Pruritus, unspecified: Secondary | ICD-10-CM | POA: Diagnosis not present

## 2015-10-01 DIAGNOSIS — Z23 Encounter for immunization: Secondary | ICD-10-CM | POA: Diagnosis not present

## 2015-10-01 DIAGNOSIS — W57XXXA Bitten or stung by nonvenomous insect and other nonvenomous arthropods, initial encounter: Secondary | ICD-10-CM | POA: Diagnosis not present

## 2015-10-07 ENCOUNTER — Ambulatory Visit (INDEPENDENT_AMBULATORY_CARE_PROVIDER_SITE_OTHER): Payer: 59 | Admitting: Psychiatry

## 2015-10-07 ENCOUNTER — Encounter (HOSPITAL_COMMUNITY): Payer: Self-pay | Admitting: Psychiatry

## 2015-10-07 VITALS — BP 152/79 | HR 72 | Ht 64.0 in | Wt 134.2 lb

## 2015-10-07 DIAGNOSIS — G4700 Insomnia, unspecified: Secondary | ICD-10-CM

## 2015-10-07 DIAGNOSIS — F411 Generalized anxiety disorder: Secondary | ICD-10-CM

## 2015-10-07 DIAGNOSIS — F321 Major depressive disorder, single episode, moderate: Secondary | ICD-10-CM

## 2015-10-07 DIAGNOSIS — F329 Major depressive disorder, single episode, unspecified: Secondary | ICD-10-CM

## 2015-10-07 DIAGNOSIS — F32A Depression, unspecified: Secondary | ICD-10-CM

## 2015-10-07 MED ORDER — DULOXETINE HCL 30 MG PO CPEP: 30.0000 mg | ORAL_CAPSULE | Freq: Every day | ORAL | Status: DC

## 2015-10-07 MED ORDER — MIRTAZAPINE 30 MG PO TABS: 30.0000 mg | ORAL_TABLET | Freq: Every day | ORAL | Status: DC

## 2015-10-07 NOTE — Progress Notes (Signed)
Patient ID: [name] Williams, female   DOB: 1927/12/18, 82 y.o.   MRN: [id number] Patient ID: [name] Williams, female   DOB: [birth year], 82 y.o.   MRN: [id number] Patient ID: [name] Williams, female   DOB: 01-04-1928, 82 y.o.   MRN: [id number] Patient ID: [name] Williams, female   DOB: Oct 13, 1927, 82 y.o.   MRN: [id number] Patient ID: [name] Williams, female   DOB: 1928/02/19, 82 y.o.   MRN: [id number]  Psychiatric Assessment Adult  Patient Identification:  [name] Williams Date of Evaluation:  10/07/2015 Chief Complaint: "I'm doing great History of Chief Complaint:   Chief Complaint  Patient presents with  . Depression  . Anxiety  . Follow-up    Depression        Associated symptoms include appetite change.  Past medical history includes anxiety.   Anxiety Symptoms include nausea and nervous/anxious behavior.     this patient is an 82 year old widowed white female who lives with her son in [place]. She has one other son and a daughter as well as 5 grandchildren. 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 She is self-referred but goes to [facility] for her medical care.  The patient states that she's been in good health most of her life and has worked very hard both the tobacco fields and [employer]. 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 She states that her daughter-in-law started working there in a paid position. The patient's husband died in 11-26-09 after lengthy illness. The patient decided after this to take Pax[employer] the funds she had put in her son and daughter-in-law's name back into her own name. This is when the problem started. She thinks her daughter-in-law became angry about this and started to retaliate against her. She claims the daughter-in-law made up stories about her being out of control and agitated and rude to patrons of the nutrition site. She finally got a letter in November 2014 from the Department of aging telling  her that she was no longer allowed to volunteer there.  She states that this "devastated me" all of her friends attended the site and she had been there 20 years without incident. She was unable to sleep or eat. She lost more than 30 pounds. This went on for about a year and she eventually went to [place] last fall and was admitted for a couple of days. The doctor there put her on mirtazapine 15 mg daily at bedtime which she claims "saved my life" because she started to eat and sleep again. She is also on Wellbutrin which he thinks has helped. For a while she took Ativan and Xanax but no longer takes these.  The patient states that she decided to come to behavioral health because the problem "still eats away at me." She still doesn't talk to her daughter-in-law and his strained relationship with her's son who is married to her. She still has some trouble sleeping and only sleeps 5-6 hours a night. Is still difficult for her to eat much and she drinks an sure when she can. She has not regained much weight. She is somewhat depressed but keeps on going to her exercise classes and is very active but she claims she has to "make myself" she denies having panic attacks but is anxious. She denies suicidal ideation or any psychotic symptoms and is very bright and alert and her memory  is good. Her energy is lower than it used to be. She does have a boyfriend and they spend time together each day. She is also sad because many of her friends have died or gone into nursing homes  The patient returns after 3 months. She's doing very well on the combination of Cymbalta and mirtazapine. She is eating well and sleeping well. She's added a multivitamin and her energy is very good. She no longer worries about her daughter-in-law. She is staying busy and active with her boyfriend and other family members. She is very positive and upbeat today Review of Systems  Constitutional: Positive for activity change, appetite  change and unexpected weight change.  HENT: Negative.   Respiratory: Negative.   Cardiovascular: Negative.   Gastrointestinal: Positive for nausea and diarrhea.  Endocrine: Negative.   Genitourinary: Negative.   Musculoskeletal: Negative.   Skin: Negative.   Allergic/Immunologic: Negative.   Neurological: Negative.   Hematological: Negative.   Psychiatric/Behavioral: Positive for depression, sleep disturbance and dysphoric mood. The patient is nervous/anxious.    Physical Exam not done  Depressive Symptoms: depressed mood, anhedonia, insomnia, psychomotor retardation, anxiety, loss of energy/fatigue,  (Hypo) Manic Symptoms:   Elevated Mood:  No Irritable Mood:  No Grandiosity:  No Distractibility:  No Labiality of Mood:  No Delusions:  No Hallucinations:  No Impulsivity:  No Sexually Inappropriate Behavior:  No Financial Extravagance:  No Flight of Ideas:  No  Anxiety Symptoms: Excessive Worry:  Yes Panic Symptoms:  No Agoraphobia:  No Obsessive Compulsive: No  Symptoms: None, Specific Phobias:  No Social Anxiety:  No  Psychotic Symptoms:  Hallucinations: No None Delusions:  No Paranoia:  No   Ideas of Reference:  No  PTSD Symptoms: Ever had a traumatic exposure:  No Had a traumatic exposure in the last month:  No Re-experiencing: No None Hypervigilance:  No Hyperarousal: No None Avoidance: No None  Traumatic Brain Injury: No  Past Psychiatric History: Diagnosis: Depression and anxiety   Hospitalizations: none  Outpatient Care: none except medication management by primary care   Substance Abuse Care: none  Self-Mutilation: none  Suicidal Attempts:none  Violent Behaviors: none   Past Medical History:   Past Medical History  Diagnosis Date  . Hypertrophic obstructive cardiomyopathy   . Unspecified transient cerebral ischemia   . Esophageal reflux   . Other and unspecified hyperlipidemia   . Essential hypertension, benign   . Depressive  disorder, not elsewhere classified   . Anxiety    History of Loss of Consciousness:  No Seizure History:  No Cardiac History:  No Allergies:   Allergies  Allergen Reactions  . Latex     Per pt 11-10-14 she do not remember being allergic to this.../or   Current Medications:  Current Outpatient Prescriptions  Medication Sig Dispense Refill  . alendronate (FOSAMAX) 70 MG tablet Take 1 tablet (70 mg total) by mouth every 7 (seven) days. Take with a full glass of water on an empty stomach. 4 tablet 11  . aspirin 81 MG EC tablet Take 81 mg by mouth.    . DOXYCYCLINE HYCLATE PO Take 100 mg by mouth as directed.    . DULoxetine (CYMBALTA) 30 MG capsule Take 1 capsule (30 mg total) by mouth daily. 90 capsule 3  . ENSURE (ENSURE) Take 237 mLs by mouth.    . mirtazapine (REMERON) 30 MG tablet Take 1 tablet (30 mg total) by mouth at bedtime. 90 tablet 3  . Multiple Vitamin tablet Take by mouth  daily.    . NON FORMULARY Resetigen-D Taking 500 mg Once a Day    . omeprazole (PRILOSEC) 20 MG capsule Take 1 capsule (20 mg total) by mouth daily. 90 capsule 3  . simvastatin (ZOCOR) 20 MG tablet Take 1 tablet (20 mg total) by mouth at bedtime. 90 tablet 3   No current facility-administered medications for this visit.    Previous Psychotropic Medications:  Medication Dose   Wellbutrin XL, Remeron, Xanax                        Substance Abuse History in the last 12 months: Substance Age of 1st Use Last Use Amount Specific Type  Nicotine      Alcohol      Cannabis      Opiates      Cocaine      Methamphetamines      LSD      Ecstasy      Benzodiazepines      Caffeine      Inhalants      Others:                          Medical Consequences of Substance Abuse: none  Legal Consequences of Substance Abuse: none  Family Consequences of Substance Abuse: none  Blackouts:  No DT's:  No Withdrawal Symptoms:  No None  Social History: Current Place of Residence: 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 Legal History: none Hobbies/Interests: Exercise class church friends  Family History:   Family History  Problem Relation Age of Onset  . Breast cancer Mother   . Stomach cancer Sister   . Uterine cancer Sister   . Depression Sister     Mental Status Examination/Evaluation: Objective:  Appearance: Casual, Neat and Well Groomed  Eye Contact::  Good  Speech:  Clear and Coherent  Volume: Normal   Mood: Good   Affect:  Bright  Thought Process:  Circumstantial and Goal Directed  Orientation:  Full (Time, Place, and Person)  Thought Content:  Rumination  Suicidal Thoughts:  No  Homicidal Thoughts:  No  Judgement:  Good  Insight:  Fair  Psychomotor Activity:  Normal  Akathisia:  No  Handed:  Right  AIMS (if indicated):    Assets:  Communication Skills Desire for Improvement Physical Health Resilience Social Support    Laboratory/X-Ray Psychological Evaluation(s)   Recent labs are all within normal limits      Assessment:  Axis I: Generalized Anxiety Disorder and Major Depression, single episode  AXIS I Generalized Anxiety Disorder and Major Depression, single episode  AXIS II Deferred  AXIS III Past Medical History  Diagnosis Date  . Hypertrophic obstructive cardiomyopathy   . Unspecified transient cerebral ischemia   . Esophageal reflux   . Other and unspecified hyperlipidemia   . Essential hypertension, benign   . Depressive disorder, not elsewhere classified   . Anxiety      AXIS IV other  psychosocial or environmental problems  AXIS V 51-60 moderate symptoms   Treatment Plan/Recommendations:  Plan of Care:  Medication management   Laboratory:   Psychotherapy: She will be assigned a counselor here   Medications: . She'll continue mirtazapine 30 mg .at bedtime to help with anxiety sleep and appetite. She will continue Cymbalta 30 mg daily for depression  Routine PRN Medications:  No  Consultations:   Safety Concerns:  She denies thoughts of harm to self or others   Other: She'll return in 6 months     Levonne Spiller, MD 2/17/201710:57 AM

## 2015-10-13 ENCOUNTER — Ambulatory Visit (INDEPENDENT_AMBULATORY_CARE_PROVIDER_SITE_OTHER): Payer: Medicare Other | Admitting: Family

## 2015-10-13 ENCOUNTER — Encounter: Payer: Self-pay | Admitting: Family

## 2015-10-13 VITALS — BP 126/78 | HR 65 | Temp 98.3°F | Ht 64.0 in | Wt 136.2 lb

## 2015-10-13 DIAGNOSIS — I10 Essential (primary) hypertension: Secondary | ICD-10-CM

## 2015-10-13 DIAGNOSIS — R5383 Other fatigue: Secondary | ICD-10-CM | POA: Diagnosis not present

## 2015-10-13 DIAGNOSIS — K219 Gastro-esophageal reflux disease without esophagitis: Secondary | ICD-10-CM | POA: Diagnosis not present

## 2015-10-13 DIAGNOSIS — E785 Hyperlipidemia, unspecified: Secondary | ICD-10-CM | POA: Diagnosis not present

## 2015-10-13 DIAGNOSIS — F329 Major depressive disorder, single episode, unspecified: Secondary | ICD-10-CM

## 2015-10-13 DIAGNOSIS — F419 Anxiety disorder, unspecified: Secondary | ICD-10-CM

## 2015-10-13 DIAGNOSIS — F411 Generalized anxiety disorder: Secondary | ICD-10-CM

## 2015-10-13 DIAGNOSIS — G4700 Insomnia, unspecified: Secondary | ICD-10-CM | POA: Diagnosis not present

## 2015-10-13 DIAGNOSIS — M810 Age-related osteoporosis without current pathological fracture: Secondary | ICD-10-CM | POA: Diagnosis not present

## 2015-10-13 DIAGNOSIS — I639 Cerebral infarction, unspecified: Secondary | ICD-10-CM | POA: Diagnosis not present

## 2015-10-13 DIAGNOSIS — F32A Depression, unspecified: Secondary | ICD-10-CM

## 2015-10-13 NOTE — Patient Instructions (Signed)

## 2015-10-13 NOTE — Progress Notes (Signed)
Subjective:    Patient ID: [name] Williams, female    DOB: [birth year], 82 y.o.   MRN: [id number]  Pt presents to the office today for chronic follow up. Pt reports "feeling good". PT states she continues to see her councilor every 6 months and states "every just seems like it came  Together".    Hypertension This is a chronic problem. The current episode started more than 1 year ago. The problem has been resolved since onset. The problem is controlled. Associated symptoms include anxiety and peripheral edema ("my ankles some times"). Pertinent negatives include no headaches, palpitations or shortness of breath. Risk factors for coronary artery disease include dyslipidemia, post-menopausal state and sedentary lifestyle. Past treatments include nothing. The current treatment provides mild improvement. Hypertensive end-organ damage includes CVA. There is no history of kidney disease, CAD/MI, heart failure or a thyroid problem. There is no history of sleep apnea.  Depression      The patient presents with depression.  This is a chronic problem.  The current episode started more than 1 year ago.   The onset quality is gradual.   The problem occurs rarely.  The problem has been rapidly improving since onset.  Associated symptoms include no helplessness, no headaches and not sad.  Past treatments include SSRIs - Selective serotonin reuptake inhibitors.  Compliance with treatment is good.  Past medical history includes anxiety and depression.     Pertinent negatives include no hypothyroidism and no thyroid problem. Hyperlipidemia This is a chronic problem. The current episode started more than 1 year ago. The problem is controlled. Recent lipid tests were reviewed and are normal. She has no history of diabetes or hypothyroidism. Pertinent negatives include no leg pain or shortness of breath. Current antihyperlipidemic treatment includes statins. The current treatment provides moderate improvement of lipids. Risk  factors for coronary artery disease include dyslipidemia and post-menopausal.  Anxiety Presents for follow-up visit. Onset was 1 to 6 months ago. The problem has been waxing and waning. Symptoms include depressed mood, excessive worry and panic. Patient reports no nervous/anxious behavior, palpitations or shortness of breath. Symptoms occur rarely. The symptoms are aggravated by family issues.   Her past medical history is significant for anxiety/panic attacks and depression. Past treatments include SSRIs.  Gastroesophageal Reflux She reports no belching, no heartburn or no sore throat. This is a chronic problem. The current episode started more than 1 year ago. The problem occurs frequently. The problem has been waxing and waning. The heartburn does not wake her from sleep. The symptoms are aggravated by certain foods and lying down. She has tried a PPI for the symptoms. The treatment provided significant relief.      Review of Systems  Constitutional: Negative.   HENT: Negative.  Negative for sore throat.   Eyes: Negative.   Respiratory: Negative.  Negative for shortness of breath.   Cardiovascular: Negative.  Negative for palpitations.  Gastrointestinal: Negative.  Negative for heartburn.  Endocrine: Negative.   Genitourinary: Negative.   Musculoskeletal: Negative.   Neurological: Negative.  Negative for headaches.  Hematological: Negative.   Psychiatric/Behavioral: Positive for depression. The patient is not nervous/anxious.   All other systems reviewed and are negative.      Objective:   Physical Exam  Constitutional: She is oriented to person, place, and time. She appears well-developed and well-nourished. No distress.  HENT:  Head: Normocephalic and atraumatic.  Right Ear: External ear normal.  Mouth/Throat: Oropharynx is clear and moist.  Eyes: Pupils are  equal, round, and reactive to light.  Neck: Normal range of motion. Neck supple. No thyromegaly present.    Cardiovascular: Normal rate, regular rhythm, normal heart sounds and intact distal pulses.   No murmur heard. Pulmonary/Chest: Effort normal and breath sounds normal. No respiratory distress. She has no wheezes.  Abdominal: Soft. Bowel sounds are normal. She exhibits no distension. There is no tenderness.  Musculoskeletal: Normal range of motion. She exhibits edema (2+ in BL ankles). She exhibits no tenderness.  Neurological: She is alert and oriented to person, place, and time. She has normal reflexes. No cranial nerve deficit.  Skin: Skin is warm and dry.  Psychiatric: She has a normal mood and affect. Her behavior is normal. Judgment and thought content normal.  Vitals reviewed.     BP 126/78 mmHg  Pulse 65  Temp(Src) 98.3 F (36.8 C) (Oral)  Ht _0  (1.626 m)  Wt 136 lb 3.2 oz (61.78 kg)  BMI 23.37 kg/m2     Assessment & Plan:  1. HYPERTENSION, BENIGN - CMP14+EGFR  2. Cerebrovascular accident (CVA), unspecified mechanism ([place]) - CMP14+EGFR  3. Gastroesophageal reflux disease, esophagitis presence not specified - CMP14+EGFR  4. Osteoporosis - CMP14+EGFR  5. Hyperlipidemia - CMP14+EGFR - Lipid panel  6. Depression - CMP14+EGFR  7. GAD (generalized anxiety disorder) - CMP14+EGFR  8. Insomnia - CMP14+EGFR  9. Anxiety - CMP14+EGFR  10. Other fatigue - CMP14+EGFR  11. Clinical depression - CMP14+EGFR   Continue all meds Labs pending Health Maintenance reviewed Diet and exercise encouraged RTO 6 months  Evelina Dun, FNP

## 2015-10-14 LAB — CMP14+EGFR
A/G RATIO: 1.8 (ref 1.1–2.5)
ALBUMIN: 3.9 g/dL (ref 3.5–4.7)
ALT: 12 IU/L (ref 0–32)
AST: 25 IU/L (ref 0–40)
Alkaline Phosphatase: 97 IU/L (ref 39–117)
BUN/Creatinine Ratio: 16 (ref 11–26)
BUN: 23 mg/dL (ref 8–27)
Bilirubin Total: 0.4 mg/dL (ref 0.0–1.2)
CALCIUM: 9 mg/dL (ref 8.7–10.3)
CO2: 25 mmol/L (ref 18–29)
Chloride: 101 mmol/L (ref 96–106)
Creatinine, Ser: 1.4 mg/dL — ABNORMAL HIGH (ref 0.57–1.00)
GFR, EST AFRICAN AMERICAN: 40 mL/min/{1.73_m2} — AB (ref >59)
GFR, EST NON AFRICAN AMERICAN: 34 mL/min/{1.73_m2} — AB (ref >59)
GLOBULIN, TOTAL: 2.2 g/dL (ref 1.5–4.5)
Glucose: 90 mg/dL (ref 65–99)
POTASSIUM: 4.7 mmol/L (ref 3.5–5.2)
SODIUM: 142 mmol/L (ref 134–144)
TOTAL PROTEIN: 6.1 g/dL (ref 6.0–8.5)

## 2015-10-14 LAB — LIPID PANEL
CHOL/HDL RATIO: 4.2 ratio (ref 0.0–4.4)
Cholesterol, Total: 144 mg/dL (ref 100–199)
HDL: 34 mg/dL — AB (ref >39)
LDL Calculated: 59 mg/dL (ref 0–99)
TRIGLYCERIDES: 253 mg/dL — AB (ref 0–149)
VLDL Cholesterol Cal: 51 mg/dL — ABNORMAL HIGH (ref 5–40)

## 2015-10-19 ENCOUNTER — Encounter: Payer: Self-pay | Admitting: Family Medicine

## 2015-10-19 ENCOUNTER — Ambulatory Visit (INDEPENDENT_AMBULATORY_CARE_PROVIDER_SITE_OTHER): Payer: Medicare Other | Admitting: Family Medicine

## 2015-10-19 VITALS — BP 130/70 | HR 100 | Temp 101.9°F | Ht 64.0 in | Wt 136.0 lb

## 2015-10-19 DIAGNOSIS — R067 Sneezing: Secondary | ICD-10-CM | POA: Diagnosis not present

## 2015-10-19 DIAGNOSIS — R509 Fever, unspecified: Secondary | ICD-10-CM | POA: Diagnosis not present

## 2015-10-19 DIAGNOSIS — R6883 Chills (without fever): Secondary | ICD-10-CM

## 2015-10-19 DIAGNOSIS — R42 Dizziness and giddiness: Secondary | ICD-10-CM | POA: Diagnosis not present

## 2015-10-19 LAB — POCT INFLUENZA A/B
INFLUENZA A, POC: POSITIVE — AB
INFLUENZA B, POC: NEGATIVE

## 2015-10-19 MED ORDER — OSELTAMIVIR PHOSPHATE 30 MG PO CAPS: 30.0000 mg | ORAL_CAPSULE | Freq: Every day | ORAL | Status: DC

## 2015-10-19 NOTE — Progress Notes (Addendum)
   HPI  Patient presents today here with flulike illness.  Patient had flu exposure last week from her grandson.  For the last 1 day she's had chills, dizziness, trouble sleeping, sneezing and body aches. She denies any trouble breathing, tolerating foods or fluids, or chest pain. Her dizziness is described as unsteadiness and is intermittent.   PMH: Smoking status noted ROS: Per HPI  Objective: BP 130/70 mmHg  Pulse 100  Temp(Src) 101.9 F (38.8 C) (Oral)  Ht 5\' 4"  (1.626 m)  Wt 136 lb (61.689 kg)  BMI 23.33 kg/m2 Gen: NAD, alert, cooperative with exam HEENT: NCAT, MMM, TMs normal bilaterally, oropharynx clear, nares with some mild swelling CV: RRR, good S1/S2, no murmur Resp: CTABL, no wheezes, non-labored Ext: No edema, warm Neuro: Alert and oriented, No gross deficits  Assessment and plan:  # Influenza Treating with Tamiflu - renally dosed for Cr Cl of 28.5, 30 mg daily X 5 days Supportive care discussed, encouraged fluids Return to clinic for any concerns or failure to improve as expected   Orders Placed This Encounter  Procedures  . POCT Influenza A/B    Laroy Apple, MD [facility] 10/19/2015, 6:19 PM

## 2015-10-19 NOTE — Patient Instructions (Signed)
Great to meet you!  Try to drink 2-3 glasses of extra water or tea a day  Influenza, Adult Influenza ("the flu") is a viral infection of the respiratory tract. It occurs more often in winter months because people spend more time in close contact with one another. Influenza can make you feel very sick. Influenza easily spreads from person to person (contagious). CAUSES  Influenza is caused by a virus that infects the respiratory tract. You can catch the virus by breathing in droplets from an infected person's cough or sneeze. You can also catch the virus by touching something that was recently contaminated with the virus and then touching your mouth, nose, or eyes. RISKS AND COMPLICATIONS You may be at risk for a more severe case of influenza if you smoke cigarettes, have diabetes, have chronic heart disease (such as heart failure) or lung disease (such as asthma), or if you have a weakened immune system. Elderly people and pregnant women are also at risk for more serious infections. The most common problem of influenza is a lung infection (pneumonia). Sometimes, this problem can require emergency medical care and may be life threatening. SIGNS AND SYMPTOMS  Symptoms typically last 4 to 10 days and may include:  Fever.  Chills.  Headache, body aches, and muscle aches.  Sore throat.  Chest discomfort and cough.  Poor appetite.  Weakness or feeling tired.  Dizziness.  Nausea or vomiting. DIAGNOSIS  Diagnosis of influenza is often made based on your history and a physical exam. A nose or throat swab test can be done to confirm the diagnosis. TREATMENT  In mild cases, influenza goes away on its own. Treatment is directed at relieving symptoms. For more severe cases, your health care provider may prescribe antiviral medicines to shorten the sickness. Antibiotic medicines are not effective because the infection is caused by a virus, not by bacteria. HOME CARE INSTRUCTIONS  Take medicines  only as directed by your health care provider.  Use a cool mist humidifier to make breathing easier.  Get plenty of rest until your temperature returns to normal. This usually takes 3 to 4 days.  Drink enough fluid to keep your urine clear or pale yellow.  Cover yourmouth and nosewhen coughing or sneezing,and wash your handswellto prevent thevirusfrom spreading.  Stay homefromwork orschool untilthe fever is gonefor at least 57full day. PREVENTION  An annual influenza vaccination (flu shot) is the best way to avoid getting influenza. An annual flu shot is now routinely recommended for all adults in the [place] IF:  You experiencechest pain, yourcough worsens,or you producemore mucus.  Youhave nausea,vomiting, ordiarrhea.  Your fever returns or gets worse. SEEK IMMEDIATE MEDICAL CARE IF:  You havetrouble breathing, you become short of breath,or your skin ornails becomebluish.  You have severe painor stiffnessin the neck.  You develop a sudden headache, or pain in the face or ear.  You have nausea or vomiting that you cannot control. MAKE SURE YOU:   Understand these instructions.  Will watch your condition.  Will get help right away if you are not doing well or get worse.   This information is not intended to replace advice given to you by your health care provider. Make sure you discuss any questions you have with your health care provider.   Document Released: 08/03/2000 Document Revised: 08/27/2014 Document Reviewed: 11/05/2011 Elsevier Interactive Patient Education [employer].

## 2016-02-08 ENCOUNTER — Encounter: Payer: Self-pay | Admitting: Nurse Practitioner

## 2016-02-08 ENCOUNTER — Ambulatory Visit (INDEPENDENT_AMBULATORY_CARE_PROVIDER_SITE_OTHER): Payer: Medicare Other | Admitting: Nurse Practitioner

## 2016-02-08 VITALS — BP 142/76 | HR 70 | Ht 64.0 in | Wt 134.2 lb

## 2016-02-08 DIAGNOSIS — E785 Hyperlipidemia, unspecified: Secondary | ICD-10-CM | POA: Diagnosis not present

## 2016-02-08 DIAGNOSIS — R413 Other amnesia: Secondary | ICD-10-CM

## 2016-02-08 DIAGNOSIS — G459 Transient cerebral ischemic attack, unspecified: Secondary | ICD-10-CM | POA: Diagnosis not present

## 2016-02-08 NOTE — Patient Instructions (Signed)
Memory score is stable Continue aspirin for secondary stroke prevention Healthy diet and exercise for overall health Reviewed lipid profile and recent CMP from 09/2015 Follow-up yearly and when necessary

## 2016-02-08 NOTE — Progress Notes (Signed)
I agree with the evaluation and plan detailed above by Dennie Bible, NP.

## 2016-02-08 NOTE — Progress Notes (Signed)
[facility]  PATIENT: [name] Williams DOB: [birth year]   REASON FOR VISIT: Follow-up for TIA,  memory loss HISTORY FROM: Patient    HISTORY OF PRESENT ILLNESS:UPDATE 6/21/17Ms. Williams, 82 year old white female returns today for yearly followup.  She has a history of brief episodes of unawareness, slurring of speech and left facial droop, possible posterior circulatory TIAs. Her episodes last for a couple of hours with improvement, she has had no episodes in over 6 yrs. She denies any headaches. EEGs have been normal. There is no prior history of stroke or seizure or significant head injury with loss of consciousness. CT also shows mild age related atrophy. MRI of the brain showed no definite stroke. 2-D echo has been unremarkable. Carotid Dopplers have shown only mild stenosis. She returns today doing well, she remains on aspirin . She denies any side effects. Husband died several years ago from cancer. She remains independent in all activities of daily living, continues to drive. She exercises [facility] [employer] [facility]. She goes to a dance class. She lives with her son. No new neurological complaints. She continues to have mild short-term memory difficulties which appear to be unchanged. She tells me that she is seeing a counselor for her anxiety and depression.    REVIEW OF SYSTEMS: Full 14 system review of systems performed and notable only for those listed, all others are neg:  Constitutional: neg  Cardiovascular: neg Ear/Nose/Throat: neg  Skin: neg Eyes: neg Respiratory: neg Gastroitestinal: neg  Hematology/Lymphatic: neg  Endocrine: neg Musculoskeletal:neg Allergy/Immunology: neg Neurological: neg Psychiatric: neg Sleep : neg   ALLERGIES: Allergies  Allergen Reactions  . Latex     Per pt 11-10-14 she do not remember being allergic to this.../or    HOME MEDICATIONS: Outpatient Prescriptions Prior to Visit  Medication Sig Dispense Refill    . aspirin 81 MG EC tablet Take 81 mg by mouth every other day.     . DULoxetine (CYMBALTA) 30 MG capsule Take 1 capsule (30 mg total) by mouth daily. 90 capsule 3  . ENSURE (ENSURE) Take 237 mLs by mouth.    . mirtazapine (REMERON) 30 MG tablet Take 1 tablet (30 mg total) by mouth at bedtime. 90 tablet 3  . Multiple Vitamin tablet Take by mouth daily.    . mupirocin ointment (BACTROBAN) 2 % APPLY 1 APPLICATION TO AFFECTED AREA THREE TIMES A DAY EXTERNALLY 10 DAYS  1  . NON FORMULARY Resetigen-D Taking 500 mg Once a Day    . omeprazole (PRILOSEC) 20 MG capsule Take 1 capsule (20 mg total) by mouth daily. (Patient taking differently: Take 20 mg by mouth every other day. ) 90 capsule 3  . oseltamivir (TAMIFLU) 30 MG capsule Take 1 capsule (30 mg total) by mouth daily. 5 capsule 0  . simvastatin (ZOCOR) 20 MG tablet Take 1 tablet (20 mg total) by mouth at bedtime. (Patient taking differently: Take 20 mg by mouth every other day. ) 90 tablet 3  . alendronate (FOSAMAX) 70 MG tablet Take 1 tablet (70 mg total) by mouth every 7 (seven) days. Take with a full glass of water on an empty stomach. 4 tablet 11   No facility-administered medications prior to visit.    PAST MEDICAL HISTORY: Past Medical History  Diagnosis Date  . Hypertrophic obstructive cardiomyopathy   . Unspecified transient cerebral ischemia   . Esophageal reflux   . Other and unspecified hyperlipidemia   . Essential hypertension, benign   .  Depressive disorder, not elsewhere classified   . Anxiety     PAST SURGICAL HISTORY: Past Surgical History  Procedure Laterality Date  . Cervix surgery    . Bladder repair    . Cholecystectomy    . Tonsillectomy    . Tubal ligation      FAMILY HISTORY: Family History  Problem Relation Age of Onset  . Breast cancer Mother   . Stomach cancer Sister   . Uterine cancer Sister   . Depression Sister     SOCIAL HISTORY: Social History   Social History  . Marital Status: Married     Spouse Name: N/A  . Number of Children: 3  . Years of Education: N/A   Occupational History  . Not on file.   Social History Main Topics  . Smoking status: Former Research scientist (life sciences)  . Smokeless tobacco: Never Used     Comment: quit 2012  . Alcohol Use: No  . Drug Use: No  . Sexual Activity: Not Currently   Other Topics Concern  . Not on file   Social History Narrative   Patient is retired.    Patient is widowed.    Patient has 3 children.    Patient is right handed.    Pt lives with son.       PHYSICAL EXAM  Filed Vitals:   02/08/16 1311  BP: 142/76  Pulse: 70  Height: 5\' 4"  (1.626 m)  Weight: 134 lb 3.2 oz (60.873 kg)   Body mass index is 23.02 kg/(m^2). Generalized: Well developed, in no acute distress  Head: normocephalic and atraumatic,. Oropharynx benign  Neck: Supple, no carotid bruits  Cardiac: Regular rate rhythm, no murmur  Musculoskeletal: No deformity   Neurological examination   Mentation: Alert oriented to time, place, history taking. MMSE 26/30. AFT 5. Last MMSE 25/30. Follows all commands speech and language fluent  Cranial nerve II-XII: .Pupils were equal round reactive to light extraocular movements were full, visual field were full on confrontational test. Facial sensation and strength were normal. hearing was intact to finger rubbing bilaterally. Uvula tongue midline. head turning and shoulder shrug were normal and symmetric.Tongue protrusion into cheek strength was normal. Motor: normal bulk and tone, full strength in the BUE, BLE, fine finger movements normal, no pronator drift. No focal weakness Sensory: normal and symmetric to light touch, pinprick, and vibration  Coordination: finger-nose-finger, heel-to-shin bilaterally, no dysmetria Reflexes: 1+ upper lower and symmetric, plantar responses were flexor bilaterally. Gait and Station: Rising up from seated position without assistance, normal stance, moderate stride, good arm swing, smooth  turning, able to perform tiptoe, and heel walking without difficulty. Tandem gait is steady, no assistive device DIAGNOSTIC DATA (LABS, IMAGING, TESTING) - I reviewed patient records, labs, notes, testing and imaging myself where available.      Component Value Date/Time   NA 142 10/13/2015 1448   NA 140 03/31/2010 0845   K 4.7 10/13/2015 1448   CL 101 10/13/2015 1448   CO2 25 10/13/2015 1448   GLUCOSE 90 10/13/2015 1448   GLUCOSE 107* 03/31/2010 0845   BUN 23 10/13/2015 1448   BUN 16 03/31/2010 0845   CREATININE 1.40* 10/13/2015 1448   CALCIUM 9.0 10/13/2015 1448   PROT 6.1 10/13/2015 1448   PROT 5.4* 01/21/2010 0401   ALBUMIN 3.9 10/13/2015 1448   ALBUMIN 3.2* 01/21/2010 0401   AST 25 10/13/2015 1448   ALT 12 10/13/2015 1448   ALKPHOS 97 10/13/2015 1448   BILITOT 0.4 10/13/2015 1448  BILITOT 0.9 01/21/2010 0401   GFRNONAA 34* 10/13/2015 1448   GFRAA 40* 10/13/2015 1448   Lab Results  Component Value Date   CHOL 144 10/13/2015   HDL 34* 10/13/2015   LDLCALC 59 10/13/2015   TRIG 253* 10/13/2015   CHOLHDL 4.2 10/13/2015        ASSESSMENT AND PLAN 82 y.o. year old female has a past medical history of episodes of altered awareness with slurred speech and left facial droop, these were felt to represent posterior circulatory TIAs. No episodes in 6 years. Carotid Doppler  negative for significant stenosis. The patient is a current patient of Dr. Leonie Man who is out of the office today . This note is sent to the work in doctor.   Memory score is stable Continue aspirin for secondary stroke prevention Healthy diet and exercise for overall health Reviewed lipid profile and recent CMP from 09/2015 Follow-up yearly and when necessary, next with Dr. Lenis Dickinson Cecille Rubin, [facility], [facility], [facility] [address], [place], [place] [address] [phone] LI1

## 2016-03-30 ENCOUNTER — Encounter (HOSPITAL_COMMUNITY): Payer: Self-pay | Admitting: Psychiatry

## 2016-03-30 ENCOUNTER — Ambulatory Visit (INDEPENDENT_AMBULATORY_CARE_PROVIDER_SITE_OTHER): Payer: 59 | Admitting: Psychiatry

## 2016-03-30 VITALS — BP 148/73 | HR 71 | Ht 64.0 in | Wt 135.6 lb

## 2016-03-30 DIAGNOSIS — G4700 Insomnia, unspecified: Secondary | ICD-10-CM | POA: Diagnosis not present

## 2016-03-30 DIAGNOSIS — F411 Generalized anxiety disorder: Secondary | ICD-10-CM

## 2016-03-30 DIAGNOSIS — F329 Major depressive disorder, single episode, unspecified: Secondary | ICD-10-CM

## 2016-03-30 DIAGNOSIS — F321 Major depressive disorder, single episode, moderate: Secondary | ICD-10-CM | POA: Diagnosis not present

## 2016-03-30 DIAGNOSIS — F32A Depression, unspecified: Secondary | ICD-10-CM

## 2016-03-30 MED ORDER — DULOXETINE HCL 30 MG PO CPEP: 30.0000 mg | ORAL_CAPSULE | Freq: Every day | ORAL | 3 refills | Status: DC

## 2016-03-30 MED ORDER — MIRTAZAPINE 30 MG PO TABS: 30.0000 mg | ORAL_TABLET | Freq: Every day | ORAL | 3 refills | Status: DC

## 2016-03-30 NOTE — Progress Notes (Signed)
Patient ID: [name] Williams, female   DOB: 04-15-1928, 82 y.o.   MRN: [id number] Patient ID: [name] Williams, female   DOB: 05-15-1928, 82 y.o.   MRN: [id number] Patient ID: [name] Williams, female   DOB: [birth year], 82 y.o.   MRN: [id number] Patient ID: [name] Williams, female   DOB: 1928-06-18, 82 y.o.   MRN: [id number] Patient ID: [name] Williams, female   DOB: 12/12/1927, 82 y.o.   MRN: [id number]  Psychiatric Assessment Adult  Patient Identification:  [name] Williams Date of Evaluation:  03/30/2016 Chief Complaint: "I'm doing great History of Chief Complaint:   No chief complaint on file.   Depression         Associated symptoms include appetite change.  Past medical history includes anxiety.   Anxiety  Symptoms include nausea and nervous/anxious behavior.     this patient is an 82 year old widowed white female who lives with her son in [place]. She has one other son and a daughter as well as 5 grandchildren. 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 She is self-referred but goes to [facility] for her medical care.  The patient states that she's been in good health most of her life and has worked very hard both the tobacco fields and [employer]. 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 She states that her daughter-in-law started working there in a paid position. The patient's husband died in December 14, 2009 after lengthy illness. The patient decided after this to take [employer] the funds she had put in her son and daughter-in-law's name back into her own name. This is when the problem started. She thinks her daughter-in-law became angry about this and started to retaliate against her. She claims the daughter-in-law made up stories about her being out of control and agitated and rude to patrons of the nutrition site. She finally got a letter in November 2014 from the Department of aging telling her that she was no longer allowed to volunteer  there.  She states that this "devastated me" all of her friends attended the site and she had been there 20 years without incident. She was unable to sleep or eat. She lost more than 30 pounds. This went on for about a year and she eventually went to [facility] last fall and was admitted for a couple of days. The doctor there put her on mirtazapine 15 mg daily at bedtime which she claims "saved my life" because she started to eat and sleep again. She is also on Wellbutrin which he thinks has helped. For a while she took Ativan and Xanax but no longer takes these.  The patient states that she decided to come to behavioral health because the problem "still eats away at me." She still doesn't talk to her daughter-in-law and his strained relationship with her's son who is married to her. She still has some trouble sleeping and only sleeps 5-6 hours a night. Is still difficult for her to eat much and she drinks an sure when she can. She has not regained much weight. She is somewhat depressed but keeps on going to her exercise classes and is very active but she claims she has to "make myself" she denies having panic attacks but is anxious. She denies suicidal ideation or any psychotic symptoms and is very bright and alert and her memory is good. Her energy is lower than it used  to be. She does have a boyfriend and they spend time together each day. She is also sad because many of her friends have died or gone into nursing homes  The patient returns after 6 months. She's doing very well on the combination of Cymbalta and mirtazapine. She is eating well and sleeping well. She's added a multivitamin and her energy is very good. She no longer worries about her daughter-in-law. She is staying busy and active with her boyfriend and other family members. She is very positive and upbeat today. She has had some stress with her son who is sick and with her car breaking down but she is handling everything quite  well Review of Systems  Constitutional: Positive for activity change, appetite change and unexpected weight change.  HENT: Negative.   Respiratory: Negative.   Cardiovascular: Negative.   Gastrointestinal: Positive for diarrhea and nausea.  Endocrine: Negative.   Genitourinary: Negative.   Musculoskeletal: Negative.   Skin: Negative.   Allergic/Immunologic: Negative.   Neurological: Negative.   Hematological: Negative.   Psychiatric/Behavioral: Positive for depression, dysphoric mood and sleep disturbance. The patient is nervous/anxious.    Physical Exam not done  Depressive Symptoms: depressed mood, anhedonia, insomnia, psychomotor retardation, anxiety, loss of energy/fatigue,  (Hypo) Manic Symptoms:   Elevated Mood:  No Irritable Mood:  No Grandiosity:  No Distractibility:  No Labiality of Mood:  No Delusions:  No Hallucinations:  No Impulsivity:  No Sexually Inappropriate Behavior:  No Financial Extravagance:  No Flight of Ideas:  No  Anxiety Symptoms: Excessive Worry:  Yes Panic Symptoms:  No Agoraphobia:  No Obsessive Compulsive: No  Symptoms: None, Specific Phobias:  No Social Anxiety:  No  Psychotic Symptoms:  Hallucinations: No None Delusions:  No Paranoia:  No   Ideas of Reference:  No  PTSD Symptoms: Ever had a traumatic exposure:  No Had a traumatic exposure in the last month:  No Re-experiencing: No None Hypervigilance:  No Hyperarousal: No None Avoidance: No None  Traumatic Brain Injury: No  Past Psychiatric History: Diagnosis: Depression and anxiety   Hospitalizations: none  Outpatient Care: none except medication management by primary care   Substance Abuse Care: none  Self-Mutilation: none  Suicidal Attempts:none  Violent Behaviors: none   Past Medical History:   Past Medical History:  Diagnosis Date  . Anxiety   . Depressive disorder, not elsewhere classified   . Esophageal reflux   . Essential hypertension, benign   .  Hypertrophic obstructive cardiomyopathy   . Other and unspecified hyperlipidemia   . Unspecified transient cerebral ischemia    History of Loss of Consciousness:  No Seizure History:  No Cardiac History:  No Allergies:   Allergies  Allergen Reactions  . Latex     Per pt 11-10-14 she do not remember being allergic to this.../or   Current Medications:  Current Outpatient Prescriptions  Medication Sig Dispense Refill  . aspirin 81 MG EC tablet Take 81 mg by mouth every other day.     . DULoxetine (CYMBALTA) 30 MG capsule Take 1 capsule (30 mg total) by mouth daily. 90 capsule 3  . ENSURE (ENSURE) Take 237 mLs by mouth.    . mirtazapine (REMERON) 30 MG tablet Take 1 tablet (30 mg total) by mouth at bedtime. 90 tablet 3  . Multiple Vitamin tablet Take by mouth daily.    . NON FORMULARY Resetigen-D Taking 500 mg Once a Day    . omeprazole (PRILOSEC) 20 MG capsule Take 1 capsule (20 mg  total) by mouth daily. (Patient taking differently: Take 20 mg by mouth every other day. ) 90 capsule 3  . simvastatin (ZOCOR) 20 MG tablet Take 1 tablet (20 mg total) by mouth at bedtime. (Patient taking differently: Take 20 mg by mouth every other day. ) 90 tablet 3   No current facility-administered medications for this visit.     Previous Psychotropic Medications:  Medication Dose   Wellbutrin XL, Remeron, Xanax                        Substance Abuse History in the last 12 months: Substance Age of 1st Use Last Use Amount Specific Type  Nicotine      Alcohol      Cannabis      Opiates      Cocaine      Methamphetamines      LSD      Ecstasy      Benzodiazepines      Caffeine      Inhalants      Others:                          Medical Consequences of Substance Abuse: none  Legal Consequences of Substance Abuse: none  Family Consequences of Substance Abuse: none  Blackouts:  No DT's:  No Withdrawal Symptoms:  No None  Social History: Current Place of Residence: [place] Birth: [place] Family Members: 6 living siblings, 3 children, 5 grandchildren Marital Status:  Widowed Children:   Sons: 2  Daughters: 1 Relationships: Has a steady boyfriend Education:  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 Legal History: none Hobbies/Interests: Exercise class church friends  Family History:   Family History  Problem Relation Age of Onset  . Breast cancer Mother   . Stomach cancer Sister   . Uterine cancer Sister   . Depression Sister     Mental Status Examination/Evaluation: Objective:  Appearance: Casual, Neat and Well Groomed  Eye Contact::  Good  Speech:  Clear and Coherent  Volume: Normal   Mood: Good   Affect:  Bright  Thought Process:  Circumstantial and Goal Directed  Orientation:  Full (Time, Place, and Person)  Thought Content:  Rumination  Suicidal Thoughts:  No  Homicidal Thoughts:  No  Judgement:  Good  Insight:  Fair  Psychomotor Activity:  Normal  Akathisia:  No  Handed:  Right  AIMS (if indicated):    Assets:  Communication Skills Desire for Improvement Physical Health Resilience Social Support    Laboratory/X-Ray Psychological Evaluation(s)   Recent labs are all within normal limits      Assessment:  Axis I: Generalized Anxiety Disorder and Major Depression, single episode  AXIS I Generalized Anxiety Disorder and Major Depression, single episode  AXIS II Deferred  AXIS III Past Medical History:  Diagnosis Date  . Anxiety   . Depressive disorder, not elsewhere classified   . Esophageal reflux   . Essential hypertension, benign   . Hypertrophic obstructive cardiomyopathy   . Other and unspecified hyperlipidemia   . Unspecified transient cerebral ischemia      AXIS  IV other psychosocial or environmental problems  AXIS V 51-60 moderate symptoms   Treatment Plan/Recommendations:  Plan of Care: Medication management  Laboratory:   Psychotherapy: She will be assigned a counselor here   Medications: . She'll continue mirtazapine 30 mg .at bedtime to help with anxiety sleep and appetite. She will continue Cymbalta 30 mg daily for depression  Routine PRN Medications:  No  Consultations:   Safety Concerns:  She denies thoughts of harm to self or others   Other: She'll return in 6 months     Levonne Spiller, MD 8/11/201710:33 AM

## 2016-04-12 ENCOUNTER — Encounter: Payer: Self-pay | Admitting: Family

## 2016-04-12 ENCOUNTER — Ambulatory Visit (INDEPENDENT_AMBULATORY_CARE_PROVIDER_SITE_OTHER): Payer: Medicare Other | Admitting: Family

## 2016-04-12 VITALS — BP 126/77 | HR 73 | Temp 98.2°F | Ht 64.0 in | Wt 135.4 lb

## 2016-04-12 DIAGNOSIS — F329 Major depressive disorder, single episode, unspecified: Secondary | ICD-10-CM

## 2016-04-12 DIAGNOSIS — M810 Age-related osteoporosis without current pathological fracture: Secondary | ICD-10-CM | POA: Diagnosis not present

## 2016-04-12 DIAGNOSIS — Z23 Encounter for immunization: Secondary | ICD-10-CM

## 2016-04-12 DIAGNOSIS — I10 Essential (primary) hypertension: Secondary | ICD-10-CM

## 2016-04-12 DIAGNOSIS — G4700 Insomnia, unspecified: Secondary | ICD-10-CM | POA: Diagnosis not present

## 2016-04-12 DIAGNOSIS — E785 Hyperlipidemia, unspecified: Secondary | ICD-10-CM

## 2016-04-12 DIAGNOSIS — F419 Anxiety disorder, unspecified: Secondary | ICD-10-CM | POA: Diagnosis not present

## 2016-04-12 DIAGNOSIS — F32A Depression, unspecified: Secondary | ICD-10-CM

## 2016-04-12 DIAGNOSIS — K219 Gastro-esophageal reflux disease without esophagitis: Secondary | ICD-10-CM | POA: Diagnosis not present

## 2016-04-12 NOTE — Addendum Note (Signed)
Addended by: Shelbie Ammons on: 04/12/2016 02:48 PM   Modules accepted: Orders

## 2016-04-12 NOTE — Progress Notes (Signed)
Subjective:    Patient ID: [name] Williams, female    DOB: 14-Sep-1927, 82 y.o.   MRN: [id number]  Pt presents to the office today for chronic follow up. Pt reports "feeling good". PT states she continues to see her councilor/behavior health  every 6 months GAD, Depression, and Insomnia.   Hypertension  This is a chronic problem. The current episode started more than 1 year ago. The problem has been resolved since onset. The problem is controlled. Associated symptoms include anxiety and peripheral edema ("my ankles some times"). Pertinent negatives include no headaches, palpitations or shortness of breath. Risk factors for coronary artery disease include dyslipidemia, post-menopausal state and sedentary lifestyle. Past treatments include nothing. The current treatment provides mild improvement. Hypertensive end-organ damage includes CVA. There is no history of kidney disease, CAD/MI, heart failure or a thyroid problem. There is no history of sleep apnea.  Hyperlipidemia  This is a chronic problem. The current episode started more than 1 year ago. The problem is controlled. Recent lipid tests were reviewed and are normal. She has no history of diabetes or hypothyroidism. Pertinent negatives include no leg pain or shortness of breath. Current antihyperlipidemic treatment includes statins. The current treatment provides moderate improvement of lipids. Risk factors for coronary artery disease include dyslipidemia and post-menopausal.  Depression       The patient presents with depression.  This is a chronic problem.  The current episode started more than 1 year ago.   The onset quality is gradual.   The problem occurs rarely.  The problem has been waxing and waning since onset.  Associated symptoms include irritable and restlessness.  Associated symptoms include no helplessness, no headaches and not sad.  Past treatments include SSRIs - Selective serotonin reuptake inhibitors.  Compliance with treatment is good.   Past medical history includes anxiety and depression.     Pertinent negatives include no hypothyroidism and no thyroid problem. Anxiety  Presents for follow-up visit. Onset was 1 to 6 months ago. The problem has been waxing and waning. Symptoms include depressed mood, excessive worry, irritability, nervous/anxious behavior, panic and restlessness. Patient reports no palpitations or shortness of breath. Symptoms occur occasionally. The symptoms are aggravated by family issues.   Her past medical history is significant for anxiety/panic attacks and depression. Past treatments include SSRIs.  Gastroesophageal Reflux  She reports no belching, no heartburn or no sore throat. This is a chronic problem. The current episode started more than 1 year ago. The problem occurs frequently. The problem has been waxing and waning. The heartburn does not wake her from sleep. The symptoms are aggravated by certain foods and lying down. She has tried a PPI for the symptoms. The treatment provided significant relief.  Osteoporosis PT's last Bone Density was 01/07/15. Pt does weight bearing exercise 3 times a week.    Review of Systems  Constitutional: Positive for irritability.  HENT: Negative.  Negative for sore throat.   Eyes: Negative.   Respiratory: Negative.  Negative for shortness of breath.   Cardiovascular: Negative.  Negative for palpitations.  Gastrointestinal: Negative.  Negative for heartburn.  Endocrine: Negative.   Genitourinary: Negative.   Musculoskeletal: Negative.   Neurological: Negative.  Negative for headaches.  Hematological: Negative.   Psychiatric/Behavioral: Positive for depression. The patient is nervous/anxious.   All other systems reviewed and are negative.      Objective:   Physical Exam  Constitutional: She is oriented to person, place, and time. She appears well-developed and well-nourished. She  is irritable. No distress.  HENT:  Head: Normocephalic and atraumatic.    Right Ear: External ear normal.  Mouth/Throat: Oropharynx is clear and moist.  Eyes: Pupils are equal, round, and reactive to light.  Neck: Normal range of motion. Neck supple. No thyromegaly present.  Cardiovascular: Normal rate, regular rhythm, normal heart sounds and intact distal pulses.   No murmur heard. Pulmonary/Chest: Effort normal and breath sounds normal. No respiratory distress. She has no wheezes.  Abdominal: Soft. Bowel sounds are normal. She exhibits no distension. There is no tenderness.  Musculoskeletal: Normal range of motion. She exhibits edema (2+ in BL ankles). She exhibits no tenderness.  Neurological: She is alert and oriented to person, place, and time. She has normal reflexes. No cranial nerve deficit.  Skin: Skin is warm and dry.  Psychiatric: She has a normal mood and affect. Her behavior is normal. Judgment and thought content normal.  Vitals reviewed.     BP 126/77   Pulse 73   Temp 98.2 F (36.8 C) (Oral)   Ht _0  (1.626 m)   Wt 135 lb 6.4 oz (61.4 kg)   BMI 23.24 kg/m      Assessment & Plan:  1. HYPERTENSION, BENIGN - CMP14+EGFR  2. Gastroesophageal reflux disease, esophagitis presence not specified - CMP14+EGFR  3. Osteoporosis - CMP14+EGFR  4. Anxiety - CMP14+EGFR  5. Depression - CMP14+EGFR  6. Insomnia - CMP14+EGFR  7. Hyperlipidemia - CMP14+EGFR - Lipid panel   Continue all meds Labs pending Health Maintenance reviewed Diet and exercise encouraged RTO 6 months  Evelina Dun, FNP

## 2016-04-12 NOTE — Patient Instructions (Signed)
Stress and Stress Management Stress is a normal reaction to life events. It is what you feel when life demands more than you are used to or more than you can handle. Some stress can be useful. For example, the stress reaction can help you catch the last bus of the day, study for a test, or meet a deadline at work. But stress that occurs too often or for too long can cause problems. It can affect your emotional health and interfere with relationships and normal daily activities. Too much stress can weaken your immune system and increase your risk for physical illness. If you already have a medical problem, stress can make it worse. CAUSES  All sorts of life events may cause stress. An event that causes stress for one person may not be stressful for another person. Major life events commonly cause stress. These may be positive or negative. Examples include losing your job, moving into a new home, getting married, having a baby, or losing a loved one. Less obvious life events may also cause stress, especially if they occur day after day or in combination. Examples include working long hours, driving in traffic, caring for children, being in debt, or being in a difficult relationship. SIGNS AND SYMPTOMS Stress may cause emotional symptoms including, the following:  Anxiety. This is feeling worried, afraid, on edge, overwhelmed, or out of control.  Anger. This is feeling irritated or impatient.  Depression. This is feeling sad, down, helpless, or guilty.  Difficulty focusing, remembering, or making decisions. Stress may cause physical symptoms, including the following:   Aches and pains. These may affect your head, neck, back, stomach, or other areas of your body.  Tight muscles or clenched jaw.  Low energy or trouble sleeping. Stress may cause unhealthy behaviors, including the following:   Eating to feel better (overeating) or skipping meals.  Sleeping too little, too much, or both.  Working  too much or putting off tasks (procrastination).  Smoking, drinking alcohol, or using drugs to feel better. DIAGNOSIS  Stress is diagnosed through an assessment by your health care provider. Your health care provider will ask questions about your symptoms and any stressful life events.Your health care provider will also ask about your medical history and may order blood tests or other tests. Certain medical conditions and medicine can cause physical symptoms similar to stress. Mental illness can cause emotional symptoms and unhealthy behaviors similar to stress. Your health care provider may refer you to a mental health professional for further evaluation.  TREATMENT  Stress management is the recommended treatment for stress.The goals of stress management are reducing stressful life events and coping with stress in healthy ways.  Techniques for reducing stressful life events include the following:  Stress identification. Self-monitor for stress and identify what causes stress for you. These skills may help you to avoid some stressful events.  Time management. Set your priorities, keep a calendar of events, and learn to say "no." These tools can help you avoid making too many commitments. Techniques for coping with stress include the following:  Rethinking the problem. Try to think realistically about stressful events rather than ignoring them or overreacting. Try to find the positives in a stressful situation rather than focusing on the negatives.  Exercise. Physical exercise can release both physical and emotional tension. The key is to find a form of exercise you enjoy and do it regularly.  Relaxation techniques. These relax the body and mind. Examples include yoga, meditation, tai chi, biofeedback, deep  breathing, progressive muscle relaxation, listening to music, being out in nature, journaling, and other hobbies. Again, the key is to find one or more that you enjoy and can do  regularly.  Healthy lifestyle. Eat a balanced diet, get plenty of sleep, and do not smoke. Avoid using alcohol or drugs to relax.  Strong support network. Spend time with family, friends, or other people you enjoy being around.Express your feelings and talk things over with someone you trust. Counseling or talktherapy with a mental health professional may be helpful if you are having difficulty managing stress on your own. Medicine is typically not recommended for the treatment of stress.Talk to your health care provider if you think you need medicine for symptoms of stress. HOME CARE INSTRUCTIONS  Keep all follow-up visits as directed by your health care provider.  Take all medicines as directed by your health care provider. SEEK MEDICAL CARE IF:  Your symptoms get worse or you start having new symptoms.  You feel overwhelmed by your problems and can no longer manage them on your own. SEEK IMMEDIATE MEDICAL CARE IF:  You feel like hurting yourself or someone else.   This information is not intended to replace advice given to you by your health care provider. Make sure you discuss any questions you have with your health care provider.   Document Released: 01/30/2001 Document Revised: 08/27/2014 Document Reviewed: 03/31/2013 Elsevier Interactive Patient Education 2016 Elsevier Inc.  

## 2016-04-13 LAB — LIPID PANEL
CHOLESTEROL TOTAL: 128 mg/dL (ref 100–199)
Chol/HDL Ratio: 4 ratio units (ref 0.0–4.4)
HDL: 32 mg/dL — AB (ref >39)
LDL CALC: 50 mg/dL (ref 0–99)
Triglycerides: 229 mg/dL — ABNORMAL HIGH (ref 0–149)
VLDL CHOLESTEROL CAL: 46 mg/dL — AB (ref 5–40)

## 2016-04-13 LAB — CMP14+EGFR
ALBUMIN: 4.2 g/dL (ref 3.5–4.7)
ALT: 11 IU/L (ref 0–32)
AST: 23 IU/L (ref 0–40)
Albumin/Globulin Ratio: 1.8 (ref 1.2–2.2)
Alkaline Phosphatase: 83 IU/L (ref 39–117)
BUN / CREAT RATIO: 18 (ref 12–28)
BUN: 21 mg/dL (ref 8–27)
Bilirubin Total: 0.5 mg/dL (ref 0.0–1.2)
CHLORIDE: 100 mmol/L (ref 96–106)
CO2: 28 mmol/L (ref 18–29)
CREATININE: 1.14 mg/dL — AB (ref 0.57–1.00)
Calcium: 9.2 mg/dL (ref 8.7–10.3)
GFR calc non Af Amer: 44 mL/min/{1.73_m2} — ABNORMAL LOW (ref >59)
GFR, EST AFRICAN AMERICAN: 50 mL/min/{1.73_m2} — AB (ref >59)
GLUCOSE: 107 mg/dL — AB (ref 65–99)
Globulin, Total: 2.3 g/dL (ref 1.5–4.5)
Potassium: 4.8 mmol/L (ref 3.5–5.2)
Sodium: 141 mmol/L (ref 134–144)
TOTAL PROTEIN: 6.5 g/dL (ref 6.0–8.5)

## 2016-04-16 ENCOUNTER — Telehealth: Payer: Self-pay | Admitting: Family

## 2016-04-17 MED ORDER — PRAVASTATIN SODIUM 20 MG PO TABS: 20.0000 mg | ORAL_TABLET | Freq: Every day | ORAL | 3 refills | Status: DC

## 2016-04-17 NOTE — Telephone Encounter (Signed)
I have sent in pravastatin Prescription

## 2016-04-17 NOTE — Telephone Encounter (Signed)
Pt aware.

## 2016-04-18 ENCOUNTER — Telehealth: Payer: Self-pay | Admitting: Family

## 2016-04-18 ENCOUNTER — Other Ambulatory Visit: Payer: Self-pay | Admitting: Family

## 2016-04-18 MED ORDER — PRAVASTATIN SODIUM 20 MG PO TABS: 20.0000 mg | ORAL_TABLET | Freq: Every day | ORAL | 3 refills | Status: DC

## 2016-04-18 NOTE — Telephone Encounter (Signed)
Patient states she wants her pravastatin 20mg  sent to [facility] and not the mail service. Patient states that she will call and cancel her order with the mail service. New Rx sent to [facility] as requested.

## 2016-04-19 ENCOUNTER — Other Ambulatory Visit (HOSPITAL_COMMUNITY): Payer: Self-pay | Admitting: Psychiatry

## 2016-04-19 NOTE — Telephone Encounter (Signed)
Pt is followed by behavioral health closely. She will need to get medications refilled with them.

## 2016-04-19 NOTE — Telephone Encounter (Signed)
[facility] will contact Dr Harrington Challenger for RX

## 2016-04-19 NOTE — Telephone Encounter (Signed)
Pt requesting refills on xanax 0.5mg  tab. Last given #60 take 1 PO bid with 3 refills 04/11/15. If approved please send to nurse pool A for nurse to call in.

## 2016-04-25 DIAGNOSIS — Z961 Presence of intraocular lens: Secondary | ICD-10-CM | POA: Diagnosis not present

## 2016-04-25 DIAGNOSIS — H04123 Dry eye syndrome of bilateral lacrimal glands: Secondary | ICD-10-CM | POA: Diagnosis not present

## 2016-04-25 DIAGNOSIS — Z9849 Cataract extraction status, unspecified eye: Secondary | ICD-10-CM | POA: Diagnosis not present

## 2016-04-30 ENCOUNTER — Telehealth: Payer: Self-pay | Admitting: Family

## 2016-05-19 ENCOUNTER — Other Ambulatory Visit: Payer: Self-pay | Admitting: Family

## 2016-05-19 DIAGNOSIS — K219 Gastro-esophageal reflux disease without esophagitis: Secondary | ICD-10-CM

## 2016-05-31 ENCOUNTER — Encounter: Payer: Self-pay | Admitting: Pediatrics

## 2016-05-31 ENCOUNTER — Ambulatory Visit (INDEPENDENT_AMBULATORY_CARE_PROVIDER_SITE_OTHER): Payer: Medicare Other | Admitting: Pediatrics

## 2016-05-31 VITALS — BP 126/80 | HR 80 | Temp 97.7°F | Ht 64.0 in | Wt 132.6 lb

## 2016-05-31 DIAGNOSIS — E785 Hyperlipidemia, unspecified: Secondary | ICD-10-CM

## 2016-05-31 DIAGNOSIS — F339 Major depressive disorder, recurrent, unspecified: Secondary | ICD-10-CM

## 2016-05-31 MED ORDER — CITALOPRAM HYDROBROMIDE 10 MG PO TABS: 10.0000 mg | ORAL_TABLET | Freq: Every day | ORAL | 3 refills | Status: DC

## 2016-05-31 NOTE — Patient Instructions (Signed)
Stop simvastatin Start citalopram 10mg  for mood

## 2016-05-31 NOTE — Progress Notes (Signed)
  Subjective:   Patient ID: [name] Williams, female    DOB: 31-Aug-1927, 82 y.o.   MRN: [id number] CC: Depression  HPI: [name] Williams is a 82 y.o. female presenting for Depression  Depression: Stopped cymbalta about two months ago because too expensive Having a harder time getting out of bed Has to push herself to do anything, isnt interested in things she usually does  HLD:  Was taking both simvastatin occasionally and pravastatin at home Does have a h/o stroke   Relevant past medical, surgical, family and social history reviewed. Allergies and medications reviewed and updated. History  Smoking Status  . Former Smoker  Smokeless Tobacco  . Never Used    Comment: quit 2012   ROS: Per HPI   Objective:    BP 126/80   Pulse 80   Temp 97.7 F (36.5 C) (Oral)   Ht 5\' 4"  (1.626 m)   Wt 132 lb 9.6 oz (60.1 kg)   BMI 22.76 kg/m   Wt Readings from Last 3 Encounters:  05/31/16 132 lb 9.6 oz (60.1 kg)  04/12/16 135 lb 6.4 oz (61.4 kg)  03/30/16 135 lb 9.6 oz (61.5 kg)    Gen: NAD, alert, cooperative with exam, NCAT EYES: EOMI, no conjunctival injection, or no icterus CV: NRRR, normal S1/S2, no murmur, distal pulses 2+ b/l Resp: CTABL, no wheezes, normal WOB Ext: No edema, warm Neuro: Alert and oriented Psych: normal affect, mood down, feels safe at home  Assessment & Plan:  [name] Williams was seen today for depression.  Diagnoses and all orders for this visit:  Episode of recurrent major depressive disorder, unspecified depression episode severity ([place]) Cymbalta too expensive Depression symptoms worsened since being off, wants to start alternate medication Continue mirtazepine, may also be getting too expensive Has follow up with Dr. Harrington Challenger -     citalopram (CELEXA) 10 MG tablet; Take 1 tablet (10 mg total) by mouth daily.  Hyperlipidemia, unspecified hyperlipidemia type Stop simvastatin, cont pravastatin  Follow up plan: Return in about 3 months (around 08/31/2016) for med  follow up with PCP. Assunta Found, MD [place]

## 2016-07-13 ENCOUNTER — Ambulatory Visit (INDEPENDENT_AMBULATORY_CARE_PROVIDER_SITE_OTHER): Payer: Medicare Other | Admitting: Family Medicine

## 2016-07-13 ENCOUNTER — Encounter: Payer: Self-pay | Admitting: Family Medicine

## 2016-07-13 VITALS — BP 125/67 | HR 79 | Temp 97.2°F | Ht 64.0 in | Wt 135.0 lb

## 2016-07-13 DIAGNOSIS — J029 Acute pharyngitis, unspecified: Secondary | ICD-10-CM

## 2016-07-13 LAB — CULTURE, GROUP A STREP

## 2016-07-13 LAB — RAPID STREP SCREEN (MED CTR MEBANE ONLY): STREP GP A AG, IA W/REFLEX: NEGATIVE

## 2016-07-13 NOTE — Progress Notes (Signed)
Subjective:  Patient ID: [name] Williams, female    DOB: [birth year]  Age: 82 y.o. MRN: [id number]  CC: Sore Throat (pt here today c/o sore throat at night but during the day she is fine)   HPI [name] Williams presents for Patient presents with upper respiratory congestion. There is moderate sore throat. There is no fever no chills no sweats. The patient deniesCough and being short of breath. Onset was 5 days ago. Gradually worsening in spite of home remedies.    History [name] Williams has a past medical history of Anxiety; Depressive disorder, not elsewhere classified; Esophageal reflux; Essential hypertension, benign; Hypertr obst cardiomyop; Other and unspecified hyperlipidemia; and Unspecified transient cerebral ischemia.   She has a past surgical history that includes Cervix surgery; Bladder repair; Cholecystectomy; Tonsillectomy; and Tubal ligation.   Her family history includes Breast cancer in her mother; Depression in her sister; Stomach cancer in her sister; Uterine cancer in her sister.She reports that she has quit smoking. She has never used smokeless tobacco. She reports that she does not drink alcohol or use drugs.    ROS Review of Systems  Constitutional: Negative for appetite change, chills, diaphoresis, fatigue and fever.  HENT: Positive for sore throat. Negative for congestion, ear pain, hearing loss, postnasal drip, rhinorrhea and trouble swallowing.   Respiratory: Negative for cough, chest tightness and shortness of breath.   Cardiovascular: Negative for chest pain and palpitations.  Gastrointestinal: Negative for abdominal pain.  Musculoskeletal: Negative for arthralgias.  Skin: Negative for rash.    Objective:  BP 125/67   Pulse 79   Temp 97.2 F (36.2 C) (Oral)   Ht 5\' 4"  (1.626 m)   Wt 135 lb (61.2 kg)   BMI 23.17 kg/m   BP Readings from Last 3 Encounters:  07/13/16 125/67  05/31/16 126/80  04/12/16 126/77    Wt Readings from Last 3 Encounters:  07/13/16  135 lb (61.2 kg)  05/31/16 132 lb 9.6 oz (60.1 kg)  04/12/16 135 lb 6.4 oz (61.4 kg)     Physical Exam  Constitutional: She appears well-developed and well-nourished.  HENT:  Head: Normocephalic and atraumatic.  Right Ear: Tympanic membrane and external ear normal. No decreased hearing is noted.  Left Ear: Tympanic membrane and external ear normal. No decreased hearing is noted.  Nose: Mucosal edema present. Right sinus exhibits no frontal sinus tenderness. Left sinus exhibits no frontal sinus tenderness.  Mouth/Throat: No oropharyngeal exudate or posterior oropharyngeal erythema.  Neck: No Brudzinski's sign noted.  Pulmonary/Chest: Breath sounds normal. No respiratory distress.  Lymphadenopathy:       Head (right side): No preauricular adenopathy present.       Head (left side): No preauricular adenopathy present.       Right cervical: No superficial cervical adenopathy present.      Left cervical: No superficial cervical adenopathy present.       Mr Brain Wo Contrast  Result Date: 03/31/2010 Clinical Data: Loss of balance this morning.  Dizziness.  MRI HEAD WITHOUT CONTRAST  Technique:  Multiplanar, multiecho pulse sequences of the brain and surrounding structures were obtained according to standard protocol without intravenous contrast.  Comparison: None.  Findings: There is no evidence for acute infarction, intracranial hemorrhage, mass lesion, hydrocephalus, or extra-axial fluid. There is mild age appropriate atrophy.  Minimal chronic microvascular ischemic change can be seen in the periventricular subcortical white matter.  Incidental empty sella.  No tonsillar abnormality. Minimal chronic sinus disease.  No orbital or mastoid  findings. Major intracranial vascular structures widely patent based on flow void. Minimal pannus.  IMPRESSION: Mild atrophy and small vessel disease.  No acute intracranial findings.  No evidence for post recirculation stroke. Provider: Leia Williams   Assessment & Plan:   [name] Williams was seen today for sore throat.  Diagnoses and all orders for this visit:  Acute pharyngitis, unspecified etiology -     Rapid strep screen (not at [facility])  Other orders -     Culture, Group A Strep      I have discontinued [name] Williams's citalopram. I am also having her maintain her aspirin, Multiple Vitamin, ENSURE, NON FORMULARY, DULoxetine, mirtazapine, pravastatin, and omeprazole.  No orders of the defined types were placed in this encounter.    Follow-up: Return if symptoms worsen or fail to improve.  [name] Williams, M.D.

## 2016-09-28 ENCOUNTER — Ambulatory Visit (INDEPENDENT_AMBULATORY_CARE_PROVIDER_SITE_OTHER): Payer: 59 | Admitting: Psychiatry

## 2016-09-28 ENCOUNTER — Encounter (HOSPITAL_COMMUNITY): Payer: Self-pay | Admitting: Psychiatry

## 2016-09-28 VITALS — BP 130/82 | Ht 64.0 in | Wt 133.0 lb

## 2016-09-28 DIAGNOSIS — Z803 Family history of malignant neoplasm of breast: Secondary | ICD-10-CM

## 2016-09-28 DIAGNOSIS — Z800 Family history of malignant neoplasm of digestive organs: Secondary | ICD-10-CM

## 2016-09-28 DIAGNOSIS — F411 Generalized anxiety disorder: Secondary | ICD-10-CM | POA: Diagnosis not present

## 2016-09-28 DIAGNOSIS — F321 Major depressive disorder, single episode, moderate: Secondary | ICD-10-CM

## 2016-09-28 DIAGNOSIS — Z79899 Other long term (current) drug therapy: Secondary | ICD-10-CM

## 2016-09-28 DIAGNOSIS — Z808 Family history of malignant neoplasm of other organs or systems: Secondary | ICD-10-CM

## 2016-09-28 MED ORDER — MIRTAZAPINE 15 MG PO TABS: 15.0000 mg | ORAL_TABLET | Freq: Every day | ORAL | 2 refills | Status: DC

## 2016-09-28 MED ORDER — DULOXETINE HCL 30 MG PO CPEP: 30.0000 mg | ORAL_CAPSULE | Freq: Every day | ORAL | 3 refills | Status: DC

## 2016-09-28 NOTE — Progress Notes (Signed)
Patient ID: [name] Williams, female   DOB: 12-29-1927, 83 y.o.   MRN: [id number] Patient ID: [name] Williams, female   DOB: February 28, 1928, 83 y.o.   MRN: [id number] Patient ID: [name] Williams, female   DOB: 12-30-1927, 83 y.o.   MRN: [id number] Patient ID: [name] Williams, female   DOB: [birth year], 83 y.o.   MRN: [id number] Patient ID: [name] Williams, female   DOB: 1928/01/07, 83 y.o.   MRN: [id number]  Psychiatric Assessment Adult  Patient Identification:  [name] Williams Date of Evaluation:  09/28/2016 Chief Complaint: "I'm doing great History of Chief Complaint:   Chief Complaint  Patient presents with  . Depression  . Anxiety  . Follow-up    Depression         Associated symptoms include appetite change.  Past medical history includes anxiety.   Anxiety  Symptoms include nausea and nervous/anxious behavior.     this patient is an 83 year old widowed white female who lives with her son in [place]. She has one other son and a daughter as well as 5 grandchildren. 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 She is self-referred but goes to [facility] for her medical care.  The patient states that she's been in good health most of her life and has worked very hard both the tobacco fields and [employer]. 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 She states that her daughter-in-law started working there in a paid position. The patient's husband died in 11/27/2009 after lengthy illness. The patient decided after this to take [employer] the funds she had put in her son and daughter-in-law's name back into her own name. This is when the problem started. She thinks her daughter-in-law became angry about this and started to retaliate against her. She claims the daughter-in-law made up stories about her being out of control and agitated and rude to patrons of the nutrition site. She finally got a letter in November 2014 from the Department of aging  telling her that she was no longer allowed to volunteer there.  She states that this "devastated me" all of her friends attended the site and she had been there 20 years without incident. She was unable to sleep or eat. She lost more than 30 pounds. This went on for about a year and she eventually went to [place] last fall and was admitted for a couple of days. The doctor there put her on mirtazapine 15 mg daily at bedtime which she claims "saved my life" because she started to eat and sleep again. She is also on Wellbutrin which he thinks has helped. For a while she took Ativan and Xanax but no longer takes these.  The patient states that she decided to come to behavioral health because the problem "still eats away at me." She still doesn't talk to her daughter-in-law and his strained relationship with her's son who is married to her. She still has some trouble sleeping and only sleeps 5-6 hours a night. Is still difficult for her to eat much and she drinks an sure when she can. She has not regained much weight. She is somewhat depressed but keeps on going to her exercise classes and is very active but she claims she has to "make myself" she denies having panic attacks but is anxious. She denies suicidal ideation or any psychotic symptoms and is very bright and alert and  her memory is good. Her energy is lower than it used to be. She does have a boyfriend and they spend time together each day. She is also sad because many of her friends have died or gone into nursing homes  The patient returns after 6 months. She's doing very well on the combination of Cymbalta and mirtazapine. She is eating well and sleeping well.  She thinks her vision is a little bit blurred from the mirtazapine and I offered to cut it down and she's in agreement. Her weight is been very stable and she's still doing things in the community and taking care of her son. She denies any symptoms of depression or anxiety and she is  bright and alert. Her boyfriend has declined with dementia but she still sees him every day Review of Systems  Constitutional: Positive for activity change, appetite change and unexpected weight change.  HENT: Negative.   Respiratory: Negative.   Cardiovascular: Negative.   Gastrointestinal: Positive for diarrhea and nausea.  Endocrine: Negative.   Genitourinary: Negative.   Musculoskeletal: Negative.   Skin: Negative.   Allergic/Immunologic: Negative.   Neurological: Negative.   Hematological: Negative.   Psychiatric/Behavioral: Positive for depression, dysphoric mood and sleep disturbance. The patient is nervous/anxious.    Physical Exam not done  Depressive Symptoms: depressed mood, anhedonia, insomnia, psychomotor retardation, anxiety, loss of energy/fatigue,  (Hypo) Manic Symptoms:   Elevated Mood:  No Irritable Mood:  No Grandiosity:  No Distractibility:  No Labiality of Mood:  No Delusions:  No Hallucinations:  No Impulsivity:  No Sexually Inappropriate Behavior:  No Financial Extravagance:  No Flight of Ideas:  No  Anxiety Symptoms: Excessive Worry:  Yes Panic Symptoms:  No Agoraphobia:  No Obsessive Compulsive: No  Symptoms: None, Specific Phobias:  No Social Anxiety:  No  Psychotic Symptoms:  Hallucinations: No None Delusions:  No Paranoia:  No   Ideas of Reference:  No  PTSD Symptoms: Ever had a traumatic exposure:  No Had a traumatic exposure in the last month:  No Re-experiencing: No None Hypervigilance:  No Hyperarousal: No None Avoidance: No None  Traumatic Brain Injury: No  Past Psychiatric History: Diagnosis: Depression and anxiety   Hospitalizations: none  Outpatient Care: none except medication management by primary care   Substance Abuse Care: none  Self-Mutilation: none  Suicidal Attempts:none  Violent Behaviors: none   Past Medical History:   Past Medical History:  Diagnosis Date  . Anxiety   . Depressive disorder, not  elsewhere classified   . Esophageal reflux   . Essential hypertension, benign   . Hypertr obst cardiomyop   . Other and unspecified hyperlipidemia   . Unspecified transient cerebral ischemia    History of Loss of Consciousness:  No Seizure History:  No Cardiac History:  No Allergies:   Allergies  Allergen Reactions  . Latex     Per pt 11-10-14 she do not remember being allergic to this.../or   Current Medications:  Current Outpatient Prescriptions  Medication Sig Dispense Refill  . aspirin 81 MG EC tablet Take 81 mg by mouth every other day.     . DULoxetine (CYMBALTA) 30 MG capsule Take 1 capsule (30 mg total) by mouth daily. 90 capsule 3  . ENSURE (ENSURE) Take 237 mLs by mouth.    . mirtazapine (REMERON) 15 MG tablet Take 1 tablet (15 mg total) by mouth at bedtime. 90 tablet 2  . Multiple Vitamin tablet Take by mouth daily.    . NON FORMULARY  Resetigen-D Taking 500 mg Once a Day    . omeprazole (PRILOSEC) 20 MG capsule TAKE 1 CAPSULE BY MOUTH  DAILY 90 capsule 0  . pravastatin (PRAVACHOL) 20 MG tablet Take 1 tablet (20 mg total) by mouth daily. 90 tablet 3   No current facility-administered medications for this visit.     Previous Psychotropic Medications:  Medication Dose   Wellbutrin XL, Remeron, Xanax                        Substance Abuse History in the last 12 months: Substance Age of 1st Use Last Use Amount Specific Type  Nicotine      Alcohol      Cannabis      Opiates      Cocaine      Methamphetamines      LSD      Ecstasy      Benzodiazepines      Caffeine      Inhalants      Others:                          Medical Consequences of Substance Abuse: none  Legal Consequences of Substance Abuse: none  Family Consequences of Substance Abuse: none  Blackouts:  No DT's:  No Withdrawal Symptoms:  No None  Social History: Current Place of Residence: [place] of Birth: [place] Family Members: 6 living  siblings, 3 children, 5 grandchildren Marital Status:  Widowed Children:   Sons: 2  Daughters: 1 Relationships: Has a steady boyfriend Education:  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 Legal History: none Hobbies/Interests: Exercise class church friends  Family History:   Family History  Problem Relation Age of Onset  . Breast cancer Mother   . Stomach cancer Sister   . Uterine cancer Sister   . Depression Sister     Mental Status Examination/Evaluation: Objective:  Appearance: Casual, Neat and Well Groomed  Eye Contact::  Good  Speech:  Clear and Coherent  Volume: Normal   Mood: Good   Affect:  Bright  Thought Process:  Circumstantial and Goal Directed  Orientation:  Full (Time, Place, and Person)  Thought Content:  Rumination  Suicidal Thoughts:  No  Homicidal Thoughts:  No  Judgement:  Good  Insight:  Fair  Psychomotor Activity:  Normal  Akathisia:  No  Handed:  Right  AIMS (if indicated):    Assets:  Communication Skills Desire for Improvement Physical Health Resilience Social Support    Laboratory/X-Ray Psychological Evaluation(s)   Recent labs are all within normal limits      Assessment:  Axis I: Generalized Anxiety Disorder and Major Depression, single episode  AXIS I Generalized Anxiety Disorder and Major Depression, single episode  AXIS II Deferred  AXIS III Past Medical History:  Diagnosis Date  . Anxiety   . Depressive disorder, not elsewhere classified   . Esophageal reflux   . Essential hypertension, benign   . Hypertr obst cardiomyop   . Other and unspecified hyperlipidemia   . Unspecified transient cerebral ischemia      AXIS IV other psychosocial or environmental problems  AXIS V 51-60 moderate symptoms   Treatment  Plan/Recommendations:  Plan of Care: Medication management   Laboratory:   Psychotherapy: She  will be assigned a counselor here   Medications: . She'll continue mirtazapine But decrease the dose to 15 mg .at bedtime to help with anxiety sleep and appetite. She will continue Cymbalta 30 mg daily for depression  Routine PRN Medications:  No  Consultations:   Safety Concerns:  She denies thoughts of harm to self or others   Other: She'll return in 6 months     Levonne Spiller, MD 2/9/201810:31 AM

## 2016-10-15 ENCOUNTER — Ambulatory Visit (INDEPENDENT_AMBULATORY_CARE_PROVIDER_SITE_OTHER): Payer: Medicare Other | Admitting: Family

## 2016-10-15 ENCOUNTER — Encounter: Payer: Self-pay | Admitting: Family

## 2016-10-15 VITALS — BP 138/74 | HR 68 | Temp 98.6°F | Ht 64.0 in | Wt 137.0 lb

## 2016-10-15 DIAGNOSIS — I10 Essential (primary) hypertension: Secondary | ICD-10-CM | POA: Diagnosis not present

## 2016-10-15 DIAGNOSIS — F329 Major depressive disorder, single episode, unspecified: Secondary | ICD-10-CM

## 2016-10-15 DIAGNOSIS — F32A Depression, unspecified: Secondary | ICD-10-CM

## 2016-10-15 DIAGNOSIS — G4700 Insomnia, unspecified: Secondary | ICD-10-CM

## 2016-10-15 DIAGNOSIS — M810 Age-related osteoporosis without current pathological fracture: Secondary | ICD-10-CM | POA: Diagnosis not present

## 2016-10-15 DIAGNOSIS — K219 Gastro-esophageal reflux disease without esophagitis: Secondary | ICD-10-CM

## 2016-10-15 DIAGNOSIS — E785 Hyperlipidemia, unspecified: Secondary | ICD-10-CM | POA: Diagnosis not present

## 2016-10-15 DIAGNOSIS — F419 Anxiety disorder, unspecified: Secondary | ICD-10-CM | POA: Diagnosis not present

## 2016-10-15 DIAGNOSIS — F411 Generalized anxiety disorder: Secondary | ICD-10-CM | POA: Diagnosis not present

## 2016-10-15 NOTE — Progress Notes (Signed)
Subjective:    Patient ID: [name] Williams, female    DOB: June 14, 1928, 83 y.o.   MRN: [id number]  Pt presents to the office today for chronic follow up. Pt reports "feeling good". PT states she continues to see her councilor/behavior health  every 6 months GAD, Depression, and Insomnia.   Hyperlipidemia  This is a chronic problem. The current episode started more than 1 year ago. The problem is controlled. Recent lipid tests were reviewed and are normal. She has no history of diabetes or hypothyroidism. Pertinent negatives include no leg pain or shortness of breath. Current antihyperlipidemic treatment includes statins. The current treatment provides moderate improvement of lipids. Risk factors for coronary artery disease include dyslipidemia and post-menopausal.  Hypertension  This is a chronic problem. The current episode started more than 1 year ago. The problem has been resolved since onset. The problem is controlled. Associated symptoms include anxiety and peripheral edema ("my ankles some times"). Pertinent negatives include no headaches, palpitations or shortness of breath. Risk factors for coronary artery disease include dyslipidemia, post-menopausal state and sedentary lifestyle. Past treatments include nothing. The current treatment provides mild improvement. Hypertensive end-organ damage includes CVA. There is no history of kidney disease, CAD/MI or heart failure. There is no history of sleep apnea or a thyroid problem.  Depression       The patient presents with depression.  This is a chronic problem.  The current episode started more than 1 year ago.   The onset quality is gradual.   The problem occurs rarely.  The problem has been waxing and waning since onset.  Associated symptoms include insomnia.  Associated symptoms include no helplessness, not irritable, no restlessness, no headaches and not sad.  Past treatments include SSRIs - Selective serotonin reuptake inhibitors.  Compliance with  treatment is good.  Past medical history includes anxiety and depression.     Pertinent negatives include no hypothyroidism and no thyroid problem. Anxiety  Presents for follow-up visit. Onset was 1 to 6 months ago. The problem has been waxing and waning. Symptoms include excessive worry, insomnia and nervous/anxious behavior. Patient reports no depressed mood, irritability, palpitations, panic, restlessness or shortness of breath. Symptoms occur rarely. The symptoms are aggravated by family issues.   Her past medical history is significant for anxiety/panic attacks and depression. Past treatments include SSRIs.  Gastroesophageal Reflux  She reports no belching, no heartburn or no sore throat. This is a chronic problem. The current episode started more than 1 year ago. The problem occurs frequently. The problem has been waxing and waning. The heartburn does not wake her from sleep. The symptoms are aggravated by certain foods and lying down. She has tried a PPI for the symptoms. The treatment provided significant relief.  Insomnia  Primary symptoms: no difficulty falling asleep.  The current episode started more than one year. The onset quality is gradual. The problem occurs rarely. The problem has been resolved since onset. PMH includes: depression.  Osteoporosis PT's last Bone Density was 01/07/15. Pt does weight bearing exercise 3 times a week.    Review of Systems  Constitutional: Negative for irritability.  HENT: Negative.  Negative for sore throat.   Eyes: Negative.   Respiratory: Negative.  Negative for shortness of breath.   Cardiovascular: Negative.  Negative for palpitations.  Gastrointestinal: Negative.  Negative for heartburn.  Endocrine: Negative.   Genitourinary: Negative.   Musculoskeletal: Negative.   Neurological: Negative.  Negative for headaches.  Hematological: Negative.   Psychiatric/Behavioral: Positive  for depression. The patient is nervous/anxious and has insomnia.    All other systems reviewed and are negative.      Objective:   Physical Exam  Constitutional: She is oriented to person, place, and time. She appears well-developed and well-nourished. She is not irritable. No distress.  HENT:  Head: Normocephalic and atraumatic.  Right Ear: External ear normal.  Mouth/Throat: Oropharynx is clear and moist.  Eyes: Pupils are equal, round, and reactive to light.  Neck: Normal range of motion. Neck supple. No thyromegaly present.  Cardiovascular: Normal rate, regular rhythm, normal heart sounds and intact distal pulses.   No murmur heard. Pulmonary/Chest: Effort normal and breath sounds normal. No respiratory distress. She has no wheezes.  Abdominal: Soft. Bowel sounds are normal. She exhibits no distension. There is no tenderness.  Musculoskeletal: Normal range of motion. She exhibits edema (trace in BLE ankles ). She exhibits no tenderness.  Neurological: She is alert and oriented to person, place, and time. She has normal reflexes. No cranial nerve deficit.  Skin: Skin is warm and dry.  Psychiatric: She has a normal mood and affect. Her behavior is normal. Judgment and thought content normal.  Vitals reviewed.   BP 138/74   Pulse 68   Temp 98.6 F (37 C) (Oral)   Ht _0  (1.626 m)   Wt 137 lb (62.1 kg)   BMI 23.52 kg/m      Assessment & Plan:  1. HYPERTENSION, BENIGN - CMP14+EGFR  2. Gastroesophageal reflux disease, esophagitis presence not specified - CMP14+EGFR  3. Osteoporosis, unspecified osteoporosis type, unspecified pathological fracture presence - CMP14+EGFR  4. Anxiety - CMP14+EGFR  5. Depression, unspecified depression type - CMP14+EGFR  6. Insomnia, unspecified type  - CMP14+EGFR  7. Hyperlipidemia, unspecified hyperlipidemia type - CMP14+EGFR - Lipid panel  8. GAD (generalized anxiety disorder) - CMP14+EGFR   Continue all meds Labs pending Health Maintenance reviewed Diet and exercise encouraged RTO 6  months   Evelina Dun, FNP

## 2016-10-15 NOTE — Patient Instructions (Signed)

## 2016-10-16 LAB — CMP14+EGFR
ALT: 13 IU/L (ref 0–32)
AST: 19 IU/L (ref 0–40)
Albumin/Globulin Ratio: 1.5 (ref 1.2–2.2)
Albumin: 3.9 g/dL (ref 3.5–4.7)
Alkaline Phosphatase: 95 IU/L (ref 39–117)
BUN/Creatinine Ratio: 24 (ref 12–28)
BUN: 28 mg/dL — ABNORMAL HIGH (ref 8–27)
Bilirubin Total: 0.4 mg/dL (ref 0.0–1.2)
CO2: 25 mmol/L (ref 18–29)
Calcium: 9 mg/dL (ref 8.7–10.3)
Chloride: 102 mmol/L (ref 96–106)
Creatinine, Ser: 1.16 mg/dL — ABNORMAL HIGH (ref 0.57–1.00)
GFR calc Af Amer: 49 mL/min/1.73 — ABNORMAL LOW
GFR calc non Af Amer: 43 mL/min/1.73 — ABNORMAL LOW
Globulin, Total: 2.6 g/dL (ref 1.5–4.5)
Glucose: 98 mg/dL (ref 65–99)
Potassium: 4.8 mmol/L (ref 3.5–5.2)
Sodium: 144 mmol/L (ref 134–144)
Total Protein: 6.5 g/dL (ref 6.0–8.5)

## 2016-10-16 LAB — LIPID PANEL
Chol/HDL Ratio: 6 ratio — ABNORMAL HIGH (ref 0.0–4.4)
Cholesterol, Total: 204 mg/dL — ABNORMAL HIGH (ref 100–199)
HDL: 34 mg/dL — ABNORMAL LOW
LDL Calculated: 107 mg/dL — ABNORMAL HIGH (ref 0–99)
Triglycerides: 317 mg/dL — ABNORMAL HIGH (ref 0–149)
VLDL Cholesterol Cal: 63 mg/dL — ABNORMAL HIGH (ref 5–40)

## 2016-10-31 ENCOUNTER — Encounter: Payer: Self-pay | Admitting: Neurology

## 2016-11-27 ENCOUNTER — Other Ambulatory Visit: Payer: Self-pay | Admitting: Family

## 2016-11-27 DIAGNOSIS — E785 Hyperlipidemia, unspecified: Secondary | ICD-10-CM

## 2017-01-08 ENCOUNTER — Other Ambulatory Visit: Payer: Self-pay | Admitting: Family

## 2017-01-08 DIAGNOSIS — K219 Gastro-esophageal reflux disease without esophagitis: Secondary | ICD-10-CM

## 2017-01-24 ENCOUNTER — Telehealth (HOSPITAL_COMMUNITY): Payer: Self-pay | Admitting: *Deleted

## 2017-01-24 NOTE — Telephone Encounter (Signed)
phone call from patient, she said she has a son who has been living in her basement.  He has been on disability for years, Bipolar.   He is at [facility] and they are getting ready to release him back to her home and she said her nerves is so tore up, she is afraid to have him come back into her home.   She said her basement is so filthy from where he has been staying.   She does not want him to come back into her home.  She said she has had strokes and she don't want to have another stroke.   She would like Dr. Harrington Challenger to call her please.   She don't know what to do.

## 2017-01-24 NOTE — Telephone Encounter (Signed)
[email or web address]

## 2017-01-24 NOTE — Telephone Encounter (Signed)
It would be best if she could come in

## 2017-01-28 ENCOUNTER — Encounter (HOSPITAL_COMMUNITY): Payer: Self-pay | Admitting: Psychiatry

## 2017-01-28 ENCOUNTER — Ambulatory Visit (INDEPENDENT_AMBULATORY_CARE_PROVIDER_SITE_OTHER): Payer: 59 | Admitting: Psychiatry

## 2017-01-28 VITALS — BP 130/70 | HR 64 | Ht 64.0 in | Wt 135.8 lb

## 2017-01-28 DIAGNOSIS — F411 Generalized anxiety disorder: Secondary | ICD-10-CM | POA: Diagnosis not present

## 2017-01-28 DIAGNOSIS — Z79899 Other long term (current) drug therapy: Secondary | ICD-10-CM

## 2017-01-28 DIAGNOSIS — F329 Major depressive disorder, single episode, unspecified: Secondary | ICD-10-CM

## 2017-01-28 DIAGNOSIS — Z7982 Long term (current) use of aspirin: Secondary | ICD-10-CM

## 2017-01-28 DIAGNOSIS — F321 Major depressive disorder, single episode, moderate: Secondary | ICD-10-CM

## 2017-01-28 MED ORDER — MIRTAZAPINE 15 MG PO TABS: 15.0000 mg | ORAL_TABLET | Freq: Every day | ORAL | 2 refills | Status: DC

## 2017-01-28 MED ORDER — DULOXETINE HCL 60 MG PO CPEP: 60.0000 mg | ORAL_CAPSULE | Freq: Every day | ORAL | 2 refills | Status: DC

## 2017-01-28 NOTE — Progress Notes (Signed)
Patient ID: [name] Williams, female   DOB: 1928/03/13, 83 y.o.   MRN: [id number] Patient ID: [name] Williams, female   DOB: 1928/07/03, 83 y.o.   MRN: [id number] Patient ID: [name] Williams, female   DOB: 28-Nov-1927, 83 y.o.   MRN: [id number] Patient ID: [name] Williams, female   DOB: 10-Jun-1928, 83 y.o.   MRN: [id number] Patient ID: [name] Williams, female   DOB: 1928-04-10, 83 y.o.   MRN: [id number]  Psychiatric Assessment Adult  Patient Identification:  [name] Williams Date of Evaluation:  01/28/2017 Chief Complaint: "I'm doing great History of Chief Complaint:   Chief Complaint  Patient presents with  . Follow-up    Want to talk about her Cymbalta  . Anxiety  . Depression    Depression         Associated symptoms include appetite change.  Past medical history includes anxiety.   Anxiety  Symptoms include nausea and nervous/anxious behavior.     this patient is an 83 year old widowed white female who lives with her son in [place]. She has one other son and a daughter as well as 5 grandchildren. 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 She is self-referred but goes to [facility] for her medical care.  The patient states that she's been in good health most of her life and has worked very hard both the tobacco fields and [employer]. 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 She states that her daughter-in-law started working there in a paid position. The patient's husband died in 2009/12/02 after lengthy illness. The patient decided after this to take Paxil the funds she had put in her son and daughter-in-law's name back into her own name. This is when the problem started. She thinks her daughter-in-law became angry about this and started to retaliate against her. She claims the daughter-in-law made up stories about her being out of control and agitated and rude to patrons of the nutrition site. She finally got a letter in November  2014 from the Department of aging telling her that she was no longer allowed to volunteer there.  She states that this "devastated me" all of her friends attended the site and she had been there 20 years without incident. She was unable to sleep or eat. She lost more than 30 pounds. This went on for about a year and she eventually went to [facility] last fall and was admitted for a couple of days. The doctor there put her on mirtazapine 15 mg daily at bedtime which she claims "saved my life" because she started to eat and sleep again. She is also on Wellbutrin which he thinks has helped. For a while she took Ativan and Xanax but no longer takes these.  The patient states that she decided to come to behavioral health because the problem "still eats away at me." She still doesn't talk to her daughter-in-law and his strained relationship with her's son who is married to her. She still has some trouble sleeping and only sleeps 5-6 hours a night. Is still difficult for her to eat much and she drinks an sure when she can. She has not regained much weight. She is somewhat depressed but keeps on going to her exercise classes and is very active but she claims she has to "make myself" she denies having panic attacks but is anxious. She denies suicidal ideation or any  psychotic symptoms and is very bright and alert and her memory is good. Her energy is lower than it used to be. She does have a boyfriend and they spend time together each day. She is also sad because many of her friends have died or gone into nursing homes  The patient returns after 3 months. She is very distressed lately because her 7 year old son was in the hospital. He lives in her basement and he is a significant alcoholic. He drinks about a case of beer a day. He has lost control of his bowel and bladder and can barely walk. He lives in a filthy mess. The hospital discharged him before she could get him placed outside the home and she is very  upset about it. She is going to try to get healthcare power of attorney so she can move him into an assisted living facility. She is more distraught and depressed. I suggested we increase the Cymbalta and she agrees. She is trying to stay busy and eat well and sleep well. Review of Systems  Constitutional: Positive for activity change, appetite change and unexpected weight change.  HENT: Negative.   Respiratory: Negative.   Cardiovascular: Negative.   Gastrointestinal: Positive for diarrhea and nausea.  Endocrine: Negative.   Genitourinary: Negative.   Musculoskeletal: Negative.   Skin: Negative.   Allergic/Immunologic: Negative.   Neurological: Negative.   Hematological: Negative.   Psychiatric/Behavioral: Positive for depression, dysphoric mood and sleep disturbance. The patient is nervous/anxious.    Physical Exam not done  Depressive Symptoms: depressed mood, anhedonia, insomnia, psychomotor retardation, anxiety, loss of energy/fatigue,  (Hypo) Manic Symptoms:   Elevated Mood:  No Irritable Mood:  No Grandiosity:  No Distractibility:  No Labiality of Mood:  No Delusions:  No Hallucinations:  No Impulsivity:  No Sexually Inappropriate Behavior:  No Financial Extravagance:  No Flight of Ideas:  No  Anxiety Symptoms: Excessive Worry:  Yes Panic Symptoms:  No Agoraphobia:  No Obsessive Compulsive: No  Symptoms: None, Specific Phobias:  No Social Anxiety:  No  Psychotic Symptoms:  Hallucinations: No None Delusions:  No Paranoia:  No   Ideas of Reference:  No  PTSD Symptoms: Ever had a traumatic exposure:  No Had a traumatic exposure in the last month:  No Re-experiencing: No None Hypervigilance:  No Hyperarousal: No None Avoidance: No None  Traumatic Brain Injury: No  Past Psychiatric History: Diagnosis: Depression and anxiety   Hospitalizations: none  Outpatient Care: none except medication management by primary care   Substance Abuse Care: none   Self-Mutilation: none  Suicidal Attempts:none  Violent Behaviors: none   Past Medical History:   Past Medical History:  Diagnosis Date  . Anxiety   . Depressive disorder, not elsewhere classified   . Esophageal reflux   . Essential hypertension, benign   . Hypertr obst cardiomyop   . Other and unspecified hyperlipidemia   . Unspecified transient cerebral ischemia    History of Loss of Consciousness:  No Seizure History:  No Cardiac History:  No Allergies:   Allergies  Allergen Reactions  . Latex     Per pt 11-10-14 she do not remember being allergic to this.../or   Current Medications:  Current Outpatient Prescriptions  Medication Sig Dispense Refill  . aspirin 81 MG EC tablet Take 81 mg by mouth every other day.     . ENSURE (ENSURE) Take 237 mLs by mouth.    . mirtazapine (REMERON) 15 MG tablet Take 1 tablet (15 mg total) by  mouth at bedtime. 90 tablet 2  . Multiple Vitamin tablet Take by mouth daily.    . NON FORMULARY Resetigen-D Taking 500 mg Once a Day    . omeprazole (PRILOSEC) 20 MG capsule TAKE 1 CAPSULE BY MOUTH  DAILY 90 capsule 1  . pravastatin (PRAVACHOL) 20 MG tablet Take 1 tablet (20 mg total) by mouth daily. 90 tablet 3  . simvastatin (ZOCOR) 20 MG tablet TAKE 1 TABLET BY MOUTH AT  BEDTIME 90 tablet 1  . DULoxetine (CYMBALTA) 60 MG capsule Take 1 capsule (60 mg total) by mouth daily. 90 capsule 2   No current facility-administered medications for this visit.     Previous Psychotropic Medications:  Medication Dose   Wellbutrin XL, Remeron, Xanax                        Substance Abuse History in the last 12 months: Substance Age of 1st Use Last Use Amount Specific Type  Nicotine      Alcohol      Cannabis      Opiates      Cocaine      Methamphetamines      LSD      Ecstasy      Benzodiazepines      Caffeine      Inhalants      Others:                          Medical Consequences of Substance Abuse: none  Legal Consequences of  Substance Abuse: none  Family Consequences of Substance Abuse: none  Blackouts:  No DT's:  No Withdrawal Symptoms:  No None  Social History: Current Place of Residence: [place] of Birth: [place] Family Members: 6 living siblings, 3 children, 5 grandchildren Marital Status:  Widowed Children:   Sons: 2  Daughters: 1 Relationships: Has a steady boyfriend Education:  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 Legal History: none Hobbies/Interests: Exercise class church friends  Family History:   Family History  Problem Relation Age of Onset  . Breast cancer Mother   . Stomach cancer Sister   . Uterine cancer Sister   . Depression Sister     Mental Status Examination/Evaluation: Objective:  Appearance: Casual, Neat and Well Groomed  Eye Contact::  Good  Speech:  Clear and Coherent  Volume: Normal   Mood: Anxious   Affect:  Worried   Thought Process:  Circumstantial and Goal Directed  Orientation:  Full (Time, Place, and Person)  Thought Content:  Rumination  Suicidal Thoughts:  No  Homicidal Thoughts:  No  Judgement:  Good  Insight:  Fair  Psychomotor Activity:  Normal  Akathisia:  No  Handed:  Right  AIMS (if indicated):    Assets:  Communication Skills Desire for Improvement Physical Health Resilience Social Support    Laboratory/X-Ray Psychological Evaluation(s)   Recent labs are all within normal limits      Assessment:  Axis I: Generalized Anxiety Disorder and Major Depression, single episode  AXIS I Generalized Anxiety Disorder and Major Depression, single episode  AXIS II Deferred  AXIS III Past Medical History:  Diagnosis Date  . Anxiety   . Depressive disorder, not elsewhere classified   .  Esophageal reflux   .  Essential hypertension, benign   . Hypertr obst cardiomyop   . Other and unspecified hyperlipidemia   . Unspecified transient cerebral ischemia      AXIS IV other psychosocial or environmental problems  AXIS V 51-60 moderate symptoms   Treatment Plan/Recommendations:  Plan of Care: Medication management   Laboratory:   Psychotherapy: She will be assigned a counselor here   Medications: . She'll continue mirtazapine 15 mg .at bedtime to help with anxiety sleep and appetite. She will continue Cymbalta But increase the dosage to 60 mg daily for depression  Routine PRN Medications:  No  Consultations:   Safety Concerns:  She denies thoughts of harm to self or others   Other: She'll return in 4 weeks     Levonne Spiller, MD 6/11/20183:50 PM

## 2017-01-28 NOTE — Telephone Encounter (Signed)
Pt came into office for an appt today

## 2017-02-06 ENCOUNTER — Ambulatory Visit: Payer: Medicare Other | Admitting: Neurology

## 2017-02-25 ENCOUNTER — Ambulatory Visit (INDEPENDENT_AMBULATORY_CARE_PROVIDER_SITE_OTHER): Payer: 59 | Admitting: Psychiatry

## 2017-02-25 ENCOUNTER — Encounter (HOSPITAL_COMMUNITY): Payer: Self-pay | Admitting: Psychiatry

## 2017-02-25 VITALS — BP 124/80 | HR 78 | Ht 64.0 in | Wt 137.0 lb

## 2017-02-25 DIAGNOSIS — F411 Generalized anxiety disorder: Secondary | ICD-10-CM

## 2017-02-25 DIAGNOSIS — Z818 Family history of other mental and behavioral disorders: Secondary | ICD-10-CM

## 2017-02-25 DIAGNOSIS — F321 Major depressive disorder, single episode, moderate: Secondary | ICD-10-CM

## 2017-02-25 MED ORDER — MIRTAZAPINE 15 MG PO TABS: 15.0000 mg | ORAL_TABLET | Freq: Every day | ORAL | 2 refills | Status: DC

## 2017-02-25 MED ORDER — DULOXETINE HCL 60 MG PO CPEP: 60.0000 mg | ORAL_CAPSULE | Freq: Every day | ORAL | 2 refills | Status: DC

## 2017-02-25 NOTE — Progress Notes (Signed)
Patient ID: [name] Williams, female   DOB: 30-Oct-1927, 83 y.o.   MRN: [id number] Patient ID: [name] Williams, female   DOB: 08-09-1928, 83 y.o.   MRN: [id number] Patient ID: [name] Williams, female   DOB: [birth year], 83 y.o.   MRN: [id number] Patient ID: [name] Williams, female   DOB: 02-22-1928, 83 y.o.   MRN: [id number] Patient ID: [name] Williams, female   DOB: 1927/11/04, 83 y.o.   MRN: [id number]  Psychiatric Assessment Adult  Patient Identification:  [name] Williams Date of Evaluation:  02/25/2017 Chief Complaint: "I'm doing great History of Chief Complaint:   Chief Complaint  Patient presents with  . Follow-up  . Depression    Anxiety  Symptoms include nausea and nervous/anxious behavior.    Depression         Associated symptoms include appetite change.  Past medical history includes anxiety.    this patient is an 83 year old widowed white female who lives with her son in [place]. She has one other son and a daughter as well as 5 grandchildren. 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 She is self-referred but goes to [facility] for her medical care.  The patient states that she's been in good health most of her life and has worked very hard both the tobacco fields and [employer]. 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 She states that her daughter-in-law started working there in a paid position. The patient's husband died in 11-25-2009 after lengthy illness. The patient decided after this to take Paxil the funds she had put in her son and daughter-in-law's name back into her own name. This is when the problem started. She thinks her daughter-in-law became angry about this and started to retaliate against her. She claims the daughter-in-law made up stories about her being out of control and agitated and rude to patrons of the nutrition site. She finally got a letter in November 2014 from the Department of aging telling her that  she was no longer allowed to volunteer there.  She states that this "devastated me" all of her friends attended the site and she had been there 20 years without incident. She was unable to sleep or eat. She lost more than 30 pounds. This went on for about a year and she eventually went to [facility] last fall and was admitted for a couple of days. The doctor there put her on mirtazapine 15 mg daily at bedtime which she claims "saved my life" because she started to eat and sleep again. She is also on Wellbutrin which he thinks has helped. For a while she took Ativan and Xanax but no longer takes these.  The patient states that she decided to come to behavioral health because the problem "still eats away at me." She still doesn't talk to her daughter-in-law and his strained relationship with her's son who is married to her. She still has some trouble sleeping and only sleeps 5-6 hours a night. Is still difficult for her to eat much and she drinks ensure when she can. She has not regained much weight. She is somewhat depressed but keeps on going to her exercise classes and is very active but she claims she has to "make myself" she denies having panic attacks but is anxious. She denies suicidal ideation or any psychotic symptoms and is very bright and alert and her memory is good.  Her energy is lower than it used to be. She does have a boyfriend and they spend time together each day. She is also sad because many of her friends have died or gone into nursing homes  The patient returns after 4 weeks. Last time I increased her Cymbalta because she was upset and distressed. Her 16 year old son had been in the hospital and was discharged back to home. She was hoping he would be placed in an assisted living or alcohol rehabilitation facility. States that he refuses to clean after himself and won't do anything to help himself. She is talking to a lawyer about getting medical power of attorney and she is going to  sit him down and sit down some rules and boundaries. She is feeling better since we increased the Cymbalta. She is eating okay sleeping well and staying current on her activities with friends. Review of Systems  Constitutional: Positive for activity change, appetite change and unexpected weight change.  HENT: Negative.   Respiratory: Negative.   Cardiovascular: Negative.   Gastrointestinal: Positive for diarrhea and nausea.  Endocrine: Negative.   Genitourinary: Negative.   Musculoskeletal: Negative.   Skin: Negative.   Allergic/Immunologic: Negative.   Neurological: Negative.   Hematological: Negative.   Psychiatric/Behavioral: Positive for depression, dysphoric mood and sleep disturbance. The patient is nervous/anxious.    Physical Exam not done  Depressive Symptoms: depressed mood, anhedonia, insomnia, psychomotor retardation, anxiety, loss of energy/fatigue,  (Hypo) Manic Symptoms:   Elevated Mood:  No Irritable Mood:  No Grandiosity:  No Distractibility:  No Labiality of Mood:  No Delusions:  No Hallucinations:  No Impulsivity:  No Sexually Inappropriate Behavior:  No Financial Extravagance:  No Flight of Ideas:  No  Anxiety Symptoms: Excessive Worry:  Yes Panic Symptoms:  No Agoraphobia:  No Obsessive Compulsive: No  Symptoms: None, Specific Phobias:  No Social Anxiety:  No  Psychotic Symptoms:  Hallucinations: No None Delusions:  No Paranoia:  No   Ideas of Reference:  No  PTSD Symptoms: Ever had a traumatic exposure:  No Had a traumatic exposure in the last month:  No Re-experiencing: No None Hypervigilance:  No Hyperarousal: No None Avoidance: No None  Traumatic Brain Injury: No  Past Psychiatric History: Diagnosis: Depression and anxiety   Hospitalizations: none  Outpatient Care: none except medication management by primary care   Substance Abuse Care: none  Self-Mutilation: none  Suicidal Attempts:none  Violent Behaviors: none   Past  Medical History:   Past Medical History:  Diagnosis Date  . Anxiety   . Depressive disorder, not elsewhere classified   . Esophageal reflux   . Essential hypertension, benign   . Hypertr obst cardiomyop   . Other and unspecified hyperlipidemia   . Unspecified transient cerebral ischemia    History of Loss of Consciousness:  No Seizure History:  No Cardiac History:  No Allergies:   Allergies  Allergen Reactions  . Latex     Per pt 11-10-14 she do not remember being allergic to this.../or   Current Medications:  Current Outpatient Prescriptions  Medication Sig Dispense Refill  . aspirin 81 MG EC tablet Take 81 mg by mouth every other day.     . DULoxetine (CYMBALTA) 60 MG capsule Take 1 capsule (60 mg total) by mouth daily. 90 capsule 2  . ENSURE (ENSURE) Take 237 mLs by mouth.    . mirtazapine (REMERON) 15 MG tablet Take 1 tablet (15 mg total) by mouth at bedtime. 90 tablet 2  .  Multiple Vitamin tablet Take by mouth daily.    . NON FORMULARY Resetigen-D Taking 500 mg Once a Day    . omeprazole (PRILOSEC) 20 MG capsule TAKE 1 CAPSULE BY MOUTH  DAILY 90 capsule 1  . pravastatin (PRAVACHOL) 20 MG tablet Take 1 tablet (20 mg total) by mouth daily. 90 tablet 3  . simvastatin (ZOCOR) 20 MG tablet TAKE 1 TABLET BY MOUTH AT  BEDTIME 90 tablet 1   No current facility-administered medications for this visit.     Previous Psychotropic Medications:  Medication Dose   Wellbutrin XL, Remeron, Xanax                        Substance Abuse History in the last 12 months: Substance Age of 1st Use Last Use Amount Specific Type  Nicotine      Alcohol      Cannabis      Opiates      Cocaine      Methamphetamines      LSD      Ecstasy      Benzodiazepines      Caffeine      Inhalants      Others:                          Medical Consequences of Substance Abuse: none  Legal Consequences of Substance Abuse: none  Family Consequences of Substance Abuse: none  Blackouts:   No DT's:  No Withdrawal Symptoms:  No None  Social History: Current Place of Residence: [place] of Birth: [place] Family Members: 6 living siblings, 3 children, 5 grandchildren Marital Status:  Widowed Children:   Sons: 2  Daughters: 1 Relationships: Has a steady boyfriend Education:  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 Legal History: none Hobbies/Interests: Exercise class church friends  Family History:   Family History  Problem Relation Age of Onset  . Breast cancer Mother   . Stomach cancer Sister   . Uterine cancer Sister   . Depression Sister     Mental Status Examination/Evaluation: Objective:  Appearance: Casual, Neat and Well Groomed  Eye Contact::  Good  Speech:  Clear and Coherent  Volume: Normal   Mood: Fairly good   Affect:  Bright   Thought Process:  Circumstantial and Goal Directed  Orientation:  Full (Time, Place, and Person)  Thought Content:  Rumination  Suicidal Thoughts:  No  Homicidal Thoughts:  No  Judgement:  Good  Insight:  Fair  Psychomotor Activity:  Normal  Akathisia:  No  Handed:  Right  AIMS (if indicated):    Assets:  Communication Skills Desire for Improvement Physical Health Resilience Social Support    Laboratory/X-Ray Psychological Evaluation(s)   Recent labs are all within normal limits      Assessment:  Axis I: Generalized Anxiety Disorder and Major Depression, single episode  AXIS I Generalized Anxiety Disorder and Major Depression, single episode  AXIS II Deferred  AXIS III Past Medical History:  Diagnosis Date  . Anxiety   . Depressive disorder, not elsewhere classified   . Esophageal reflux   . Essential hypertension, benign   . Hypertr obst cardiomyop   .  Other and unspecified hyperlipidemia   . Unspecified transient cerebral ischemia  AXIS IV other psychosocial or environmental problems  AXIS V 51-60 moderate symptoms   Treatment Plan/Recommendations:  Plan of Care: Medication management   Laboratory:   Psychotherapy: She will be assigned a counselor here   Medications: . She'll continue mirtazapine 15 mg .at bedtime to help with anxiety sleep and appetite. She will continue Cymbalta 60 mg daily for depression  Routine PRN Medications:  No  Consultations:   Safety Concerns:  She denies thoughts of harm to self or others   Other: She'll return in 3 months     Levonne Spiller, MD 7/9/20182:43 PM

## 2017-03-02 DIAGNOSIS — S4992XA Unspecified injury of left shoulder and upper arm, initial encounter: Secondary | ICD-10-CM | POA: Diagnosis not present

## 2017-03-02 DIAGNOSIS — M25512 Pain in left shoulder: Secondary | ICD-10-CM | POA: Diagnosis not present

## 2017-03-21 ENCOUNTER — Ambulatory Visit (INDEPENDENT_AMBULATORY_CARE_PROVIDER_SITE_OTHER): Payer: Medicare Other | Admitting: Neurology

## 2017-03-21 ENCOUNTER — Encounter: Payer: Self-pay | Admitting: Neurology

## 2017-03-21 VITALS — BP 118/62 | HR 78 | Wt 135.4 lb

## 2017-03-21 DIAGNOSIS — I6529 Occlusion and stenosis of unspecified carotid artery: Secondary | ICD-10-CM | POA: Diagnosis not present

## 2017-03-21 NOTE — Patient Instructions (Signed)
I had a long discussion with the patient with regards to her remote episodes of possible TIAs and she was really done well and has remained episode free for more than 6 years now. I recommend she stay on aspirin) milligrams daily for stroke prevention and maintain adequate hydration and eat healthy diet and be active. Strict control of hypertension with blood pressure goal below 130/90 and lipids with LDL cholesterol goal below 70 mg percent. Check screening follow-up carotid ultrasound study. No need for routine follow-up appointment with me but she may be referred back in the future by her primary care physician if needed.

## 2017-03-21 NOTE — Progress Notes (Signed)
[facility]  PATIENT: [name] Williams DOB: 1927-11-09   REASON FOR VISIT: Follow-up for TIA,  memory loss HISTORY FROM: Patient    HISTORY OF PRESENT ILLNESS: UPDATE 6/21/17Ms. Balicki, 83 year old white female returns today for yearly followup.  She has a history of brief episodes of unawareness, slurring of speech and left facial droop, possible posterior circulatory TIAs. Her episodes last for a couple of hours with improvement, she has had no episodes in over 6 yrs. She denies any headaches. EEGs have been normal. There is no prior history of stroke or seizure or significant head injury with loss of consciousness. CT also shows mild age related atrophy. MRI of the brain showed no definite stroke. 2-D echo has been unremarkable. Carotid Dopplers have shown only mild stenosis. She returns today doing well, she remains on aspirin . She denies any side effects. Husband died several years ago from cancer. She remains independent in all activities of daily living, continues to drive. She exercises [facility] [employer] [facility]. She goes to a dance class. She lives with her son. No new neurological complaints. She continues to have mild short-term memory difficulties which appear to be unchanged. She tells me that she is seeing a counselor for her anxiety and depression.   Update 03/21/2017 :  She returns for follow-up after last visit year ago. She continues to do well and has not had any episodes of TIA or seizures for more than 7 years now. She is more bothered today about her left shoulder pain which she is seeing a medical Dr. She remains on aspirin which is tolerating well without bruising or bleeding. He has not had any new neurological complaints. Her blood pressure well controlled today is 118/62. She has not had follow-up carotid ultrasound done for several years.  REVIEW OF SYSTEMS: Full 14 system review of systems performed and notable only for those listed, all  others are neg:   No complaints except left shoulder pain and all other systems negative  ALLERGIES: Allergies  Allergen Reactions  . Latex     Per pt 11-10-14 she do not remember being allergic to this.../or    HOME MEDICATIONS: Outpatient Medications Prior to Visit  Medication Sig Dispense Refill  . aspirin 81 MG EC tablet Take 81 mg by mouth every other day.     . DULoxetine (CYMBALTA) 60 MG capsule Take 1 capsule (60 mg total) by mouth daily. 90 capsule 2  . ENSURE (ENSURE) Take 237 mLs by mouth.    . mirtazapine (REMERON) 15 MG tablet Take 1 tablet (15 mg total) by mouth at bedtime. 90 tablet 2  . Multiple Vitamin tablet Take by mouth daily.    . NON FORMULARY Resetigen-D Taking 500 mg Once a Day    . omeprazole (PRILOSEC) 20 MG capsule TAKE 1 CAPSULE BY MOUTH  DAILY 90 capsule 1  . pravastatin (PRAVACHOL) 20 MG tablet Take 1 tablet (20 mg total) by mouth daily. (Patient not taking: Reported on 03/21/2017) 90 tablet 3  . simvastatin (ZOCOR) 20 MG tablet TAKE 1 TABLET BY MOUTH AT  BEDTIME (Patient not taking: Reported on 03/21/2017) 90 tablet 1   No facility-administered medications prior to visit.     PAST MEDICAL HISTORY: Past Medical History:  Diagnosis Date  . Anxiety   . Depressive disorder, not elsewhere classified   . Esophageal reflux   . Essential hypertension, benign   . Hypertr obst cardiomyop   . Other and unspecified  hyperlipidemia   . Stroke ([place])   . Unspecified transient cerebral ischemia     PAST SURGICAL HISTORY: Past Surgical History:  Procedure Laterality Date  . BLADDER REPAIR    . CERVIX SURGERY    . CHOLECYSTECTOMY    . TONSILLECTOMY    . TUBAL LIGATION      FAMILY HISTORY: Family History  Problem Relation Age of Onset  . Breast cancer Mother   . Stomach cancer Sister   . Uterine cancer Sister   . Depression Sister     SOCIAL HISTORY: Social History   Social History  . Marital status: Married    Spouse name: N/A  . Number of  children: 3  . Years of education: N/A   Occupational History  . Not on file.   Social History Main Topics  . Smoking status: Former Research scientist (life sciences)  . Smokeless tobacco: Never Used     Comment: quit 2012  . Alcohol use No  . Drug use: No  . Sexual activity: Not Currently   Other Topics Concern  . Not on file   Social History Narrative   Patient is retired.    Patient is widowed.    Patient has 3 children.    Patient is right handed.    Pt lives with son.       PHYSICAL EXAM  Vitals:   03/21/17 1328  BP: 118/62  Pulse: 78  Weight: 135 lb 6.4 oz (61.4 kg)   Body mass index is 23.24 kg/m. Generalized: Well developed, in no acute distress  Head: normocephalic and atraumatic,. Oropharynx benign  Neck: Supple, no carotid bruits  Cardiac: Regular rate rhythm, no murmur  Musculoskeletal: No deformity   Neurological examination   Mentation: Alert oriented to time, place, history taking. MMSE not done today Follows all commands speech and language fluent  Cranial nerve II-XII: .Pupils were equal round reactive to light extraocular movements were full, visual field were full on confrontational test. Facial sensation and strength were normal. hearing was intact to finger rubbing bilaterally. Uvula tongue midline. head turning and shoulder shrug were normal and symmetric.Tongue protrusion into cheek strength was normal. Motor: normal bulk and tone, full strength in the BUE, BLE, fine finger movements normal, no pronator drift. No focal weakness Sensory: normal and symmetric to light touch, pinprick, and vibration  Coordination: finger-nose-finger, heel-to-shin bilaterally, no dysmetria Reflexes: 1+ upper lower and symmetric, plantar responses were flexor bilaterally. Gait and Station: Rising up from seated position without assistance, normal stance, moderate stride, good arm swing, smooth turning, able to perform tiptoe, and heel walking without difficulty. Tandem gait is steady,  no assistive device DIAGNOSTIC DATA (LABS, IMAGING, TESTING) - I reviewed patient records, labs, notes, testing and imaging myself where available.      Component Value Date/Time   NA 144 10/15/2016 1453   K 4.8 10/15/2016 1453   CL 102 10/15/2016 1453   CO2 25 10/15/2016 1453   GLUCOSE 98 10/15/2016 1453   GLUCOSE 107 (H) 03/31/2010 0845   BUN 28 (H) 10/15/2016 1453   CREATININE 1.16 (H) 10/15/2016 1453   CALCIUM 9.0 10/15/2016 1453   PROT 6.5 10/15/2016 1453   ALBUMIN 3.9 10/15/2016 1453   AST 19 10/15/2016 1453   ALT 13 10/15/2016 1453   ALKPHOS 95 10/15/2016 1453   BILITOT 0.4 10/15/2016 1453   GFRNONAA 43 (L) 10/15/2016 1453   GFRAA 49 (L) 10/15/2016 1453   Lab Results  Component Value Date   CHOL 204 (H) 10/15/2016  HDL 34 (L) 10/15/2016   LDLCALC 107 (H) 10/15/2016   TRIG 317 (H) 10/15/2016   CHOLHDL 6.0 (H) 10/15/2016        ASSESSMENT AND PLAN 83 y.o. year old female has a past medical history of episodes of altered awareness with slurred speech and left facial droop, these were felt to represent posterior circulatory TIAs. No episodes in 6 years. Carotid Doppler  negative for significant stenosis. The patient is a current patient of Dr. Leonie Man who is out of the office today . This note is sent to the work in doctor.   I had a long discussion with the patient with regards to her remote episodes of possible TIAs and she was really done well and has remained episode free for more than 6 years now. I recommend she stay on aspirin) milligrams daily for stroke prevention and maintain adequate hydration and eat healthy diet and be active. Strict control of hypertension with blood pressure goal below 130/90 and lipids with LDL cholesterol goal below 70 mg percent. Check screening follow-up carotid ultrasound study. Greater than 50% time during this 25 minute visit was spent on counseling and coordination of care about her remote TIAs discussion about stroke prevention  and answering questions. No need for routine follow-up appointment with me but she may be referred back in the future by her primary care physician if needed.  Antony Contras, MD [facility] [address], [place], [place] [address] [phone] LI1

## 2017-03-28 ENCOUNTER — Ambulatory Visit (HOSPITAL_COMMUNITY): Payer: Self-pay | Admitting: Psychiatry

## 2017-04-01 ENCOUNTER — Ambulatory Visit (INDEPENDENT_AMBULATORY_CARE_PROVIDER_SITE_OTHER): Payer: Medicare Other | Admitting: Family

## 2017-04-01 ENCOUNTER — Encounter: Payer: Self-pay | Admitting: Family

## 2017-04-01 VITALS — BP 121/81 | HR 72 | Temp 97.0°F | Ht 64.0 in | Wt 135.4 lb

## 2017-04-01 DIAGNOSIS — F411 Generalized anxiety disorder: Secondary | ICD-10-CM | POA: Diagnosis not present

## 2017-04-01 DIAGNOSIS — K219 Gastro-esophageal reflux disease without esophagitis: Secondary | ICD-10-CM | POA: Diagnosis not present

## 2017-04-01 DIAGNOSIS — E785 Hyperlipidemia, unspecified: Secondary | ICD-10-CM | POA: Diagnosis not present

## 2017-04-01 DIAGNOSIS — I10 Essential (primary) hypertension: Secondary | ICD-10-CM | POA: Diagnosis not present

## 2017-04-01 NOTE — Progress Notes (Signed)
   Subjective:    Patient ID: [name] Williams, female    DOB: 1927-09-22, 83 y.o.   MRN: [id number]  Pt presents to the office today for chronic follow up. Pt reports "feeling good". PT states she continues to see her councilor/behavior health  every 6 months GAD, Depression, and Insomnia.   Depression         This is a chronic problem.  The current episode started more than 1 year ago.   The onset quality is gradual.   The problem occurs intermittently.  Associated symptoms include no helplessness, no hopelessness and not sad.  Compliance with treatment is good.  Previous treatment provided moderate relief. Hyperlipidemia  This is a chronic problem. The current episode started more than 1 year ago. The problem is uncontrolled. Recent lipid tests were reviewed and are high. Current antihyperlipidemic treatment includes diet change. The current treatment provides mild improvement of lipids. Risk factors for coronary artery disease include dyslipidemia, obesity, post-menopausal and a sedentary lifestyle.  Gastroesophageal Reflux  She complains of heartburn. She reports no belching or no coughing. This is a chronic problem. The current episode started more than 1 year ago. The problem has been waxing and waning. The symptoms are aggravated by certain foods. She has tried an antacid and an herbal remedy for the symptoms. The treatment provided moderate relief.      Review of Systems  Respiratory: Negative for cough.   Cardiovascular: Positive for leg swelling.  Gastrointestinal: Positive for heartburn.  Psychiatric/Behavioral: Positive for depression.  All other systems reviewed and are negative.      Objective:   Physical Exam  Constitutional: She is oriented to person, place, and time. She appears well-developed and well-nourished. No distress.  HENT:  Head: Normocephalic and atraumatic.  Right Ear: External ear normal.  Left Ear: External ear normal.  Nose: Nose normal.  Mouth/Throat:  Oropharynx is clear and moist.  Eyes: Pupils are equal, round, and reactive to light.  Neck: Normal range of motion. Neck supple. No thyromegaly present.  Cardiovascular: Normal rate, regular rhythm, normal heart sounds and intact distal pulses.   No murmur heard. Pulmonary/Chest: Effort normal and breath sounds normal. No respiratory distress. She has no wheezes.  Abdominal: Soft. Bowel sounds are normal. She exhibits no distension. There is no tenderness.  Musculoskeletal: Normal range of motion. She exhibits edema (trace in BLE). She exhibits no tenderness.  Neurological: She is alert and oriented to person, place, and time.  Skin: Skin is warm and dry.  Psychiatric: She has a normal mood and affect. Her behavior is normal. Judgment and thought content normal.  Vitals reviewed.     BP 121/81   Pulse 72   Temp (!) 97 F (36.1 C) (Oral)   Ht '5\' 4"'$  (1.626 m)   Wt 135 lb 6.4 oz (61.4 kg)   BMI 23.24 kg/m      Assessment & Plan:  1. HYPERTENSION, BENIGN - CMP14+EGFR  2. Gastroesophageal reflux disease, esophagitis presence not specified - CMP14+EGFR  3. GAD (generalized anxiety disorder) - CMP14+EGFR  4. Hyperlipidemia, unspecified hyperlipidemia type - CMP14+EGFR - Lipid panel   Continue all meds Labs pending Health Maintenance reviewed Diet and exercise encouraged RTO 6 months   Evelina Dun, FNP

## 2017-04-01 NOTE — Patient Instructions (Signed)
Fat and Cholesterol Restricted Diet High levels of fat and cholesterol in your blood may lead to various health problems, such as diseases of the heart, blood vessels, gallbladder, liver, and pancreas. Fats are concentrated sources of energy that come in various forms. Certain types of fat, including saturated fat, may be harmful in excess. Cholesterol is a substance needed by your body in small amounts. Your body makes all the cholesterol it needs. Excess cholesterol comes from the food you eat. When you have high levels of cholesterol and saturated fat in your blood, health problems can develop because the excess fat and cholesterol will gather along the walls of your blood vessels, causing them to narrow. Choosing the right foods will help you control your intake of fat and cholesterol. This will help keep the levels of these substances in your blood within normal limits and reduce your risk of disease. What is my plan? Your health care provider recommends that you:  Limit your fat intake to ______% or less of your total calories per day.  Limit the amount of cholesterol in your diet to less than _________mg per day.  Eat 20-30 grams of fiber each day.  What types of fat should I choose?  Choose healthy fats more often. Choose monounsaturated and polyunsaturated fats, such as olive and canola oil, flaxseeds, walnuts, almonds, and seeds.  Eat more omega-3 fats. Good choices include salmon, mackerel, sardines, tuna, flaxseed oil, and ground flaxseeds. Aim to eat fish at least two times a week.  Limit saturated fats. Saturated fats are primarily found in animal products, such as meats, butter, and cream. Plant sources of saturated fats include palm oil, palm kernel oil, and coconut oil.  Avoid foods with partially hydrogenated oils in them. These contain trans fats. Examples of foods that contain trans fats are stick margarine, some tub margarines, cookies, crackers, and other baked goods. What  general guidelines do I need to follow? These guidelines for healthy eating will help you control your intake of fat and cholesterol:  Check food labels carefully to identify foods with trans fats or high amounts of saturated fat.  Fill one half of your plate with vegetables and green salads.  Fill one fourth of your plate with whole grains. Look for the word "whole" as the first word in the ingredient list.  Fill one fourth of your plate with lean protein foods.  Limit fruit to two servings a day. Choose fruit instead of juice.  Eat more foods that contain fiber, such as apples, broccoli, carrots, beans, peas, and barley.  Eat more home-cooked food and less restaurant, buffet, and fast food.  Limit or avoid alcohol.  Limit foods high in starch and sugar.  Limit fried foods.  Cook foods using methods other than frying. Baking, boiling, grilling, and broiling are all great options.  Lose weight if you are overweight. Losing just 5-10% of your initial body weight can help your overall health and prevent diseases such as diabetes and heart disease.  What foods can I eat? Grains  Whole grains, such as whole wheat or whole grain breads, crackers, cereals, and pasta. Unsweetened oatmeal, bulgur, barley, quinoa, or brown rice. Corn or whole wheat flour tortillas. Vegetables  Fresh or frozen vegetables (raw, steamed, roasted, or grilled). Green salads. Fruits  All fresh, canned (in natural juice), or frozen fruits. Meats and other protein foods  Ground beef (85% or leaner), grass-fed beef, or beef trimmed of fat. Skinless chicken or turkey. Ground chicken or turkey.   Pork trimmed of fat. All fish and seafood. Eggs. Dried beans, peas, or lentils. Unsalted nuts or seeds. Unsalted canned or dry beans. Dairy  Low-fat dairy products, such as skim or 1% milk, 2% or reduced-fat cheeses, low-fat ricotta or cottage cheese, or plain low-fat yo Fats and oils  Tub margarines without trans  fats. Light or reduced-fat mayonnaise and salad dressings. Avocado. Olive, canola, sesame, or safflower oils. Natural peanut or almond butter (choose ones without added sugar and oil). The items listed above may not be a complete list of recommended foods or beverages. Contact your dietitian for more options. Foods to avoid Grains  White bread. White pasta. White rice. Cornbread. Bagels, pastries, and croissants. Crackers that contain trans fat. Vegetables  White potatoes. Corn. Creamed or fried vegetables. Vegetables in a cheese sauce. Fruits  Dried fruits. Canned fruit in light or heavy syrup. Fruit juice. Meats and other protein foods  Fatty cuts of meat. Ribs, chicken wings, bacon, sausage, bologna, salami, chitterlings, fatback, hot dogs, bratwurst, and packaged luncheon meats. Liver and organ meats. Dairy  Whole or 2% milk, cream, half-and-half, and cream cheese. Whole milk cheeses. Whole-fat or sweetened yogurt. Full-fat cheeses. Nondairy creamers and whipped toppings. Processed cheese, cheese spreads, or cheese curds. Beverages  Alcohol. Sweetened drinks (such as sodas, lemonade, and fruit drinks or punches). Fats and oils  Butter, stick margarine, lard, shortening, ghee, or bacon fat. Coconut, palm kernel, or palm oils. Sweets and desserts  Corn syrup, sugars, honey, and molasses. Candy. Jam and jelly. Syrup. Sweetened cereals. Cookies, pies, cakes, donuts, muffins, and ice cream. The items listed above may not be a complete list of foods and beverages to avoid. Contact your dietitian for more information. This information is not intended to replace advice given to you by your health care provider. Make sure you discuss any questions you have with your health care provider. Document Released: 08/06/2005 Document Revised: 08/27/2014 Document Reviewed: 11/04/2013 [employer] Interactive Patient Education  2017 [employer]  

## 2017-04-02 LAB — CMP14+EGFR
ALK PHOS: 83 IU/L (ref 39–117)
ALT: 14 IU/L (ref 0–32)
AST: 20 IU/L (ref 0–40)
Albumin/Globulin Ratio: 2.1 (ref 1.2–2.2)
Albumin: 4.1 g/dL (ref 3.5–4.7)
BILIRUBIN TOTAL: 0.5 mg/dL (ref 0.0–1.2)
BUN/Creatinine Ratio: 21 (ref 12–28)
BUN: 23 mg/dL (ref 8–27)
CHLORIDE: 102 mmol/L (ref 96–106)
CO2: 28 mmol/L (ref 20–29)
Calcium: 9.5 mg/dL (ref 8.7–10.3)
Creatinine, Ser: 1.1 mg/dL — ABNORMAL HIGH (ref 0.57–1.00)
GFR calc non Af Amer: 45 mL/min/{1.73_m2} — ABNORMAL LOW (ref >59)
GFR, EST AFRICAN AMERICAN: 52 mL/min/{1.73_m2} — AB (ref >59)
GLUCOSE: 99 mg/dL (ref 65–99)
Globulin, Total: 2 g/dL (ref 1.5–4.5)
POTASSIUM: 4.8 mmol/L (ref 3.5–5.2)
Sodium: 142 mmol/L (ref 134–144)
Total Protein: 6.1 g/dL (ref 6.0–8.5)

## 2017-04-02 LAB — LIPID PANEL
CHOLESTEROL TOTAL: 166 mg/dL (ref 100–199)
Chol/HDL Ratio: 4.9 ratio — ABNORMAL HIGH (ref 0.0–4.4)
HDL: 34 mg/dL — ABNORMAL LOW (ref >39)
LDL Calculated: 79 mg/dL (ref 0–99)
Triglycerides: 265 mg/dL — ABNORMAL HIGH (ref 0–149)
VLDL CHOLESTEROL CAL: 53 mg/dL — AB (ref 5–40)

## 2017-05-22 ENCOUNTER — Ambulatory Visit (HOSPITAL_COMMUNITY): Payer: Self-pay | Admitting: Psychiatry

## 2017-06-03 ENCOUNTER — Encounter (HOSPITAL_COMMUNITY): Payer: Self-pay | Admitting: Psychiatry

## 2017-06-03 ENCOUNTER — Ambulatory Visit (INDEPENDENT_AMBULATORY_CARE_PROVIDER_SITE_OTHER): Payer: 59 | Admitting: Psychiatry

## 2017-06-03 VITALS — BP 125/73 | HR 77 | Ht 64.0 in | Wt 135.0 lb

## 2017-06-03 DIAGNOSIS — Z818 Family history of other mental and behavioral disorders: Secondary | ICD-10-CM | POA: Diagnosis not present

## 2017-06-03 DIAGNOSIS — R5383 Other fatigue: Secondary | ICD-10-CM

## 2017-06-03 DIAGNOSIS — F321 Major depressive disorder, single episode, moderate: Secondary | ICD-10-CM

## 2017-06-03 DIAGNOSIS — F411 Generalized anxiety disorder: Secondary | ICD-10-CM | POA: Diagnosis not present

## 2017-06-03 DIAGNOSIS — R4584 Anhedonia: Secondary | ICD-10-CM | POA: Diagnosis not present

## 2017-06-03 DIAGNOSIS — G4700 Insomnia, unspecified: Secondary | ICD-10-CM

## 2017-06-03 MED ORDER — DULOXETINE HCL 60 MG PO CPEP: 60.0000 mg | ORAL_CAPSULE | Freq: Every day | ORAL | 2 refills | Status: DC

## 2017-06-03 MED ORDER — MIRTAZAPINE 15 MG PO TABS: 15.0000 mg | ORAL_TABLET | Freq: Every day | ORAL | 2 refills | Status: DC

## 2017-06-03 NOTE — Progress Notes (Signed)
Patient ID: [name] Williams, female   DOB: July 04, 1928, 83 y.o.   MRN: [id number] Patient ID: [name] Williams, female   DOB: Feb 16, 1928, 83 y.o.   MRN: [id number] Patient ID: [name] Williams, female   DOB: 07/21/1928, 83 y.o.   MRN: [id number] Patient ID: [name] Williams, female   DOB: 06/05/1928, 83 y.o.   MRN: [id number] Patient ID: [name] Williams, female   DOB: [birth year], 83 y.o.   MRN: [id number]  Psychiatric Assessment Adult  Patient Identification:  [name] Williams Date of Evaluation:  06/03/2017 Chief Complaint: "I'm doing great History of Chief Complaint:   Chief Complaint  Patient presents with  . Depression  . Anxiety  . Follow-up    Depression         Associated symptoms include appetite change.  Past medical history includes anxiety.   Anxiety      this patient is an 83 year old widowed white female who lives with her son in [place]. She has one other son and a daughter as well as 5 grandchildren. 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 She is self-referred but goes to [facility] for her medical care.  The patient states that she's been in good health most of her life and has worked very hard both the tobacco fields and [employer]. 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 She states that her daughter-in-law started working there in a paid position. The patient's husband died in 12-08-09 after lengthy illness. The patient decided after this to take [employer] the funds she had put in her son and daughter-in-law's name back into her own name. This is when the problem started. She thinks her daughter-in-law became angry about this and started to retaliate against her. She claims the daughter-in-law made up stories about her being out of control and agitated and rude to patrons of the nutrition site. She finally got a letter in November 2014 from the Department of aging telling her that she was no longer allowed to volunteer  there.  She states that this "devastated me" all of her friends attended the site and she had been there 20 years without incident. She was unable to sleep or eat. She lost more than 30 pounds. This went on for about a year and she eventually went to [facility] last fall and was admitted for a couple of days. The doctor there put her on mirtazapine 15 mg daily at bedtime which she claims "saved my life" because she started to eat and sleep again. She is also on Wellbutrin which he thinks has helped. For a while she took Ativan and Xanax but no longer takes these.  The patient states that she decided to come to behavioral health because the problem "still eats away at me." She still doesn't talk to her daughter-in-law and his strained relationship with her's son who is married to her. She still has some trouble sleeping and only sleeps 5-6 hours a night. Is still difficult for her to eat much and she drinks ensure when she can. She has not regained much weight. She is somewhat depressed but keeps on going to her exercise classes and is very active but she claims she has to "make myself" she denies having panic attacks but is anxious. She denies suicidal ideation or any psychotic symptoms and is very bright and alert and her memory is good. Her energy is  lower than it used to be. She does have a boyfriend and they spend time together each day. She is also sad because many of her friends have died or gone into nursing homes  Thr patient returns after 3 months. She states that she is doing pretty well. Her mood has been good and she is sleeping well at night. She staying busy doing yard work since the recent storm. Her boyfriend was placed in assisted living facility and she sees him about once a week. Initially she was upset about this but she now seems to be accepting it. Her son still gives her a lot of trouble because he won't clean up after himself but she and her daughter are going to try to get  medical power of attorney so they can have him moved to facility as well Review of Systems  Constitutional: Positive for activity change, appetite change and unexpected weight change.  HENT: Negative.   Respiratory: Negative.   Cardiovascular: Negative.   Endocrine: Negative.   Genitourinary: Negative.   Musculoskeletal: Negative.   Skin: Negative.   Allergic/Immunologic: Negative.   Neurological: Negative.   Hematological: Negative.   Psychiatric/Behavioral: Positive for depression.   Physical Exam not done  Depressive Symptoms: depressed mood, anhedonia, insomnia, psychomotor retardation, anxiety, loss of energy/fatigue,  (Hypo) Manic Symptoms:   Elevated Mood:  No Irritable Mood:  No Grandiosity:  No Distractibility:  No Labiality of Mood:  No Delusions:  No Hallucinations:  No Impulsivity:  No Sexually Inappropriate Behavior:  No Financial Extravagance:  No Flight of Ideas:  No  Anxiety Symptoms: Excessive Worry:  Yes Panic Symptoms:  No Agoraphobia:  No Obsessive Compulsive: No  Symptoms: None, Specific Phobias:  No Social Anxiety:  No  Psychotic Symptoms:  Hallucinations: No None Delusions:  No Paranoia:  No   Ideas of Reference:  No  PTSD Symptoms: Ever had a traumatic exposure:  No Had a traumatic exposure in the last month:  No Re-experiencing: No None Hypervigilance:  No Hyperarousal: No None Avoidance: No None  Traumatic Brain Injury: No  Past Psychiatric History: Diagnosis: Depression and anxiety   Hospitalizations: none  Outpatient Care: none except medication management by primary care   Substance Abuse Care: none  Self-Mutilation: none  Suicidal Attempts:none  Violent Behaviors: none   Past Medical History:   Past Medical History:  Diagnosis Date  . Anxiety   . Depressive disorder, not elsewhere classified   . Esophageal reflux   . Essential hypertension, benign   . Hypertr obst cardiomyop   . Other and unspecified  hyperlipidemia   . Stroke ([place])   . Unspecified transient cerebral ischemia    History of Loss of Consciousness:  No Seizure History:  No Cardiac History:  No Allergies:   Allergies  Allergen Reactions  . Latex     Per pt 11-10-14 she do not remember being allergic to this.../or   Current Medications:  Current Outpatient Prescriptions  Medication Sig Dispense Refill  . aspirin 81 MG EC tablet Take 81 mg by mouth every other day.     . diclofenac sodium (VOLTAREN) 1 % GEL APPLY 2 GMS TO AFFECTED LEFT SHOULDER 3 X DAILY  0  . DULoxetine (CYMBALTA) 60 MG capsule Take 1 capsule (60 mg total) by mouth daily. 90 capsule 2  . ENSURE (ENSURE) Take 237 mLs by mouth.    . mirtazapine (REMERON) 15 MG tablet Take 1 tablet (15 mg total) by mouth at bedtime. 90 tablet 2  .  Multiple Vitamin tablet Take by mouth daily.    Marland Kitchen omeprazole (PRILOSEC) 20 MG capsule TAKE 1 CAPSULE BY MOUTH  DAILY 90 capsule 1   No current facility-administered medications for this visit.     Previous Psychotropic Medications:  Medication Dose   Wellbutrin XL, Remeron, Xanax                        Substance Abuse History in the last 12 months: Substance Age of 1st Use Last Use Amount Specific Type  Nicotine      Alcohol      Cannabis      Opiates      Cocaine      Methamphetamines      LSD      Ecstasy      Benzodiazepines      Caffeine      Inhalants      Others:                          Medical Consequences of Substance Abuse: none  Legal Consequences of Substance Abuse: none  Family Consequences of Substance Abuse: none  Blackouts:  No DT's:  No Withdrawal Symptoms:  No None  Social History: Current Place of Residence: [place] of Birth: [place] Family Members: 6 living siblings, 3 children, 5 grandchildren Marital Status:  Widowed Children:   Sons: 2  Daughters: 1 Relationships: Has a steady boyfriend Education:  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 Legal History: none Hobbies/Interests: Exercise class church friends  Family History:   Family History  Problem Relation Age of Onset  . Breast cancer Mother   . Stomach cancer Sister   . Uterine cancer Sister   . Depression Sister     Mental Status Examination/Evaluation: Objective:  Appearance: Casual, Neat and Well Groomed  Eye Contact::  Good  Speech:  Clear and Coherent  Volume: Normal   Mood:  good   Affect:  Bright   Thought Process:  Circumstantial and Goal Directed  Orientation:  Full (Time, Place, and Person)  Thought Content:  Rumination  Suicidal Thoughts:  No  Homicidal Thoughts:  No  Judgement:  Good  Insight:  Fair  Psychomotor Activity:  Normal  Akathisia:  No  Handed:  Right  AIMS (if indicated):    Assets:  Communication Skills Desire for Improvement Physical Health Resilience Social Support    Laboratory/X-Ray Psychological Evaluation(s)   Recent labs are all within normal limits      Assessment:  Axis I: Generalized Anxiety Disorder and Major Depression, single episode  AXIS I Generalized Anxiety Disorder and Major Depression, single episode  AXIS II Deferred  AXIS III Past Medical History:  Diagnosis Date  . Anxiety   . Depressive disorder, not elsewhere classified   . Esophageal reflux   . Essential hypertension, benign   . Hypertr obst cardiomyop   . Other and unspecified hyperlipidemia   . Stroke ([place])   . Unspecified transient cerebral ischemia      AXIS IV other psychosocial or environmental problems  AXIS V 51-60 moderate symptoms   Treatment Plan/Recommendations:  Plan of Care: Medication management   Laboratory:   Psychotherapy: She will be assigned a counselor here   Medications: . She'll  continue  mirtazapine 15 mg .at bedtime to help with anxiety sleep and appetite. She will continue Cymbalta 60 mg daily for depression  Routine PRN Medications:  No  Consultations:   Safety Concerns:  She denies thoughts of harm to self or others   Other: She'll return in 3 months     Levonne Spiller, MD 10/15/20182:12 PM

## 2017-06-10 ENCOUNTER — Ambulatory Visit (INDEPENDENT_AMBULATORY_CARE_PROVIDER_SITE_OTHER): Payer: Medicare Other | Admitting: *Deleted

## 2017-06-10 ENCOUNTER — Encounter: Payer: Self-pay | Admitting: *Deleted

## 2017-06-10 VITALS — BP 125/80 | HR 76 | Ht 63.5 in | Wt 135.0 lb

## 2017-06-10 DIAGNOSIS — Z23 Encounter for immunization: Secondary | ICD-10-CM

## 2017-06-10 DIAGNOSIS — Z0000 Encounter for general adult medical examination without abnormal findings: Secondary | ICD-10-CM

## 2017-06-10 NOTE — Patient Instructions (Signed)
  [name] Williams , Thank you for taking time to come for your Medicare Wellness Visit. I appreciate your ongoing commitment to your health goals. Please review the following plan we discussed and let me know if I can assist you in the future.   These are the goals we discussed: Goals    . Exercise 150 minutes per week (moderate activity)          Continue to stay active. Consider going back to the recreation department to exercise.        This is a list of the screening recommended for you and due dates:  Health Maintenance  Topic Date Due  . Tetanus Vaccine  12/21/1948  . DEXA scan (bone density measurement)  01/06/2017  . Pneumonia vaccines (2 of 2 - PPSV23) 04/12/2017  . Flu Shot  06/14/2017*  *Topic was postponed. The date shown is not the original due date.

## 2017-06-10 NOTE — Progress Notes (Signed)
Subjective:   [name] Williams is a 83 y.o. female who presents for an Initial Medicare Annual Wellness Visit. Ms Catapano lives at home. Her son lives in her basement and is dependent on her for rides and some care. He is disabled and drink alcohol heavily. She has one other adult son and one adult daughter. 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  Review of Systems    Health is about the same as last year.   Cardiac Risk Factors include: advanced age (>61men, >25 women);hypertension   Other systems negative today.      Objective:    BP 125/80 (BP Location: Left Arm, Patient Position: Sitting, Cuff Size: Normal)   Pulse 76   Ht 5' 3.5" (1.613 m)   Wt 135 lb (61.2 kg)   BMI 23.54 kg/m    Current Medications (verified) Outpatient Encounter Prescriptions as of 06/10/2017  Medication Sig  . aspirin 81 MG EC tablet Take 81 mg by mouth every other day.   . DULoxetine (CYMBALTA) 60 MG capsule Take 1 capsule (60 mg total) by mouth daily.  Marland Kitchen ENSURE (ENSURE) Take 237 mLs by mouth.  . mirtazapine (REMERON) 15 MG tablet Take 1 tablet (15 mg total) by mouth at bedtime.  . Multiple Vitamin tablet Take by mouth daily.  . [DISCONTINUED] diclofenac sodium (VOLTAREN) 1 % GEL APPLY 2 GMS TO AFFECTED LEFT SHOULDER 3 X DAILY  . [DISCONTINUED] omeprazole (PRILOSEC) 20 MG capsule TAKE 1 CAPSULE BY MOUTH  DAILY   No facility-administered encounter medications on file as of 06/10/2017.     Allergies (verified) Latex   History: Past Medical History:  Diagnosis Date  . Anxiety   . Depressive disorder, not elsewhere classified   . Esophageal reflux   . Essential hypertension, benign   . Hypertr obst cardiomyop   . Other and unspecified hyperlipidemia   . Stroke ([place])   . Unspecified transient cerebral ischemia    Past Surgical History:  Procedure Laterality Date  . BLADDER REPAIR    . CERVIX SURGERY    .  CHOLECYSTECTOMY    . TONSILLECTOMY    . TUBAL LIGATION     Family History  Problem Relation Age of Onset  . Breast cancer Mother   . Stomach cancer Sister   . Uterine cancer Sister   . Depression Sister   . Cancer Brother   . Alcohol abuse Son    Social History   Occupational History  . Not on file.   Social History Main Topics  . Smoking status: Former Research scientist (life sciences)  . Smokeless tobacco: Never Used     Comment: quit 2012  . Alcohol use No  . Drug use: No  . Sexual activity: Not Currently    Tobacco Counseling No tobacco use  Activities of Daily Living In your present state of health, do you have any difficulty performing the following activities: 06/10/2017  Hearing? N  Vision? N  Difficulty concentrating or making decisions? N  Walking or climbing stairs? N  Dressing or bathing? N  Doing errands, shopping? N  Preparing Food and eating ? N  Using the Toilet? N  In the past six months, have you accidently leaked urine? N  Do you have problems with loss of bowel control? N  Managing your Medications? N  Managing your Finances? N  Housekeeping or managing your Housekeeping? N  Some recent  data might be hidden    Immunizations and Health Maintenance Immunization History  Administered Date(s) Administered  . Influenza, High Dose Seasonal PF 06/10/2017  . Influenza-Unspecified 06/01/2015  . Pneumococcal Conjugate-13 04/12/2016   Health Maintenance Due  Topic Date Due  . TETANUS/TDAP  12/21/1948  . DEXA SCAN  01/06/2017  . PNA vac Low Risk Adult (2 of 2 - PPSV23) 04/12/2017    Patient Care Team: Sharion Balloon, FNP as PCP - General (Nurse Practitioner)  Venetia Night, OD-optometry  No hospitalizations, ER visits, or surgeries this past year.     Assessment:   This is a routine wellness examination for [name] K.   Hearing/Vision screen No deficits noted during visit. Eye exam due.   Dietary issues and exercise activities discussed: Current Exercise Habits:  Home exercise routine (Patient goes dancing twice a week. She was exercising at the recreation department regularly but has gotten out of practice. She would like to start back  again soon. ), Type of exercise: Other - see comments (Dancing), Time (Minutes): 30, Frequency (Times/Week): 2, Weekly Exercise (Minutes/Week): 60, Intensity: Moderate, Exercise limited by: None identified  Goals    . Exercise 150 minutes per week (moderate activity)          Continue to stay active. Consider going back to the recreation department to exercise.       Depression Screen PHQ 2/9 Scores 06/10/2017 04/01/2017 10/15/2016 07/13/2016 05/31/2016 04/12/2016 10/13/2015  PHQ - 2 Score 1 - - 3 2 1  0  PHQ- 9 Score - - - 6 4 2  -  Exception Documentation - Other- indicate reason in comment box Other- indicate reason in comment box - - - -  Not completed - Patient goes for therapy  with Dr. Harrington Challenger. Patient is being treated by Dr. Harrington Challenger for mental health. - - - -    Fall Risk Fall Risk  06/10/2017 04/01/2017 03/21/2017 10/15/2016 07/13/2016  Falls in the past year? Yes Yes Yes No No  Number falls in past yr: 1 1 1  - -  Injury with Fall? No No No - -  Comment Left shoulder pain. Evaluated at urgent care and no fracture seen.  - - - -  Follow up Falls prevention discussed - - - -    Cognitive Function: MMSE - Mini Mental State Exam 06/10/2017  Orientation to time 3  Orientation to Place 3  Registration 3  Attention/ Calculation 4  Recall 3  Language- name 2 objects 2  Language- repeat 1  Language- follow 3 step command 3  Language- read & follow direction 1  Write a sentence 1  Copy design 0  Total score 24        Screening Tests Health Maintenance  Topic Date Due  . TETANUS/TDAP  12/21/1948  . DEXA SCAN  01/06/2017  . PNA vac Low Risk Adult (2 of 2 - PPSV23) 04/12/2017  . INFLUENZA VACCINE  06/14/2017 (Originally 03/20/2017)      Plan:  Pneumovax and Flu given today.  Keep f/u with PCP Bring a copy of  Advance Directives for your chart Stay active. Consider rejoining senior exercise program at recreation department  I have personally reviewed and noted the following in the patient's chart:   . Medical and social history . Use of alcohol, tobacco or illicit drugs  . Current medications and supplements . Functional ability and status . Nutritional status . Physical activity . Advanced directives . List of other physicians . Hospitalizations, surgeries, and ER  visits in previous 12 months . Vitals . Screenings to include cognitive, depression, and falls . Referrals and appointments  In addition, I have reviewed and discussed with patient certain preventive protocols, quality metrics, and best practice recommendations. A written personalized care plan for preventive services as well as general preventive health recommendations were provided to patient.     Chong Sicilian, RN  06/10/2017

## 2017-10-02 ENCOUNTER — Encounter: Payer: Self-pay | Admitting: Family

## 2017-10-02 ENCOUNTER — Ambulatory Visit (INDEPENDENT_AMBULATORY_CARE_PROVIDER_SITE_OTHER): Payer: Medicare HMO | Admitting: Family

## 2017-10-02 VITALS — BP 126/77 | HR 74 | Temp 97.8°F | Ht 63.5 in | Wt 134.4 lb

## 2017-10-02 DIAGNOSIS — K219 Gastro-esophageal reflux disease without esophagitis: Secondary | ICD-10-CM

## 2017-10-02 DIAGNOSIS — F411 Generalized anxiety disorder: Secondary | ICD-10-CM | POA: Diagnosis not present

## 2017-10-02 DIAGNOSIS — G4700 Insomnia, unspecified: Secondary | ICD-10-CM | POA: Diagnosis not present

## 2017-10-02 DIAGNOSIS — F3342 Major depressive disorder, recurrent, in full remission: Secondary | ICD-10-CM

## 2017-10-02 DIAGNOSIS — I10 Essential (primary) hypertension: Secondary | ICD-10-CM | POA: Diagnosis not present

## 2017-10-02 DIAGNOSIS — E785 Hyperlipidemia, unspecified: Secondary | ICD-10-CM | POA: Diagnosis not present

## 2017-10-02 NOTE — Progress Notes (Signed)
   Subjective:    Patient ID: [name] Williams, female    DOB: 1928-08-19, 84 y.o.   MRN: [id number]  Pt presents to the office today for chronic follow up. Pt reports "feeling good". PT states she has stopped seeing her councilor/behavior GAD, Depression, and Insomnia because "I do not need to see them any more".  Hyperlipidemia  This is a chronic problem. The current episode started more than 1 year ago. The problem is controlled. Recent lipid tests were reviewed and are normal. Current antihyperlipidemic treatment includes diet change. The current treatment provides moderate improvement of lipids.  Depression         This is a chronic problem.  The onset quality is gradual.   The problem occurs intermittently.  The problem has been waxing and waning since onset.  Associated symptoms include insomnia.  Associated symptoms include no helplessness, no hopelessness, not irritable, no decreased interest and not sad.  Past treatments include SNRIs - Serotonin and norepinephrine reuptake inhibitors.  Compliance with treatment is good. Gastroesophageal Reflux  She reports no belching, no coughing or no heartburn. This is a chronic problem. The problem occurs rarely. The problem has been resolved. She has tried a PPI for the symptoms.  Insomnia  Primary symptoms: difficulty falling asleep, frequent awakening.  The current episode started more than one year. The problem occurs intermittently. The problem has been waxing and waning since onset. The symptoms are aggravated by anxiety. PMH includes: depression.      Review of Systems  Respiratory: Negative for cough.   Gastrointestinal: Negative for heartburn.  Psychiatric/Behavioral: Positive for depression. The patient has insomnia.   All other systems reviewed and are negative.      Objective:   Physical Exam  Constitutional: She is oriented to person, place, and time. She appears well-developed and well-nourished. She is not irritable. No distress.    HENT:  Head: Normocephalic and atraumatic.  Right Ear: External ear normal.  Left Ear: External ear normal.  Nose: Nose normal.  Mouth/Throat: Oropharynx is clear and moist.  Eyes: Pupils are equal, round, and reactive to light.  Neck: Normal range of motion. Neck supple. No thyromegaly present.  Cardiovascular: Normal rate, regular rhythm, normal heart sounds and intact distal pulses.  No murmur heard. Pulmonary/Chest: Effort normal and breath sounds normal. No respiratory distress. She has no wheezes.  Abdominal: Soft. Bowel sounds are normal. She exhibits no distension. There is no tenderness.  Musculoskeletal: Normal range of motion. She exhibits edema (trace in feet). She exhibits no tenderness.  Neurological: She is alert and oriented to person, place, and time.  Skin: Skin is warm and dry.  Psychiatric: She has a normal mood and affect. Her behavior is normal. Judgment and thought content normal.  Vitals reviewed.     BP 126/77   Pulse 74   Temp 97.8 F (36.6 C) (Oral)   Ht 5' 3.5" (1.613 m)   Wt 134 lb 6.4 oz (61 kg)   BMI 23.43 kg/m      Assessment & Plan:  1. HYPERTENSION, BENIGN - CMP14+EGFR  2. Gastroesophageal reflux disease, esophagitis presence not specified - CMP14+EGFR  3. Hyperlipidemia, unspecified hyperlipidemia type - CMP14+EGFR  4. Recurrent major depressive disorder, in full remission ([place]) - CMP14+EGFR  5. GAD (generalized anxiety disorder)  - CMP14+EGFR  6. Insomnia, unspecified type - CMP14+EGFR   Continue all meds Labs pending Health Maintenance reviewed Diet and exercise encouraged RTO 6 months   Evelina Dun, FNP

## 2017-10-02 NOTE — Patient Instructions (Signed)

## 2017-10-03 LAB — CMP14+EGFR
ALK PHOS: 83 IU/L (ref 39–117)
ALT: 9 IU/L (ref 0–32)
AST: 19 IU/L (ref 0–40)
Albumin/Globulin Ratio: 1.6 (ref 1.2–2.2)
Albumin: 4.1 g/dL (ref 3.5–4.7)
BUN/Creatinine Ratio: 13 (ref 12–28)
BUN: 17 mg/dL (ref 8–27)
Bilirubin Total: 0.7 mg/dL (ref 0.0–1.2)
CALCIUM: 9.5 mg/dL (ref 8.7–10.3)
CO2: 27 mmol/L (ref 20–29)
CREATININE: 1.34 mg/dL — AB (ref 0.57–1.00)
Chloride: 102 mmol/L (ref 96–106)
GFR calc Af Amer: 41 mL/min/{1.73_m2} — ABNORMAL LOW (ref >59)
GFR, EST NON AFRICAN AMERICAN: 36 mL/min/{1.73_m2} — AB (ref >59)
Globulin, Total: 2.6 g/dL (ref 1.5–4.5)
Glucose: 83 mg/dL (ref 65–99)
POTASSIUM: 4.9 mmol/L (ref 3.5–5.2)
Sodium: 144 mmol/L (ref 134–144)
Total Protein: 6.7 g/dL (ref 6.0–8.5)

## 2018-04-14 ENCOUNTER — Ambulatory Visit (INDEPENDENT_AMBULATORY_CARE_PROVIDER_SITE_OTHER): Payer: Medicare HMO | Admitting: Family

## 2018-04-14 ENCOUNTER — Encounter: Payer: Self-pay | Admitting: Family

## 2018-04-14 VITALS — BP 135/86 | HR 78 | Temp 98.2°F | Ht 63.5 in | Wt 140.2 lb

## 2018-04-14 DIAGNOSIS — K219 Gastro-esophageal reflux disease without esophagitis: Secondary | ICD-10-CM

## 2018-04-14 DIAGNOSIS — E785 Hyperlipidemia, unspecified: Secondary | ICD-10-CM | POA: Diagnosis not present

## 2018-04-14 DIAGNOSIS — F339 Major depressive disorder, recurrent, unspecified: Secondary | ICD-10-CM | POA: Diagnosis not present

## 2018-04-14 DIAGNOSIS — F411 Generalized anxiety disorder: Secondary | ICD-10-CM | POA: Diagnosis not present

## 2018-04-14 MED ORDER — DULOXETINE HCL 60 MG PO CPEP: 60.0000 mg | ORAL_CAPSULE | Freq: Every day | ORAL | 2 refills | Status: DC

## 2018-04-14 MED ORDER — MIRTAZAPINE 15 MG PO TABS: 15.0000 mg | ORAL_TABLET | Freq: Every day | ORAL | 2 refills | Status: DC

## 2018-04-14 NOTE — Patient Instructions (Signed)

## 2018-04-14 NOTE — Progress Notes (Signed)
 Subjective:    Patient ID: [name] Williams, female    DOB: 11/21/1927, 84 y.o.   MRN: [id number]  Chief Complaint  Patient presents with  . Medical Management of Chronic Issues   Pt presents to the office today for chronic follow up. Pt reports "feeling good". PT states she has stopped seeing her councilor/behavior GAD, Depression, and Insomnia because "I do not need to see them any more".  Hyperlipidemia  This is a chronic problem. The current episode started more than 1 year ago. The problem is controlled. Recent lipid tests were reviewed and are normal. Current antihyperlipidemic treatment includes diet change. The current treatment provides moderate improvement of lipids. Risk factors for coronary artery disease include dyslipidemia, a sedentary lifestyle and post-menopausal.  Gastroesophageal Reflux  She reports no belching, no coughing or no heartburn. This is a chronic problem. The current episode started more than 1 year ago. The problem occurs occasionally. The problem has been waxing and waning. She has tried a diet change for the symptoms. The treatment provided moderate relief.  Depression         This is a chronic problem.  The current episode started more than 1 year ago.   The onset quality is gradual.   The problem occurs intermittently.  Associated symptoms include insomnia, restlessness, decreased interest and sad.  Associated symptoms include no helplessness and no hopelessness.     The symptoms are aggravated by family issues (her boyfriend of 7 years has recently passed away).  Past treatments include SNRIs - Serotonin and norepinephrine reuptake inhibitors.  Past medical history includes anxiety.   Anxiety  Presents for follow-up visit. Symptoms include excessive worry, insomnia, irritability, nervous/anxious behavior and restlessness. Symptoms occur occasionally. The severity of symptoms is moderate.        Review of Systems  Constitutional: Positive for irritability.    Respiratory: Negative for cough.   Gastrointestinal: Negative for heartburn.  Psychiatric/Behavioral: Positive for depression. The patient is nervous/anxious and has insomnia.   All other systems reviewed and are negative.      Objective:   Physical Exam  Constitutional: She is oriented to person, place, and time. She appears well-developed and well-nourished. No distress.  HENT:  Head: Normocephalic and atraumatic.  Right Ear: External ear normal.  Left Ear: External ear normal.  Mouth/Throat: Oropharynx is clear and moist.  Eyes: Pupils are equal, round, and reactive to light.  Neck: Normal range of motion. Neck supple. No thyromegaly present.  Cardiovascular: Normal rate, regular rhythm, normal heart sounds and intact distal pulses.  No murmur heard. Pulmonary/Chest: Effort normal and breath sounds normal. No respiratory distress. She has no wheezes.  Abdominal: Soft. Bowel sounds are normal. She exhibits no distension. There is no tenderness.  Musculoskeletal: Normal range of motion. She exhibits no edema (trace bilateral ankles) or tenderness.  Neurological: She is alert and oriented to person, place, and time. She has normal reflexes. No cranial nerve deficit.  Skin: Skin is warm and dry.  Psychiatric: She has a normal mood and affect. Her behavior is normal. Judgment and thought content normal.  Vitals reviewed.    BP 135/86   Pulse 78   Temp 98.2 F (36.8 C) (Oral)   Ht 5' 3.5" (1.613 m)   Wt 140 lb 3.2 oz (63.6 kg)   BMI 24.45 kg/m      Assessment & Plan:  [name] Williams comes in today with chief complaint of Medical Management of Chronic Issues   Diagnosis   and orders addressed:  1. GAD (generalized anxiety disorder) - CBC with Differential/Platelet - CMP14+EGFR - DULoxetine (CYMBALTA) 60 MG capsule; Take 1 capsule (60 mg total) by mouth daily.  Dispense: 90 capsule; Refill: 2 - mirtazapine (REMERON) 15 MG tablet; Take 1 tablet (15 mg total) by mouth at  bedtime.  Dispense: 90 tablet; Refill: 2  2. Hyperlipidemia, unspecified hyperlipidemia type - CBC with Differential/Platelet - CMP14+EGFR  3. Gastroesophageal reflux disease, esophagitis presence not specified - CBC with Differential/Platelet - CMP14+EGFR  4. Episode of recurrent major depressive disorder, unspecified depression episode severity (HCC) - CBC with Differential/Platelet - CMP14+EGFR - DULoxetine (CYMBALTA) 60 MG capsule; Take 1 capsule (60 mg total) by mouth daily.  Dispense: 90 capsule; Refill: 2   Labs pending Health Maintenance reviewed Diet and exercise encouraged  Follow up plan: 6 months    Christy Hawks, FNP  

## 2018-04-16 LAB — CBC WITH DIFFERENTIAL/PLATELET
BASOS: 0 %
Basophils Absolute: 0.1 10*3/uL (ref 0.0–0.2)
EOS (ABSOLUTE): 0.4 10*3/uL (ref 0.0–0.4)
Eos: 2 %
HEMATOCRIT: 47 % — AB (ref 34.0–46.6)
HEMOGLOBIN: 15.2 g/dL (ref 11.1–15.9)
Immature Grans (Abs): 0 10*3/uL (ref 0.0–0.1)
Immature Granulocytes: 0 %
Lymphocytes Absolute: 11.3 10*3/uL — ABNORMAL HIGH (ref 0.7–3.1)
Lymphs: 65 %
MCH: 30.9 pg (ref 26.6–33.0)
MCHC: 32.3 g/dL (ref 31.5–35.7)
MCV: 96 fL (ref 79–97)
MONOS ABS: 1.3 10*3/uL — AB (ref 0.1–0.9)
Monocytes: 7 %
NEUTROS ABS: 4.4 10*3/uL (ref 1.4–7.0)
Neutrophils: 26 %
Platelets: 212 10*3/uL (ref 150–450)
RBC: 4.92 x10E6/uL (ref 3.77–5.28)
RDW: 13.5 % (ref 12.3–15.4)
WBC: 17.4 10*3/uL — ABNORMAL HIGH (ref 3.4–10.8)

## 2018-04-16 LAB — CMP14+EGFR
A/G RATIO: 1.8 (ref 1.2–2.2)
ALT: 10 IU/L (ref 0–32)
AST: 14 IU/L (ref 0–40)
Albumin: 4.2 g/dL (ref 3.5–4.7)
Alkaline Phosphatase: 106 IU/L (ref 39–117)
BILIRUBIN TOTAL: 0.4 mg/dL (ref 0.0–1.2)
BUN / CREAT RATIO: 13 (ref 12–28)
BUN: 17 mg/dL (ref 8–27)
CHLORIDE: 101 mmol/L (ref 96–106)
CO2: 26 mmol/L (ref 20–29)
Calcium: 9.1 mg/dL (ref 8.7–10.3)
Creatinine, Ser: 1.32 mg/dL — ABNORMAL HIGH (ref 0.57–1.00)
GFR calc Af Amer: 42 mL/min/{1.73_m2} — ABNORMAL LOW (ref >59)
GFR calc non Af Amer: 36 mL/min/{1.73_m2} — ABNORMAL LOW (ref >59)
GLOBULIN, TOTAL: 2.3 g/dL (ref 1.5–4.5)
Glucose: 98 mg/dL (ref 65–99)
Potassium: 4.8 mmol/L (ref 3.5–5.2)
Sodium: 141 mmol/L (ref 134–144)
TOTAL PROTEIN: 6.5 g/dL (ref 6.0–8.5)

## 2018-04-25 ENCOUNTER — Other Ambulatory Visit: Payer: Medicare HMO

## 2018-04-25 ENCOUNTER — Other Ambulatory Visit: Payer: Self-pay | Admitting: *Deleted

## 2018-04-25 DIAGNOSIS — D72829 Elevated white blood cell count, unspecified: Secondary | ICD-10-CM

## 2018-04-26 LAB — CBC WITH DIFFERENTIAL/PLATELET
BASOS ABS: 0.1 10*3/uL (ref 0.0–0.2)
Basos: 1 %
EOS (ABSOLUTE): 0.4 10*3/uL (ref 0.0–0.4)
EOS: 3 %
HEMATOCRIT: 45.3 % (ref 34.0–46.6)
Hemoglobin: 15.1 g/dL (ref 11.1–15.9)
Immature Grans (Abs): 0 10*3/uL (ref 0.0–0.1)
Immature Granulocytes: 0 %
Lymphocytes Absolute: 9.4 10*3/uL — ABNORMAL HIGH (ref 0.7–3.1)
Lymphs: 61 %
MCH: 30.6 pg (ref 26.6–33.0)
MCHC: 33.3 g/dL (ref 31.5–35.7)
MCV: 92 fL (ref 79–97)
MONOS ABS: 1.1 10*3/uL — AB (ref 0.1–0.9)
Monocytes: 7 %
Neutrophils Absolute: 4.2 10*3/uL (ref 1.4–7.0)
Neutrophils: 28 %
Platelets: 210 10*3/uL (ref 150–450)
RBC: 4.93 x10E6/uL (ref 3.77–5.28)
RDW: 12.8 % (ref 12.3–15.4)
WBC: 15.2 10*3/uL — AB (ref 3.4–10.8)

## 2018-04-29 ENCOUNTER — Telehealth: Payer: Self-pay | Admitting: Family

## 2018-04-29 NOTE — Telephone Encounter (Signed)
Pt notified of results Verbalizes understanding 

## 2018-04-29 NOTE — Telephone Encounter (Signed)
DUP note 

## 2018-04-29 NOTE — Telephone Encounter (Signed)
Pt notified of lab results Verbalizes understanding Denies sxs at this time

## 2018-04-29 NOTE — Telephone Encounter (Signed)
-----   Message from Sharion Balloon, [place] sent at 04/28/2018  8:14 AM EDT ----- WBC improved, but still elevated Let me know if you have any fevers, dysuria, or cough

## 2018-05-07 ENCOUNTER — Ambulatory Visit (INDEPENDENT_AMBULATORY_CARE_PROVIDER_SITE_OTHER): Payer: Medicare HMO | Admitting: Family Medicine

## 2018-05-07 ENCOUNTER — Encounter: Payer: Self-pay | Admitting: Family Medicine

## 2018-05-07 VITALS — BP 125/84 | HR 74 | Temp 97.5°F | Ht 63.5 in | Wt 138.4 lb

## 2018-05-07 DIAGNOSIS — R5383 Other fatigue: Secondary | ICD-10-CM

## 2018-05-07 DIAGNOSIS — R6889 Other general symptoms and signs: Secondary | ICD-10-CM | POA: Diagnosis not present

## 2018-05-07 DIAGNOSIS — F70 Mild intellectual disabilities: Secondary | ICD-10-CM

## 2018-05-07 DIAGNOSIS — D72829 Elevated white blood cell count, unspecified: Secondary | ICD-10-CM

## 2018-05-07 NOTE — Progress Notes (Signed)
BP 125/84   Pulse 74   Temp (!) 97.5 F (36.4 C) (Oral)   Ht 5' 3.5" (1.613 m)   Wt 62.8 kg   SpO2 97%   BMI 24.13 kg/m    Subjective:    Patient ID: [name] Williams, female    DOB: March 31, 1928, 84 y.o.   MRN: [id number]  HPI: [name] Williams is a 84 y.o. female presenting on 05/07/2018 for Fatigue (x 3 weeks- Patient states she feels like this all the time. Elevated WBC on last labs.); balance feels off; and Dizziness  Patient comes in complaining of fatigue and dizziness and feeling off balance.  She started having the fatigue 3 weeks ago that has progressed in the balance and dizziness issues more recently.  She denies any headaches or vision changes or focal numbness or weakness.  She describes the dizziness as a feeling of just being off or off balance.  Patient was seen about 3 weeks ago for a follow up appointment. Her lab work then showed increased WBCs, so follow up blood work was completed, showing a decrease in her elevated WBC count. Patient has recently been through a lot of stress recently due to the death of a long-term relationship and her son being in and out of the emergency room. She has never had much of an appetite, but has noticed she has not been eating much. She has starting using Ensure as a supplement. She feels off balance some, and has no energy. She says this is how she felt when she has gallstones many years ago. About a week ago she had diarrhea that persisted for about a week, but now has normal bowel movements. She sleeps well at night, but still feels fatigued during the day. She has not had any fevers, chills, or abdominal pain.   Relevant past medical, surgical, family and social history reviewed and updated as indicated. Interim medical history since our last visit reviewed. Allergies and medications reviewed and updated.  Review of Systems  Constitutional: Positive for fatigue. Negative for chills and fever.  HENT: Negative for congestion, ear pain, sinus  pressure and sore throat.   Eyes: Negative for visual disturbance.  Respiratory: Negative for cough and shortness of breath.   Cardiovascular: Negative for chest pain and palpitations.  Gastrointestinal: Positive for diarrhea (One week ago, currently resolved). Negative for abdominal pain, nausea and vomiting.  Genitourinary: Negative for difficulty urinating, flank pain and frequency.  Musculoskeletal: Negative for arthralgias and myalgias.  Neurological: Positive for dizziness (Offbalance) and weakness (Feels like she has no energy). Negative for light-headedness and headaches.  Hematological: Does not bruise/bleed easily.  Psychiatric/Behavioral: Positive for confusion (Some mental fogginess). Negative for agitation and sleep disturbance. The patient is not nervous/anxious.     Per HPI unless specifically indicated above   Allergies as of 05/07/2018      Reactions   Latex    Per pt 11-10-14 she do not remember being allergic to this.../or      Medication List        Accurate as of 05/07/18 10:58 AM. Always use your most recent med list.          aspirin 81 MG EC tablet Take 81 mg by mouth every other day.   DULoxetine 60 MG capsule Commonly known as:  CYMBALTA Take 1 capsule (60 mg total) by mouth daily.   ENSURE Take 237 mLs by mouth.   mirtazapine 15 MG tablet Commonly known as:  REMERON Take 1  tablet (15 mg total) by mouth at bedtime.   Multiple Vitamin tablet Take by mouth daily.          Objective:    BP 125/84   Pulse 74   Temp (!) 97.5 F (36.4 C) (Oral)   Ht 5' 3.5" (1.613 m)   Wt 62.8 kg   SpO2 97%   BMI 24.13 kg/m   Wt Readings from Last 3 Encounters:  05/07/18 62.8 kg  04/14/18 63.6 kg  10/02/17 61 kg    Physical Exam  Constitutional: She is oriented to person, place, and time. She appears well-developed.  Cardiovascular: Normal rate, regular rhythm, normal heart sounds and intact distal pulses.  Pulmonary/Chest: Effort normal and breath  sounds normal.  Abdominal: Soft. Bowel sounds are normal. She exhibits no distension. There is no tenderness.  Musculoskeletal: Normal range of motion. She exhibits no edema.  Lymphadenopathy:    She has no cervical adenopathy.  Neurological: She is alert and oriented to person, place, and time. She displays normal reflexes. No cranial nerve deficit. Coordination normal.  Psychiatric: She has a normal mood and affect. Her behavior is normal.    Results for orders placed or performed in visit on 04/25/18  CBC with Differential/Platelet  Result Value Ref Range   WBC 15.2 (H) 3.4 - 10.8 x10E3/uL   RBC 4.93 3.77 - 5.28 x10E6/uL   Hemoglobin 15.1 11.1 - 15.9 g/dL   Hematocrit 45.3 34.0 - 46.6 %   MCV 92 79 - 97 fL   MCH 30.6 26.6 - 33.0 pg   MCHC 33.3 31.5 - 35.7 g/dL   RDW 12.8 12.3 - 15.4 %   Platelets 210 150 - 450 x10E3/uL   Neutrophils 28 Not Estab. %   Lymphs 61 Not Estab. %   Monocytes 7 Not Estab. %   Eos 3 Not Estab. %   Basos 1 Not Estab. %   Neutrophils Absolute 4.2 1.4 - 7.0 x10E3/uL   Lymphocytes Absolute 9.4 (H) 0.7 - 3.1 x10E3/uL   Monocytes Absolute 1.1 (H) 0.1 - 0.9 x10E3/uL   EOS (ABSOLUTE) 0.4 0.0 - 0.4 x10E3/uL   Basophils Absolute 0.1 0.0 - 0.2 x10E3/uL   Immature Granulocytes 0 Not Estab. %   Immature Grans (Abs) 0.0 0.0 - 0.1 x10E3/uL      Assessment & Plan:   Problem List Items Addressed This Visit      Other   Fatigue - Primary   Relevant Orders   CBC with Differential/Platelet (Completed)   TSH (Completed)   Lyme Ab/Western Blot Reflex (Completed)   Rocky mtn spotted fvr abs pnl(IgG+IgM) (Completed)   CMP14+EGFR (Completed)   Vitamin B12 (Completed)    Other Visit Diagnoses    Mild mental slowing       Relevant Orders   CBC with Differential/Platelet (Completed)   TSH (Completed)   Lyme Ab/Western Blot Reflex (Completed)   Rocky mtn spotted fvr abs pnl(IgG+IgM) (Completed)   CMP14+EGFR (Completed)   Vitamin B12 (Completed)   Leukocytosis,  unspecified type       Relevant Orders   CBC with Differential/Platelet (Completed)   TSH (Completed)   Lyme Ab/Western Blot Reflex (Completed)   Rocky mtn spotted fvr abs pnl(IgG+IgM) (Completed)   CMP14+EGFR (Completed)   Vitamin B12 (Completed)      Leukocytosis CBC, CMP, TSH, Vit B12, Lyme, and [place] tests were ordered. Worsening depression and anxiety were considered due to stressful events in her life currently. If her labs come back negative, she  will follow up with her PCP in 2 weeks.  Follow up plan: Return in about 2 weeks (around 05/21/2018), or if symptoms worsen or fail to improve, for Follow-up with PCP in 2 weeks.  Counseling provided for all of the vaccine components Orders Placed This Encounter  Procedures  . CBC with Differential/Platelet  . TSH  . Lyme Ab/Western Blot Reflex  . Rocky mtn spotted fvr abs pnl(IgG+IgM)  . CMP14+EGFR  . Vitamin B12    Patient seen and examined with Ree Kida, PA student.  Agree with assessment and plan above.  Due to elevated leukocytosis we will do a further panel to see if we can find a reason for it and repeat the CBC as well and have her follow-up with her PCP in 2 weeks Caryl Pina, MD [place] 05/07/2018, 10:58 AM

## 2018-05-08 LAB — CMP14+EGFR
ALK PHOS: 92 IU/L (ref 39–117)
ALT: 10 IU/L (ref 0–32)
AST: 22 IU/L (ref 0–40)
Albumin/Globulin Ratio: 1.7 (ref 1.2–2.2)
Albumin: 4.3 g/dL (ref 3.5–4.7)
BILIRUBIN TOTAL: 1.1 mg/dL (ref 0.0–1.2)
BUN/Creatinine Ratio: 13 (ref 12–28)
BUN: 17 mg/dL (ref 8–27)
CHLORIDE: 101 mmol/L (ref 96–106)
CO2: 25 mmol/L (ref 20–29)
Calcium: 9.3 mg/dL (ref 8.7–10.3)
Creatinine, Ser: 1.3 mg/dL — ABNORMAL HIGH (ref 0.57–1.00)
GFR calc non Af Amer: 37 mL/min/{1.73_m2} — ABNORMAL LOW (ref >59)
GFR, EST AFRICAN AMERICAN: 42 mL/min/{1.73_m2} — AB (ref >59)
GLUCOSE: 100 mg/dL — AB (ref 65–99)
Globulin, Total: 2.5 g/dL (ref 1.5–4.5)
Potassium: 4.9 mmol/L (ref 3.5–5.2)
Sodium: 142 mmol/L (ref 134–144)
TOTAL PROTEIN: 6.8 g/dL (ref 6.0–8.5)

## 2018-05-08 LAB — TSH: TSH: 3.07 u[IU]/mL (ref 0.450–4.500)

## 2018-05-09 LAB — CBC WITH DIFFERENTIAL/PLATELET
BASOS: 1 %
Basophils Absolute: 0.1 10*3/uL (ref 0.0–0.2)
EOS (ABSOLUTE): 0.3 10*3/uL (ref 0.0–0.4)
EOS: 2 %
HEMATOCRIT: 45.6 % (ref 34.0–46.6)
HEMOGLOBIN: 15.3 g/dL (ref 11.1–15.9)
IMMATURE GRANS (ABS): 0 10*3/uL (ref 0.0–0.1)
IMMATURE GRANULOCYTES: 0 %
LYMPHS: 56 %
Lymphocytes Absolute: 7.5 10*3/uL — ABNORMAL HIGH (ref 0.7–3.1)
MCH: 30.8 pg (ref 26.6–33.0)
MCHC: 33.6 g/dL (ref 31.5–35.7)
MCV: 92 fL (ref 79–97)
Monocytes Absolute: 1.2 10*3/uL — ABNORMAL HIGH (ref 0.1–0.9)
Monocytes: 9 %
NEUTROS PCT: 32 %
Neutrophils Absolute: 4.2 10*3/uL (ref 1.4–7.0)
Platelets: 247 10*3/uL (ref 150–450)
RBC: 4.97 x10E6/uL (ref 3.77–5.28)
RDW: 13.7 % (ref 12.3–15.4)
WBC: 13.2 10*3/uL — ABNORMAL HIGH (ref 3.4–10.8)

## 2018-05-09 LAB — ROCKY MTN SPOTTED FVR ABS PNL(IGG+IGM)
RMSF IGG: NEGATIVE
RMSF IgM: 0.27 index (ref 0.00–0.89)

## 2018-05-09 LAB — LYME AB/WESTERN BLOT REFLEX: Lyme IgG/IgM Ab: <0.91 {ISR} (ref 0.00–0.90)

## 2018-05-09 LAB — VITAMIN B12: VITAMIN B 12: 334 pg/mL (ref 232–1245)

## 2018-05-23 ENCOUNTER — Ambulatory Visit (INDEPENDENT_AMBULATORY_CARE_PROVIDER_SITE_OTHER): Payer: Medicare HMO | Admitting: Family

## 2018-05-23 ENCOUNTER — Encounter: Payer: Self-pay | Admitting: Family

## 2018-05-23 VITALS — BP 129/77 | HR 60 | Temp 97.9°F | Ht 63.5 in | Wt 139.0 lb

## 2018-05-23 DIAGNOSIS — F419 Anxiety disorder, unspecified: Secondary | ICD-10-CM

## 2018-05-23 DIAGNOSIS — Z23 Encounter for immunization: Secondary | ICD-10-CM

## 2018-05-23 DIAGNOSIS — R5383 Other fatigue: Secondary | ICD-10-CM | POA: Diagnosis not present

## 2018-05-23 DIAGNOSIS — D72829 Elevated white blood cell count, unspecified: Secondary | ICD-10-CM

## 2018-05-23 NOTE — Patient Instructions (Signed)

## 2018-05-23 NOTE — Progress Notes (Signed)
   Subjective:    Patient ID: [name] Williams, female    DOB: [birth year], 84 y.o.   MRN: [id number]  Chief Complaint  Patient presents with  . two week follow up for fatigue    HPI PT presents to the office today to recheck fatigue. She was seen in the office on 05/07/18. She had a great deal of stress with her long time boyfriend passed away, caring for her adult son. She states her son is scheduled of bladder surgery 10/20 this month. She states after surgery she will not be able to care for him. She has been worried and stressed about his medical conditions.   Her WBC was elevated about a month ago, but has steady decreased since. She denies fevers, cough, dysuria, or SOB. States her fatigue is improves, but still there. She has started drinking about 4 Ensures a week.    Review of Systems  All other systems reviewed and are negative.      Objective:   Physical Exam  Constitutional: She is oriented to person, place, and time. She appears well-developed and well-nourished. No distress.  HENT:  Head: Normocephalic and atraumatic.  Right Ear: External ear normal.  Left Ear: External ear normal.  Mouth/Throat: Oropharynx is clear and moist.  Eyes: Pupils are equal, round, and reactive to light.  Neck: Normal range of motion. Neck supple. No thyromegaly present.  Cardiovascular: Normal rate, regular rhythm, normal heart sounds and intact distal pulses.  No murmur heard. Pulmonary/Chest: Effort normal and breath sounds normal. No respiratory distress. She has no wheezes.  Abdominal: Soft. Bowel sounds are normal. She exhibits no distension. There is no tenderness.  Musculoskeletal: Normal range of motion. She exhibits no edema or tenderness.  Neurological: She is alert and oriented to person, place, and time. She has normal reflexes. No cranial nerve deficit.  Skin: Skin is warm and dry.  Psychiatric: She has a normal mood and affect. Her behavior is normal. Judgment and thought content  normal.  Vitals reviewed.   BP 129/77   Pulse 60   Temp 97.9 F (36.6 C) (Oral)   Ht 5' 3.5" (1.613 m)   Wt 139 lb (63 kg)   BMI 24.24 kg/m        Assessment & Plan:  [name] Williams comes in today with chief complaint of two week follow up for fatigue   Diagnosis and orders addressed:  1. Fatigue, unspecified type - BMP8+EGFR - CBC with Differential/Platelet  2. Leukocytosis, unspecified type - BMP8+EGFR - CBC with Differential/Platelet  3. Anxiety - BMP8+EGFR - CBC with Differential/Platelet   Labs pending Health Maintenance reviewed Diet and exercise encouraged-Continue Ensure   Follow up plan: 3 months    Evelina Dun, FNP]

## 2018-05-24 LAB — CBC WITH DIFFERENTIAL/PLATELET
BASOS ABS: 0.1 10*3/uL (ref 0.0–0.2)
Basos: 1 %
EOS (ABSOLUTE): 0.2 10*3/uL (ref 0.0–0.4)
EOS: 2 %
Hematocrit: 44.9 % (ref 34.0–46.6)
Hemoglobin: 14.9 g/dL (ref 11.1–15.9)
IMMATURE GRANULOCYTES: 0 %
Immature Grans (Abs): 0 10*3/uL (ref 0.0–0.1)
Lymphocytes Absolute: 7.4 10*3/uL — ABNORMAL HIGH (ref 0.7–3.1)
Lymphs: 55 %
MCH: 30.2 pg (ref 26.6–33.0)
MCHC: 33.2 g/dL (ref 31.5–35.7)
MCV: 91 fL (ref 79–97)
MONOS ABS: 1.1 10*3/uL — AB (ref 0.1–0.9)
Monocytes: 8 %
NEUTROS PCT: 34 %
Neutrophils Absolute: 4.6 10*3/uL (ref 1.4–7.0)
PLATELETS: 221 10*3/uL (ref 150–450)
RBC: 4.93 x10E6/uL (ref 3.77–5.28)
RDW: 12.6 % (ref 12.3–15.4)
WBC: 13.5 10*3/uL — AB (ref 3.4–10.8)

## 2018-05-24 LAB — BMP8+EGFR
BUN / CREAT RATIO: 15 (ref 12–28)
BUN: 18 mg/dL (ref 8–27)
CHLORIDE: 101 mmol/L (ref 96–106)
CO2: 26 mmol/L (ref 20–29)
CREATININE: 1.21 mg/dL — AB (ref 0.57–1.00)
Calcium: 9.2 mg/dL (ref 8.7–10.3)
GFR calc non Af Amer: 40 mL/min/{1.73_m2} — ABNORMAL LOW (ref >59)
GFR, EST AFRICAN AMERICAN: 46 mL/min/{1.73_m2} — AB (ref >59)
Glucose: 94 mg/dL (ref 65–99)
POTASSIUM: 4.5 mmol/L (ref 3.5–5.2)
Sodium: 141 mmol/L (ref 134–144)

## 2018-06-12 ENCOUNTER — Encounter: Payer: Medicare HMO | Admitting: *Deleted

## 2018-06-20 ENCOUNTER — Ambulatory Visit (INDEPENDENT_AMBULATORY_CARE_PROVIDER_SITE_OTHER): Payer: Medicare HMO | Admitting: Family

## 2018-06-20 ENCOUNTER — Encounter: Payer: Self-pay | Admitting: Family

## 2018-06-20 VITALS — BP 133/84 | HR 71 | Temp 97.4°F | Ht 63.5 in | Wt 139.4 lb

## 2018-06-20 DIAGNOSIS — F3342 Major depressive disorder, recurrent, in full remission: Secondary | ICD-10-CM | POA: Diagnosis not present

## 2018-06-20 DIAGNOSIS — F411 Generalized anxiety disorder: Secondary | ICD-10-CM | POA: Diagnosis not present

## 2018-06-20 NOTE — Patient Instructions (Signed)

## 2018-06-20 NOTE — Progress Notes (Signed)
   Subjective:    Patient ID: [name] Williams, female    DOB: 1927-09-12, 84 y.o.   MRN: [id number]  Chief Complaint  Patient presents with  . discuss medication    duloxetine   Pt presents to the office today for worsening anxiety and depression. She states her older son living with her and her "friend" that died recently. Her "friend" and her would go our to dinner every night and at least 4 days for lunch. She said she's been lonely since he passed away.  Anxiety  Presents for follow-up visit. Symptoms include depressed mood, excessive worry, irritability, nervous/anxious behavior and restlessness. Symptoms occur most days. The severity of symptoms is moderate. The quality of sleep is good.    Depression         This is a chronic problem.  The current episode started more than 1 year ago.   The onset quality is sudden.   The problem occurs intermittently.  The problem has been waxing and waning since onset.  Associated symptoms include helplessness, hopelessness, irritable, restlessness and sad.     The symptoms are aggravated by family issues.  Past treatments include SSRIs - Selective serotonin reuptake inhibitors.  Compliance with treatment is good.  Past medical history includes anxiety.       Review of Systems  Constitutional: Positive for irritability.  Psychiatric/Behavioral: Positive for depression. The patient is nervous/anxious.   All other systems reviewed and are negative.      Objective:   Physical Exam  Constitutional: She is oriented to person, place, and time. She appears well-developed and well-nourished. She is irritable. No distress.  HENT:  Head: Normocephalic and atraumatic.  Right Ear: External ear normal.  Left Ear: External ear normal.  Mouth/Throat: Oropharynx is clear and moist.  Eyes: Pupils are equal, round, and reactive to light.  Neck: Normal range of motion. Neck supple. No thyromegaly present.  Cardiovascular: Normal rate, regular rhythm, normal  heart sounds and intact distal pulses.  No murmur heard. Pulmonary/Chest: Effort normal and breath sounds normal. No respiratory distress. She has no wheezes.  Abdominal: Soft. Bowel sounds are normal. She exhibits no distension. There is no tenderness.  Musculoskeletal: Normal range of motion. She exhibits no tenderness. Edema: trace BLE ankles.  Neurological: She is alert and oriented to person, place, and time. She has normal reflexes. No cranial nerve deficit.  Skin: Skin is warm and dry.  Psychiatric: She has a normal mood and affect. Her behavior is normal. Judgment and thought content normal.  Vitals reviewed.    BP 133/84   Pulse 71   Temp (!) 97.4 F (36.3 C) (Oral)   Ht 5' 3.5" (1.613 m)   Wt 139 lb 6.4 oz (63.2 kg)   BMI 24.31 kg/m      Assessment & Plan:  Guila Owensby Mastrianni comes in today with chief complaint of discuss medication (duloxetine)   Diagnosis and orders addressed:  1. Recurrent major depressive disorder, in full remission ([place]) - Ambulatory referral to Psychiatry  2. GAD (generalized anxiety disorder)  - Ambulatory referral to Psychiatry  We will continue Cymbalta since she has been doing so well. I believe this is related to her grieving related to losing her "friend". She has seen Dr. Harrington Challenger in the past and was doing very well. I will hold off on medication changes and let her follow up with [facility].    Evelina Dun, FNP

## 2018-07-21 ENCOUNTER — Encounter: Payer: Self-pay | Admitting: *Deleted

## 2018-07-21 ENCOUNTER — Ambulatory Visit (INDEPENDENT_AMBULATORY_CARE_PROVIDER_SITE_OTHER): Payer: Medicare HMO | Admitting: *Deleted

## 2018-07-21 VITALS — BP 128/77 | HR 69 | Ht 63.5 in | Wt 136.0 lb

## 2018-07-21 DIAGNOSIS — Z0000 Encounter for general adult medical examination without abnormal findings: Secondary | ICD-10-CM | POA: Diagnosis not present

## 2018-07-21 NOTE — Progress Notes (Addendum)
Subjective:   [name] Williams is a 84 y.o. female who presents for Medicare Annual (Subsequent) preventive examination.  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  She has 3 children, 5 grandchildren, and 6 great grandchildren.  She is widowed, and her best friend who she dated for 7 years passed away within the last year.  The loss of her significant other, disagreements within her family, and her youngest son that lives with her are issues that have been causing increased anxiety and depression.  She enjoys attending church, reading, and working puzzles.  Her son lives with her, and she has a pet Probation officer.  [name] Williams feels her health is unchanged from last year.  She reports no surgeries, ER visits, or hospitalizations in the past year.   Review of Systems:   All systems negative today  Cardiac Risk Factors include: advanced age (>53men, >83 women);dyslipidemia;hypertension     Objective:     Vitals: BP 128/77   Pulse 69   Ht 5' 3.5" (1.613 m)   Wt 136 lb (61.7 kg)   BMI 23.71 kg/m   Body mass index is 23.71 kg/m.  Advanced Directives 07/21/2018 06/10/2017 04/12/2016  Does Patient Have a Medical Advance Directive? Yes Yes No  Type of Advance Directive - Living will -  Does patient want to make changes to medical advance directive? Yes (MAU/Ambulatory/Procedural Areas - Information given) No - Patient declined -  Would patient like information on creating a medical advance directive? - - No - patient declined information    Tobacco Social History   Tobacco Use  Smoking Status Former Smoker  Smokeless Tobacco Never Used  Tobacco Comment   quit 2012     Counseling given: Not Answered Comment: quit 2012 Not counseled today  Clinical Intake:     Pain Score: 0-No pain                 Past Medical History:  Diagnosis Date  . Anxiety   . Depressive disorder, not elsewhere classified   .  Esophageal reflux   . Essential hypertension, benign   . Hypertr obst cardiomyop   . Other and unspecified hyperlipidemia   . Stroke ([place])   . Unspecified transient cerebral ischemia    Past Surgical History:  Procedure Laterality Date  . BLADDER REPAIR    . CERVIX SURGERY    . CHOLECYSTECTOMY    . TONSILLECTOMY    . TUBAL LIGATION     Family History  Problem Relation Age of Onset  . Breast cancer Mother   . Stomach cancer Sister   . Uterine cancer Sister   . Depression Sister   . Cancer Brother   . Alcohol abuse Son    Social History   Socioeconomic History  . Marital status: Widowed    Spouse name: Not on file  . Number of children: 3  . Years of education: Not on file  . Highest education level: 9th grade  Occupational History  . Not on file  Social Needs  . Financial resource strain: Not hard at all  . Food insecurity:    Worry: Never true    Inability: Never true  . Transportation needs:    Medical: No    Non-medical: No  Tobacco Use  . Smoking status: Former Research scientist (life sciences)  . Smokeless tobacco: Never Used  . Tobacco comment: quit 2012  Substance and Sexual Activity  .  Alcohol use: No  . Drug use: No  . Sexual activity: Not Currently  Lifestyle  . Physical activity:    Days per week: 0 days    Minutes per session: 0 min  . Stress: Rather much  Relationships  . Social connections:    Talks on phone: Once a week    Gets together: More than three times a week    Attends religious service: More than 4 times per year    Active member of club or organization: Yes    Attends meetings of clubs or organizations: More than 4 times per year    Relationship status: Widowed  Other Topics Concern  . Not on file  Social History Narrative   Patient is retired.    Patient is widowed.    Patient has 3 children.    Patient is right handed.    Pt lives with son.      Outpatient Encounter Medications as of 07/21/2018  Medication Sig  . aspirin 81 MG EC tablet Take 81  mg by mouth every other day.   . DULoxetine (CYMBALTA) 60 MG capsule Take 1 capsule (60 mg total) by mouth daily.  Marland Kitchen ENSURE (ENSURE) Take 237 mLs by mouth.  . mirtazapine (REMERON) 15 MG tablet Take 1 tablet (15 mg total) by mouth at bedtime.  Marland Kitchen MISC NATURAL PRODUCTS PO Take 1 tablet by mouth daily. Selenex [place]  . [DISCONTINUED] Multiple Vitamin tablet Take by mouth daily.   No facility-administered encounter medications on file as of 07/21/2018.     Activities of Daily Living In your present state of health, do you have any difficulty performing the following activities: 07/21/2018  Hearing? N  Vision? N  Comment Uses over the counter reading glasses  Difficulty concentrating or making decisions? N  Walking or climbing stairs? N  Dressing or bathing? N  Doing errands, shopping? N  Preparing Food and eating ? N  Using the Toilet? N  In the past six months, have you accidently leaked urine? Y  Comment Occassional urine leaking at night when getting up to use bathroom  Do you have problems with loss of bowel control? N  Managing your Medications? N  Managing your Finances? N  Housekeeping or managing your Housekeeping? N  Some recent data might be hidden    Patient Care Team: Sharion Balloon, FNP as PCP - General (Nurse Practitioner) Cloria Spring, MD as Consulting Physician [facility]) Garvin Fila, MD as Consulting Physician (Neurology)    Assessment:   This is a routine wellness examination for [name] K.  Exercise Activities and Dietary recommendations   [name] Williams states she usually eats 2 meals per day- lunch and supper.  She states she is not hungry at breakfast.   Encouraged her to try drinking an Ensure or Boost in the mornings for breakfast if she does not feel like eating.  She goes to lunch daily at the [employer] [facility] [employer].  For supper she usually makes a protein, various vegetables and bread.  She usually eats  balanced meals, but does not have a large appetite.  She describes a diet of mostly vegetables, fruits, lean proteins, and whole grains.   Current Exercise Habits: The patient does not participate in regular exercise at present  Goals    . Exercise 150 minutes per week (moderate activity)     Continue to stay active. Consider going back to the recreation department to exercise.     Marland Kitchen  Exercise 3x per week (30 min per time)     Exercise at the [facility] or recreation department with exercise groups 3 times per week.        Fall Risk Fall Risk  07/21/2018 06/20/2018 05/23/2018 05/07/2018 04/14/2018  Falls in the past year? 0 0 No No No  Number falls in past yr: - - - - -  Injury with Fall? - - - - -  Comment - - - - -  Follow up - - - - -   Is the patient's home free of loose throw rugs in walkways, pet beds, electrical cords, etc?   yes      Grab bars in the bathroom? No      Handrails on the stairs?   yes      Adequate lighting?   yes    Depression Screen PHQ 2/9 Scores 07/21/2018 06/20/2018 05/23/2018 05/07/2018  PHQ - 2 Score 6 0 0 0  PHQ- 9 Score 13 - - -  Exception Documentation - - - -  Not completed - - - -   Recommended to patient that she schedule an appointment with Dr. Harrington Challenger to discuss increased stress and depression.  Patient states she will think about it.    Cognitive Function MMSE - Mini Mental State Exam 07/21/2018 06/10/2017  Orientation to time 4 3  Orientation to Place 4 3  Registration 3 3  Attention/ Calculation 3 4  Recall 2 3  Language- name 2 objects 2 2  Language- repeat 0 1  Language- follow 3 step command 3 3  Language- read & follow direction 1 1  Write a sentence 1 1  Copy design 1 0  Total score 24 24        Immunization History  Administered Date(s) Administered  . Influenza, High Dose Seasonal PF 06/10/2017, 05/23/2018  . Influenza-Unspecified 06/01/2015  . Pneumococcal Conjugate-13 04/12/2016  . Pneumococcal Polysaccharide-23 06/10/2017     Qualifies for Shingles Vaccine? Yes, declined today  Screening Tests Health Maintenance  Topic Date Due  . TETANUS/TDAP  12/21/1946  . DEXA SCAN  01/06/2017  . INFLUENZA VACCINE  Completed  . PNA vac Low Risk Adult  Completed   Tdap declined today Dexa scan not indicated    Cancer Screenings: Lung: Low Dose CT Chest recommended if Age 63-80 years, 30 pack-year currently smoking OR have quit w/in 15years. Patient does not qualify. Breast:  Up to date on Mammogram? Not indicated Up to date of Bone Density/Dexa? Not indicated Colorectal: Not indicated  Additional Screenings:  Hepatitis C Screening:  Not indicated     Plan:     Work on your goal of exercising 3 times per week at the [facility] or recreation department for at least 30 minutes per session. Schedule an appointment with Dr. Harrington Challenger to discuss your increased stress and depression.  Review the information given on Advance Directives.  If you complete the paperwork, please bring a copy to our office to be filed in your medical record.  Follow up with [name] Dun, FNP as scheduled.    I have personally reviewed and noted the following in the patient's chart:   . Medical and social history . Use of alcohol, tobacco or illicit drugs  . Current medications and supplements . Functional ability and status . Nutritional status . Physical activity . Advanced directives . List of other physicians . Hospitalizations, surgeries, and ER visits in previous 12 months . Vitals . Screenings to include cognitive, depression, and  falls . Referrals and appointments  In addition, I have reviewed and discussed with patient certain preventive protocols, quality metrics, and best practice recommendations. A written personalized care plan for preventive services as well as general preventive health recommendations were provided to patient.     WYATT, AMY M, RN  07/21/2018  I have reviewed and agree with the above AWV documentation.    [name] Dun, FNP

## 2018-07-21 NOTE — Patient Instructions (Signed)
Please work on your goal of exercising 3 times per week at the [facility] or recreation department for at least 30 minutes per session.  We recommend making an appointment with Dr. Harrington Challenger to discuss your increased stress and depression.   Please review the information given on Advance Directives.  If you complete the paperwork, please bring a copy to our office to be filed in your medical record.   Please follow up with Evelina Dun, FNP as scheduled.  Thank you for coming in for your Annual Wellness Visit today!!  Preventive Care 82 Years and Older, Female Preventive care refers to lifestyle choices and visits with your health care provider that can promote health and wellness. What does preventive care include?  A yearly physical exam. This is also called an annual well check.  Dental exams once or twice a year.  Routine eye exams. Ask your health care provider how often you should have your eyes checked.  Personal lifestyle choices, including: ? Daily care of your teeth and gums. ? Regular physical activity. ? Eating a healthy diet. ? Avoiding tobacco and drug use. ? Limiting alcohol use. ? Practicing safe sex. ? Taking low-dose aspirin every day. ? Taking vitamin and mineral supplements as recommended by your health care provider. What happens during an annual well check? The services and screenings done by your health care provider during your annual well check will depend on your age, overall health, lifestyle risk factors, and family history of disease. Counseling Your health care provider may ask you questions about your:  Alcohol use.  Tobacco use.  Drug use.  Emotional well-being.  Home and relationship well-being.  Sexual activity.  Eating habits.  History of falls.  Memory and ability to understand (cognition).  Work and work Statistician.  Reproductive health.  Screening You may have the following tests or measurements:  Height, weight, and  BMI.  Blood pressure.  Lipid and cholesterol levels. These may be checked every 5 years, or more frequently if you are over 59 years old.  Skin check.  Lung cancer screening. You may have this screening every year starting at age 73 if you have a 30-pack-year history of smoking and currently smoke or have quit within the past 15 years.  Fecal occult blood test (FOBT) of the stool. You may have this test every year starting at age 15.  Flexible sigmoidoscopy or colonoscopy. You may have a sigmoidoscopy every 5 years or a colonoscopy every 10 years starting at age 63.  Hepatitis C blood test.  Hepatitis B blood test.  Sexually transmitted disease (STD) testing.  Diabetes screening. This is done by checking your blood sugar (glucose) after you have not eaten for a while (fasting). You may have this done every 1-3 years.  Bone density scan. This is done to screen for osteoporosis. You may have this done starting at age 2.  Mammogram. This may be done every 1-2 years. Talk to your health care provider about how often you should have regular mammograms.  Talk with your health care provider about your test results, treatment options, and if necessary, the need for more tests. Vaccines Your health care provider may recommend certain vaccines, such as:  Influenza vaccine. This is recommended every year.  Tetanus, diphtheria, and acellular pertussis (Tdap, Td) vaccine. You may need a Td booster every 10 years.  Varicella vaccine. You may need this if you have not been vaccinated.  Zoster vaccine. You may need this after age 63.  Measles,  mumps, and rubella (MMR) vaccine. You may need at least one dose of MMR if you were born in 1957 or later. You may also need a second dose.  Pneumococcal 13-valent conjugate (PCV13) vaccine. One dose is recommended after age 20.  Pneumococcal polysaccharide (PPSV23) vaccine. One dose is recommended after age 24.  Meningococcal vaccine. You may need  this if you have certain conditions.  Hepatitis A vaccine. You may need this if you have certain conditions or if you travel or work in places where you may be exposed to hepatitis A.  Hepatitis B vaccine. You may need this if you have certain conditions or if you travel or work in places where you may be exposed to hepatitis B.  Haemophilus influenzae type b (Hib) vaccine. You may need this if you have certain conditions.  Talk to your health care provider about which screenings and vaccines you need and how often you need them. This information is not intended to replace advice given to you by your health care provider. Make sure you discuss any questions you have with your health care provider. Document Released: 09/02/2015 Document Revised: 04/25/2016 Document Reviewed: 06/07/2015 Elsevier Interactive Patient Education  [employer].

## 2018-10-09 ENCOUNTER — Encounter: Payer: Self-pay | Admitting: Family

## 2018-10-09 ENCOUNTER — Ambulatory Visit (INDEPENDENT_AMBULATORY_CARE_PROVIDER_SITE_OTHER): Payer: Medicare HMO | Admitting: Family

## 2018-10-09 VITALS — BP 138/85 | HR 60 | Temp 97.0°F | Ht 63.5 in | Wt 134.6 lb

## 2018-10-09 DIAGNOSIS — F339 Major depressive disorder, recurrent, unspecified: Secondary | ICD-10-CM | POA: Diagnosis not present

## 2018-10-09 DIAGNOSIS — E785 Hyperlipidemia, unspecified: Secondary | ICD-10-CM

## 2018-10-09 DIAGNOSIS — F411 Generalized anxiety disorder: Secondary | ICD-10-CM

## 2018-10-09 DIAGNOSIS — F3342 Major depressive disorder, recurrent, in full remission: Secondary | ICD-10-CM | POA: Diagnosis not present

## 2018-10-09 DIAGNOSIS — G4700 Insomnia, unspecified: Secondary | ICD-10-CM

## 2018-10-09 DIAGNOSIS — K219 Gastro-esophageal reflux disease without esophagitis: Secondary | ICD-10-CM | POA: Diagnosis not present

## 2018-10-09 NOTE — Progress Notes (Signed)
Subjective:    Patient ID: [name] Williams, female    DOB: 1928/02/01, 85 y.o.   MRN: [id number]  Chief Complaint  Patient presents with  . Medical Management of Chronic Issues    six month recheck   Pt presents to the office today for chronic follow up. She states her older son living with her and her "friend" that died recently. Her "friend" and her would go our to dinner every night and at least 4 days for lunch. She said she's been lonely since he passed away. She is restarted going to [employer] health 4 times a week that helps greatly.  Anxiety  Presents for follow-up visit. Symptoms include decreased concentration, depressed mood, excessive worry, insomnia, irritability, nervous/anxious behavior and restlessness. Symptoms occur occasionally. The severity of symptoms is mild. The quality of sleep is good.    Depression         This is a chronic problem.  The current episode started more than 1 year ago.   The onset quality is gradual.   The problem occurs intermittently.  The problem has been waxing and waning since onset.  Associated symptoms include decreased concentration, insomnia, restlessness and sad.  Associated symptoms include no helplessness, no hopelessness, not irritable and no decreased interest.  Past treatments include SNRIs - Serotonin and norepinephrine reuptake inhibitors.  Past medical history includes anxiety.   Hyperlipidemia  This is a chronic problem. The current episode started more than 1 year ago. The problem is controlled. Recent lipid tests were reviewed and are normal. Current antihyperlipidemic treatment includes diet change. The current treatment provides mild improvement of lipids.  Gastroesophageal Reflux  She reports no belching, no coughing or no heartburn. This is a chronic problem. The current episode started more than 1 year ago. The problem occurs occasionally. The problem has been waxing and waning. The symptoms are aggravated by certain foods. She has  tried an antacid for the symptoms. The treatment provided moderate relief.  Insomnia  Primary symptoms: difficulty falling asleep, frequent awakening.  The current episode started more than one year. The onset quality is gradual. The problem occurs intermittently. The problem has been waxing and waning since onset. PMH includes: depression.      Review of Systems  Constitutional: Positive for irritability.  Respiratory: Negative for cough.   Gastrointestinal: Negative for heartburn.  Psychiatric/Behavioral: Positive for decreased concentration and depression. The patient is nervous/anxious and has insomnia.   All other systems reviewed and are negative.      Objective:   Physical Exam Vitals signs reviewed.  Constitutional:      General: She is not irritable.She is not in acute distress.    Appearance: She is well-developed.  HENT:     Head: Normocephalic and atraumatic.     Right Ear: Tympanic membrane normal.     Left Ear: Tympanic membrane normal.  Eyes:     Pupils: Pupils are equal, round, and reactive to light.  Neck:     Musculoskeletal: Normal range of motion and neck supple.     Thyroid: No thyromegaly.  Cardiovascular:     Rate and Rhythm: Normal rate and regular rhythm.     Heart sounds: Normal heart sounds. No murmur.  Pulmonary:     Effort: Pulmonary effort is normal. No respiratory distress.     Breath sounds: Normal breath sounds. No wheezing.  Abdominal:     General: Bowel sounds are normal. There is no distension.     Palpations: Abdomen is  soft.     Tenderness: There is no abdominal tenderness.  Musculoskeletal: Normal range of motion.        General: No tenderness.     Right lower leg: Edema (trace) present.     Left lower leg: Edema (trace) present.  Skin:    General: Skin is warm and dry.  Neurological:     Mental Status: She is alert and oriented to person, place, and time.     Cranial Nerves: No cranial nerve deficit.     Deep Tendon Reflexes:  Reflexes are normal and symmetric.  Psychiatric:        Behavior: Behavior normal.        Thought Content: Thought content normal.        Judgment: Judgment normal.       BP 138/85   Pulse 60   Temp (!) 97 F (36.1 C) (Oral)   Ht 5' 3.5" (1.613 m)   Wt 134 lb 9.6 oz (61.1 kg)   BMI 23.47 kg/m      Assessment & Plan:  [name] Williams comes in today with chief complaint of Medical Management of Chronic Issues (six month recheck)   Diagnosis and orders addressed:  1. Gastroesophageal reflux disease, esophagitis presence not specified - CMP14+EGFR - CBC with Differential/Platelet  2. Hyperlipidemia, unspecified hyperlipidemia type - CMP14+EGFR - CBC with Differential/Platelet  3. Recurrent major depressive disorder, in full remission ([name] Williams) - CMP14+EGFR - CBC with Differential/Platelet  4. GAD (generalized anxiety disorder) - CMP14+EGFR - CBC with Differential/Platelet  5. Insomnia, unspecified type - CMP14+EGFR - CBC with Differential/Platelet  6. Episode of recurrent major depressive disorder, unspecified depression episode severity ([name] Williams)  - CMP14+EGFR - CBC with Differential/Platelet   Labs pending Health Maintenance reviewed Diet and exercise encouraged  Follow up plan: 6 months    Evelina Dun, FNP

## 2018-10-09 NOTE — Patient Instructions (Signed)
Osteoporosis  Osteoporosis is thinning and loss of density in your bones. Osteoporosis makes bones more brittle and fragile and more likely to break (fracture). Over time, osteoporosis can cause your bones to become so weak that they fracture after a minor fall. Bones in the hip, wrist, and spine are most likely to fracture due to osteoporosis. What are the causes? The exact cause of this condition is not known. What increases the risk? You may be at greater risk for osteoporosis if you:  Have a family history of the condition.  Have poor nutrition.  Use steroid medicines, such as prednisone.  Are female.  Are age 83 or older.  Smoke or have a history of smoking.  Are not physically active (are sedentary).  Are white (Caucasian) or of Asian descent.  Have a small body frame.  Take certain medicines, such as antiseizure medicines. What are the signs or symptoms? A fracture might be the first sign of osteoporosis, especially if the fracture results from a fall or injury that usually would not cause a bone to break. Other signs and symptoms include:  Pain in the neck or low back.  Stooped posture.  Loss of height. How is this diagnosed? This condition may be diagnosed based on:  Your medical history.  A physical exam.  A bone mineral density test, also called a DXA or DEXA test (dual-energy X-ray absorptiometry test). This test uses X-rays to measure the amount of minerals in your bones. How is this treated? The goal of treatment is to strengthen your bones and lower your risk for a fracture. Treatment may involve:  Making lifestyle changes, such as: ? Including foods with more calcium and vitamin D in your diet. ? Doing weight-bearing and muscle-strengthening exercises. ? Stopping tobacco use. ? Limiting alcohol intake.  Taking medicine to slow the process of bone loss or to increase bone density.  Taking daily supplements of calcium and vitamin D.  Taking  hormone replacement medicines, such as estrogen for women and testosterone for men.  Monitoring your levels of calcium and vitamin D. Follow these instructions at home:  Activity  Exercise as told by your health care provider. Ask your health care provider what exercises and activities are safe for you. You should do: ? Exercises that make you work against gravity (weight-bearing exercises), such as tai chi, yoga, or walking. ? Exercises to strengthen muscles, such as lifting weights. Lifestyle  Limit alcohol intake to no more than 1 drink a day for nonpregnant women and 2 drinks a day for men. One drink equals 12 oz of beer, 5 oz of wine, or 1 oz of hard liquor.  Do not use any products that contain nicotine or tobacco, such as cigarettes and e-cigarettes. If you need help quitting, ask your health care provider. Preventing falls  Use devices to help you move around (mobility aids) as needed, such as canes, walkers, scooters, or crutches.  Keep rooms well-lit and clutter-free.  Remove tripping hazards from walkways, including cords and throw rugs.  Install grab bars in bathrooms and safety rails on stairs.  Use rubber mats in the bathroom and other areas that are often wet or slippery.  Wear closed-toe shoes that fit well and support your feet. Wear shoes that have rubber soles or low heels.  Review your medicines with your health care provider. Some medicines can cause dizziness or changes in blood pressure, which can increase your risk of falling. General instructions  Include calcium and vitamin D in  your diet. Calcium is important for bone health, and vitamin D helps your body to absorb calcium. Good sources of calcium and vitamin D include: ? Certain fatty fish, such as salmon and tuna. ? Products that have calcium and vitamin D added to them (fortified products), such as fortified cereals. ? Egg yolks. ? Cheese. ? Liver.  Take over-the-counter and prescription medicines  only as told by your health care provider.  Keep all follow-up visits as told by your health care provider. This is important. Contact a health care provider if:  You have never been screened for osteoporosis and you are: ? A woman who is age 83 or older. ? A man who is age 21 or older. Get help right away if:  You fall or injure yourself. Summary  Osteoporosis is thinning and loss of density in your bones. This makes bones more brittle and fragile and more likely to break (fracture),even with minor falls.  The goal of treatment is to strengthen your bones and reduce your risk for a fracture.  Include calcium and vitamin D in your diet. Calcium is important for bone health, and vitamin D helps your body to absorb calcium.  Talk with your health care provider about screening for osteoporosis if you are a woman who is age 83 or older, or a man who is age 83 or older. This information is not intended to replace advice given to you by your health care provider. Make sure you discuss any questions you have with your health care provider. Document Released: 05/16/2005 Document Revised: 05/31/2017 Document Reviewed: 05/31/2017 Elsevier Interactive Patient Education  2019 [employer].

## 2018-10-16 ENCOUNTER — Ambulatory Visit: Payer: Medicare HMO | Admitting: Family

## 2019-01-05 DIAGNOSIS — S20461A Insect bite (nonvenomous) of right back wall of thorax, initial encounter: Secondary | ICD-10-CM | POA: Diagnosis not present

## 2019-01-25 ENCOUNTER — Other Ambulatory Visit: Payer: Self-pay

## 2019-01-25 ENCOUNTER — Emergency Department (HOSPITAL_COMMUNITY)
Admission: EM | Admit: 2019-01-25 | Discharge: 2019-01-25 | Disposition: A | Discharge status: Home / Self Care | Payer: Medicare HMO | Attending: Emergency Medicine | Admitting: Emergency Medicine

## 2019-01-25 ENCOUNTER — Encounter (HOSPITAL_COMMUNITY): Payer: Self-pay | Admitting: *Deleted

## 2019-01-25 DIAGNOSIS — Z8673 Personal history of transient ischemic attack (TIA), and cerebral infarction without residual deficits: Secondary | ICD-10-CM | POA: Diagnosis not present

## 2019-01-25 DIAGNOSIS — Y929 Unspecified place or not applicable: Secondary | ICD-10-CM | POA: Insufficient documentation

## 2019-01-25 DIAGNOSIS — I10 Essential (primary) hypertension: Secondary | ICD-10-CM | POA: Insufficient documentation

## 2019-01-25 DIAGNOSIS — W0110XA Fall on same level from slipping, tripping and stumbling with subsequent striking against unspecified object, initial encounter: Secondary | ICD-10-CM | POA: Insufficient documentation

## 2019-01-25 DIAGNOSIS — Z79899 Other long term (current) drug therapy: Secondary | ICD-10-CM | POA: Insufficient documentation

## 2019-01-25 DIAGNOSIS — Y999 Unspecified external cause status: Secondary | ICD-10-CM | POA: Insufficient documentation

## 2019-01-25 DIAGNOSIS — S0181XA Laceration without foreign body of other part of head, initial encounter: Secondary | ICD-10-CM | POA: Diagnosis not present

## 2019-01-25 DIAGNOSIS — Z7982 Long term (current) use of aspirin: Secondary | ICD-10-CM | POA: Insufficient documentation

## 2019-01-25 DIAGNOSIS — Z87891 Personal history of nicotine dependence: Secondary | ICD-10-CM | POA: Diagnosis not present

## 2019-01-25 DIAGNOSIS — S41112A Laceration without foreign body of left upper arm, initial encounter: Secondary | ICD-10-CM

## 2019-01-25 DIAGNOSIS — Y9389 Activity, other specified: Secondary | ICD-10-CM | POA: Diagnosis not present

## 2019-01-25 DIAGNOSIS — W19XXXA Unspecified fall, initial encounter: Secondary | ICD-10-CM

## 2019-01-25 MED ORDER — BACITRACIN ZINC 500 UNIT/GM EX OINT
TOPICAL_OINTMENT | CUTANEOUS | Status: AC
  Filled 2019-01-25: qty 0.9

## 2019-01-25 NOTE — ED Triage Notes (Signed)
Pt c/o falling outside in her driveway twice today while trying to pick up a microwave to put in her car. Pt reports she lost her balance when trying to lift it and fell over. Pt then attempted to lift it again and fell again. Pt reports dizziness after the fall, but none before. Denies LOC. Pt has skin tears to left arm and forehead.

## 2019-01-25 NOTE — Discharge Instructions (Addendum)
Keep original dressing on for 2 to 3 days; then can change.  You may need to soak it off with warm salt water.  Nurse applied butterfly strips to your right forehead.  Try to keep them on until they fall off.

## 2019-01-28 ENCOUNTER — Encounter: Payer: Self-pay | Admitting: Family Medicine

## 2019-01-28 ENCOUNTER — Other Ambulatory Visit: Payer: Self-pay

## 2019-01-28 ENCOUNTER — Ambulatory Visit (INDEPENDENT_AMBULATORY_CARE_PROVIDER_SITE_OTHER): Payer: Medicare HMO | Admitting: Family Medicine

## 2019-01-28 ENCOUNTER — Ambulatory Visit: Payer: Medicare HMO | Admitting: Family Medicine

## 2019-01-28 VITALS — BP 122/87 | HR 107 | Temp 97.2°F | Ht 64.0 in | Wt 133.0 lb

## 2019-01-28 DIAGNOSIS — G3281 Cerebellar ataxia in diseases classified elsewhere: Secondary | ICD-10-CM

## 2019-01-28 DIAGNOSIS — S41112A Laceration without foreign body of left upper arm, initial encounter: Secondary | ICD-10-CM | POA: Diagnosis not present

## 2019-01-28 NOTE — Progress Notes (Signed)
Subjective:  Patient ID: [name] Williams, female    DOB: 29-Jul-1928  Age: 85 y.o. MRN: [id number]  CC: Dizziness (Seen at [facility]. Had fall)   HPI [name] Williams presents for falling caused by off balance dizziness. Seen in E.D. but not imaged. Still having some light headedness and loss of balance. Denies vertigo. Some hearing loss. She also has a wound at the left arm - skin tear covered by  gauze. Sore, not painful. Can move the arm and hand.   Depression screen [facility] 2/9 01/28/2019 10/09/2018 07/21/2018  Decreased Interest 0 0 3  Down, Depressed, Hopeless 0 0 3  PHQ - 2 Score 0 0 6  Altered sleeping - - 2  Tired, decreased energy - - 2  Change in appetite - - 1  Feeling bad or failure about yourself  - - 0  Trouble concentrating - - 2  Moving slowly or fidgety/restless - - 0  Suicidal thoughts - - 0  PHQ-9 Score - - 13  Difficult doing work/chores - - Somewhat difficult    History [name] Williams has a past medical history of Anxiety, Depressive disorder, not elsewhere classified, Esophageal reflux, Essential hypertension, benign, Hypertr obst cardiomyop, Other and unspecified hyperlipidemia, Stroke ([place]), and Unspecified transient cerebral ischemia.   She has a past surgical history that includes Cervix surgery; Bladder repair; Cholecystectomy; Tonsillectomy; and Tubal ligation.   Her family history includes Alcohol abuse in her son; Breast cancer in her mother; Cancer in her brother; Depression in her sister; Stomach cancer in her sister; Uterine cancer in her sister.She reports that she has quit smoking. She has never used smokeless tobacco. She reports that she does not drink alcohol or use drugs.    ROS Review of Systems  Constitutional: Negative.   HENT: Negative.   Eyes: Negative for visual disturbance.  Respiratory: Negative for shortness of breath.   Cardiovascular: Negative for chest pain.  Gastrointestinal: Negative for abdominal pain.  Musculoskeletal: Positive for  arthralgias.  Skin: Positive for wound.  Neurological: Positive for dizziness.    Objective:  BP 122/87   Pulse (!) 107   Temp (!) 97.2 F (36.2 C) (Oral)   Ht 5\' 4"  (1.626 m)   Wt 133 lb (60.3 kg)   BMI 22.83 kg/m   BP Readings from Last 3 Encounters:  01/30/19 137/85  01/28/19 122/87  01/25/19 (!) 150/93    Wt Readings from Last 3 Encounters:  01/30/19 133 lb (60.3 kg)  01/28/19 133 lb (60.3 kg)  01/25/19 135 lb (61.2 kg)     Physical Exam Constitutional:      General: She is not in acute distress.    Appearance: She is well-developed.  Cardiovascular:     Rate and Rhythm: Normal rate and regular rhythm.  Pulmonary:     Breath sounds: Normal breath sounds.  Abdominal:     Palpations: Abdomen is soft.     Tenderness: There is no abdominal tenderness.  Skin:    General: Skin is warm and dry.     Findings: Lesion (grade 2 skin tear, left upper arm.) present.  Neurological:     Mental Status: She is alert and oriented to person, place, and time.  Psychiatric:        Mood and Affect: Mood normal.       Assessment & Plan:   [name] Williams was seen today for dizziness.  Diagnoses and all orders for this visit:  Cerebellar ataxia in diseases classified elsewhere ([place])  Skin tear of left upper arm without complication, initial encounter       I am having [name] Williams maintain her aspirin, Ensure, mirtazapine, MISC NATURAL PRODUCTS PO, and ASHWAGANDHA PO.  Allergies as of 01/28/2019      Reactions   Latex    Per pt 11-10-14 she do not remember being allergic to this.../or      Medication List       Accurate as of January 28, 2019 11:59 PM. If you have any questions, ask your nurse or doctor.        ASHWAGANDHA PO Take by mouth.   aspirin 81 MG EC tablet Take 81 mg by mouth every other day.   Ensure Take 237 mLs by mouth.   mirtazapine 15 MG tablet Commonly known as: Remeron Take 1 tablet (15 mg total) by mouth at bedtime.   MISC NATURAL  PRODUCTS PO Take 1 tablet by mouth daily. Selenex [place]        Follow-up: Return in about 2 days (around 01/30/2019).  Claretta Fraise, M.D.

## 2019-01-29 ENCOUNTER — Telehealth: Payer: Self-pay | Admitting: Family

## 2019-01-29 ENCOUNTER — Other Ambulatory Visit: Payer: Self-pay

## 2019-01-29 NOTE — ED Provider Notes (Signed)
[facility] EMERGENCY DEPARTMENT Provider Note   CSN: [id number] Arrival date & time: 01/25/19  1319    History   Chief Complaint Chief Complaint  Patient presents with  . Fall    HPI [name] Williams is a 85 y.o. female.     Accidental trip and fall today x2 while attempting to pick up a microwave oven.  Unit was too heavy for her.  She now complains of skin tears on her left arm and a small laceration on the right forehead.  No true syncope, loss of consciousness, neurological deficits, chest pain, dyspnea, fever, dysuria, prodromal illnesses.  Severity is moderate.     Past Medical History:  Diagnosis Date  . Anxiety   . Depressive disorder, not elsewhere classified   . Esophageal reflux   . Essential hypertension, benign   . Hypertr obst cardiomyop   . Other and unspecified hyperlipidemia   . Stroke ([place])   . Unspecified transient cerebral ischemia     Patient Active Problem List   Diagnosis Date Noted  . Insomnia 01/07/2015  . Osteoporosis 01/07/2015  . GAD (generalized anxiety disorder) 11/24/2014  . Idiopathic insomnia 04/15/2014  . Dermatitis, purulent 04/08/2014  . Memory loss 01/28/2014  . Fatigue 01/07/2014  . Cerebral vascular accident ([place]) 12/03/2013  . Clinical depression 12/03/2013  . BRADYCARDIA 03/15/2010  . SYNCOPE AND COLLAPSE 03/15/2010  . Depression 09/14/2009  . Transient cerebral ischemia 09/14/2009  . GERD 09/14/2009  . Hyperlipidemia 07/14/2008  . HYPERTENSION, BENIGN 07/14/2008  . Hypertrophic obstructive cardiomyopathy(425.11) 07/14/2008    Past Surgical History:  Procedure Laterality Date  . BLADDER REPAIR    . CERVIX SURGERY    . CHOLECYSTECTOMY    . TONSILLECTOMY    . TUBAL LIGATION       OB History   No obstetric history on file.      Home Medications    Prior to Admission medications   Medication Sig Start Date End Date Taking? Authorizing Provider  ASHWAGANDHA PO Take by mouth.    [provider]   aspirin 81 MG EC tablet Take 81 mg by mouth every other day.     [provider]  ENSURE (ENSURE) Take 237 mLs by mouth.    [provider]  mirtazapine (REMERON) 15 MG tablet Take 1 tablet (15 mg total) by mouth at bedtime. 04/14/18 04/14/19  Sharion Balloon, FNP  MISC NATURAL PRODUCTS PO Take 1 tablet by mouth daily. Selenex [place]    [provider]    Family History Family History  Problem Relation Age of Onset  . Breast cancer Mother   . Stomach cancer Sister   . Uterine cancer Sister   . Depression Sister   . Cancer Brother   . Alcohol abuse Son     Social History Social History   Tobacco Use  . Smoking status: Former Research scientist (life sciences)  . Smokeless tobacco: Never Used  . Tobacco comment: quit 2012  Substance Use Topics  . Alcohol use: No  . Drug use: No     Allergies   Latex   Review of Systems Review of Systems  All other systems reviewed and are negative.    Physical Exam Updated Vital Signs BP (!) 150/93   Pulse 91   Temp 97.6 F (36.4 C) (Oral)   Resp 18   Ht 5\' 4"  (1.626 m)   Wt 61.2 kg   SpO2 95%   BMI 23.17 kg/m   Physical Exam Vitals signs and  nursing note reviewed.  Constitutional:      Appearance: She is well-developed.  HENT:     Head: Normocephalic.     Comments: 1.0 cm laceration right forehead Eyes:     Conjunctiva/sclera: Conjunctivae normal.  Neck:     Musculoskeletal: Neck supple.  Cardiovascular:     Rate and Rhythm: Normal rate and regular rhythm.  Pulmonary:     Effort: Pulmonary effort is normal.     Breath sounds: Normal breath sounds.  Abdominal:     General: Bowel sounds are normal.     Palpations: Abdomen is soft.  Musculoskeletal: Normal range of motion.  Skin:    General: Skin is warm and dry.     Comments: 2 areas of skin tears on left lower extremity.  No suturing necessary.  Neurological:     Mental Status: She is alert and oriented to person, place, and time.  Psychiatric:        Behavior:  Behavior normal.      ED Treatments / Results  Labs (all labs ordered are listed, but only abnormal results are displayed) Labs Reviewed - No data to display  EKG None  Radiology No results found.  Procedures .Marland KitchenLaceration Repair  Date/Time: 01/29/2019 8:55 AM Performed by: Nat Christen, MD Authorized by: Nat Christen, MD   Consent:    Consent obtained:  Verbal   Consent given by:  Patient Anesthesia (see MAR for exact dosages):    Anesthesia method:  None Laceration details:    Location:  Face   Wound length (cm): 1.0. Repair type:    Repair type:  Simple Exploration:    Contaminated: no   Treatment:    Area cleansed with:  Saline   Visualized foreign bodies/material removed: no   Skin repair:    Repair method:  Steri-Strips Approximation:    Approximation:  Loose Post-procedure details:    Dressing:  Sterile dressing   (including critical care time)  Medications Ordered in ED Medications - No data to display   Initial Impression / Assessment and Plan / ED Course  I have reviewed the triage vital signs and the nursing notes.  Pertinent labs & imaging results that were available during my care of the patient were reviewed by me and considered in my medical decision making (see chart for details).        Patient presents with accidental trip and fall while trying to lift a heavy microwave.  No neurological deficits.  No evidence of syncope.  Skin tears on left lower extremity dressed by RN.  Steri-Strips to right forehead.  Final Clinical Impressions(s) / ED Diagnoses   Final diagnoses:  Fall, initial encounter  Skin tear of left upper extremity  Facial laceration, initial encounter    ED Discharge Orders    None       Nat Christen, MD 01/29/19 [phone]

## 2019-01-30 ENCOUNTER — Ambulatory Visit (INDEPENDENT_AMBULATORY_CARE_PROVIDER_SITE_OTHER): Payer: Medicare HMO | Admitting: Family Medicine

## 2019-01-30 VITALS — BP 137/85 | HR 93 | Temp 97.5°F | Ht 64.0 in | Wt 133.0 lb

## 2019-01-30 DIAGNOSIS — Z23 Encounter for immunization: Secondary | ICD-10-CM

## 2019-01-30 DIAGNOSIS — S41112D Laceration without foreign body of left upper arm, subsequent encounter: Secondary | ICD-10-CM

## 2019-01-30 NOTE — Addendum Note (Signed)
Addended byCarrolyn Leigh on: 01/30/2019 01:53 PM   Modules accepted: Orders

## 2019-01-30 NOTE — Progress Notes (Signed)
Subjective: CC: Skin tears PCP: Sharion Balloon, FNP UXN:ATFTDD [name] Williams is a 85 y.o. female presenting to clinic today for:  1.  Skin tears Patient sustained 2 skin tears along the left upper extremity last week after she fell.  These were repaired in the emergency department with Steri-Strips and she was discharged home.  She follows up today and notes that the lesions seem to be healing well.  She notes some tightness in the left hand when she moves it but has full function of the hand.  Denies any purulence, fevers, significant pain.  She is here for wound check and new bandages.  She has not received any tetanus shot at the emergency department or here in office.   ROS: Per HPI  Allergies  Allergen Reactions  . Latex     Per pt 11-10-14 she do not remember being allergic to this.../or   Past Medical History:  Diagnosis Date  . Anxiety   . Depressive disorder, not elsewhere classified   . Esophageal reflux   . Essential hypertension, benign   . Hypertr obst cardiomyop   . Other and unspecified hyperlipidemia   . Stroke ([place])   . Unspecified transient cerebral ischemia     Current Outpatient Medications:  .  ASHWAGANDHA PO, Take by mouth., Disp: , Rfl:  .  aspirin 81 MG EC tablet, Take 81 mg by mouth every other day. , Disp: , Rfl:  .  ENSURE (ENSURE), Take 237 mLs by mouth., Disp: , Rfl:  .  mirtazapine (REMERON) 15 MG tablet, Take 1 tablet (15 mg total) by mouth at bedtime., Disp: 90 tablet, Rfl: 2 .  MISC NATURAL PRODUCTS PO, Take 1 tablet by mouth daily. Selenex [place], Disp: , Rfl:  Social History   Socioeconomic History  . Marital status: Widowed    Spouse name: Not on file  . Number of children: 3  . Years of education: Not on file  . Highest education level: 9th grade  Occupational History  . Not on file  Social Needs  . Financial resource strain: Not hard at all  . Food insecurity    Worry: Never true    Inability: Never true  . Transportation needs   Medical: No    Non-medical: No  Tobacco Use  . Smoking status: Former Research scientist (life sciences)  . Smokeless tobacco: Never Used  . Tobacco comment: quit 2012  Substance and Sexual Activity  . Alcohol use: No  . Drug use: No  . Sexual activity: Not Currently  Lifestyle  . Physical activity    Days per week: 0 days    Minutes per session: 0 min  . Stress: Rather much  Relationships  . Social Herbalist on phone: Once a week    Gets together: More than three times a week    Attends religious service: More than 4 times per year    Active member of club or organization: Yes    Attends meetings of clubs or organizations: More than 4 times per year    Relationship status: Widowed  . Intimate partner violence    Fear of current or ex partner: No    Emotionally abused: No    Physically abused: No    Forced sexual activity: No  Other Topics Concern  . Not on file  Social History Narrative   Patient is retired.    Patient is widowed.    Patient has 3 children.    Patient is right handed.  Pt lives with son.     Family History  Problem Relation Age of Onset  . Breast cancer Mother   . Stomach cancer Sister   . Uterine cancer Sister   . Depression Sister   . Cancer Brother   . Alcohol abuse Son     Objective: Office vital signs reviewed. BP 137/85   Pulse 93   Temp (!) 97.5 F (36.4 C) (Oral)   Physical Examination:  General: Awake, alert, elderly, frail.  No acute distress Skin: Patient has a large curvilinear skin tear that appears to be healing on the medial/ventral aspect of the left upper extremity.  No evidence of secondary infection.  She has a deeper appearing skin tear appreciated along the ventral aspect of the left hand between digits 1 and 2.  There is an area of dehiscence along this area.  No active bleeding or exudates.  No evidence of secondary infection. MSK: Patient has full active range of motion of her fingers of the left hand.  Good grip.  Assessment/  Plan: 85 y.o. female   1. Skin tear of left upper arm without complication, subsequent encounter No evidence of secondary infection.  Skin tears seem to be healing.  The one on the left dorsal aspect of the hand certainly warrants ongoing monitoring.  I have asked that she follow-up in 4 days for repeat check and redressing of wound if needed at that time.  She is aware of signs and symptoms of infection.  She was given a tetanus booster during today's visit.  2. Need for tetanus booster As above   No orders of the defined types were placed in this encounter.  No orders of the defined types were placed in this encounter.    [name] Norlander, DO [place] [phone]

## 2019-02-03 ENCOUNTER — Encounter: Payer: Self-pay | Admitting: Family Medicine

## 2019-02-03 ENCOUNTER — Ambulatory Visit (INDEPENDENT_AMBULATORY_CARE_PROVIDER_SITE_OTHER): Payer: Medicare HMO | Admitting: Family Medicine

## 2019-02-03 ENCOUNTER — Other Ambulatory Visit: Payer: Self-pay

## 2019-02-03 VITALS — BP 130/80 | HR 88 | Temp 98.7°F | Ht 64.0 in | Wt 131.0 lb

## 2019-02-03 DIAGNOSIS — S61412D Laceration without foreign body of left hand, subsequent encounter: Secondary | ICD-10-CM

## 2019-02-03 NOTE — Progress Notes (Signed)
Subjective: CC: Follow-up skin tear PCP: [name] Balloon, FNP [name] Williams is a 85 y.o. female presenting to clinic today for:  1.  Skin tear Patient here for interval follow-up on the skin tears that she sustained a couple of weeks ago of her left upper extremity.  Overall she is been doing well.  No fevers.  She still try to regain her strength after the tick bite.  She reports that she has been trying to do hand exercises to maintain mobility of her fingers.  Here for dressing change   ROS: Per HPI  Allergies  Allergen Reactions  . Latex     Per pt 11-10-14 she do not remember being allergic to this.../or   Past Medical History:  Diagnosis Date  . Anxiety   . Depressive disorder, not elsewhere classified   . Esophageal reflux   . Essential hypertension, benign   . Hypertr obst cardiomyop   . Other and unspecified hyperlipidemia   . Stroke ([place])   . Unspecified transient cerebral ischemia     Current Outpatient Medications:  .  ASHWAGANDHA PO, Take by mouth., Disp: , Rfl:  .  aspirin 81 MG EC tablet, Take 81 mg by mouth every other day. , Disp: , Rfl:  .  ENSURE (ENSURE), Take 237 mLs by mouth., Disp: , Rfl:  .  mirtazapine (REMERON) 15 MG tablet, Take 1 tablet (15 mg total) by mouth at bedtime., Disp: 90 tablet, Rfl: 2 .  MISC NATURAL PRODUCTS PO, Take 1 tablet by mouth daily. Selenex [place], Disp: , Rfl:  Social History   Socioeconomic History  . Marital status: Widowed    Spouse name: Not on file  . Number of children: 3  . Years of education: Not on file  . Highest education level: 9th grade  Occupational History  . Not on file  Social Needs  . Financial resource strain: Not hard at all  . Food insecurity    Worry: Never true    Inability: Never true  . Transportation needs    Medical: No    Non-medical: No  Tobacco Use  . Smoking status: Former Research scientist (life sciences)  . Smokeless tobacco: Never Used  . Tobacco comment: quit 2012  Substance and Sexual Activity  .  Alcohol use: No  . Drug use: No  . Sexual activity: Not Currently  Lifestyle  . Physical activity    Days per week: 0 days    Minutes per session: 0 min  . Stress: Rather much  Relationships  . Social Herbalist on phone: Once a week    Gets together: More than three times a week    Attends religious service: More than 4 times per year    Active member of club or organization: Yes    Attends meetings of clubs or organizations: More than 4 times per year    Relationship status: Widowed  . Intimate partner violence    Fear of current or ex partner: No    Emotionally abused: No    Physically abused: No    Forced sexual activity: No  Other Topics Concern  . Not on file  Social History Narrative   Patient is retired.    Patient is widowed.    Patient has 3 children.    Patient is right handed.    Pt lives with son.     Family History  Problem Relation Age of Onset  . Breast cancer Mother   . Stomach  cancer Sister   . Uterine cancer Sister   . Depression Sister   . Cancer Brother   . Alcohol abuse Son     Objective: Office vital signs reviewed. BP 130/80   Pulse 88   Temp 98.7 F (37.1 C) (Oral)   Ht 5\' 4"  (1.626 m)   Wt 131 lb (59.4 kg)   BMI 22.49 kg/m   Physical Examination:  General: Awake, alert, well nourished, elderly female.  No acute distress Skin: Tear of left upper forearm healing nicely.  Skin tear of dorsal aspect of the left hand with mild ongoing serous drainage but no evidence of secondary infection.  It does appear to be healing ever so slowly.  Minimal surrounding erythema.  Assessment/ Plan: 85 y.o. female   1. Skin tear of left hand without complication, subsequent encounter Both skin tears appear to be healing.  I have gone ahead and applied another round of Vaseline bandage to the left hand.  Okay to continue routine skin care for the laceration along the left upper extremity.  We discussed signs and symptoms of infection and she  voiced understanding.    No orders of the defined types were placed in this encounter.  No orders of the defined types were placed in this encounter.    Janora Norlander, DO [place] [phone]

## 2019-02-03 NOTE — Patient Instructions (Signed)
Apply a small amount of vaseline to the skin tears.  You may remove the bandage Gina applied in 3 days.  If you develop any fever, chills, or pus from the skin tears, call me immediately.   Skin Tear Care A skin tear is a wound in which the top layer of skin peels off. To repair the skin, your doctor may use:  Tape.  Skin adhesive strips. A bandage (dressing) may also be placed over the tape or skin adhesive strips. How to care for your skin tear  Clean the wound as told by your doctor. You may be told to keep the wound dry for the first few days. If you are told to clean the wound: ? Wash the wound with mild soap and water or a salt-water (saline) solution. ? Rinse the wound with water to remove all soap. ? Do not rub the wound dry. Let the wound air dry.  Change any dressings as told by your doctor. This includes changing the dressing if it gets wet, gets dirty, or starts to smell bad.  Do not scratch or pick at the wound.  Protect the injured area until it has healed.  Check your wound every day for signs of infection. Check for: ? More redness, swelling, or pain. ? More fluid or blood. ? Warmth. ? Pus or a bad smell. Medicines   Take over-the-counter and prescription medicines only as told by your doctor.  If you were prescribed an antibiotic medicine, take or apply it as told by your doctor. Do not stop using the antibiotic even if your condition gets better. General instructions  Keep the bandage dry as told by your doctor.  Do not take baths, swim, or do anything that puts your wound underwater until your doctor says it is okay.  Keep all follow-up visits as told by your doctor. This is important. Contact a doctor if:  You have more redness, swelling, or pain around your wound.  You have more fluid or blood coming from your wound.  Your wound feels warm to the touch.  You have pus or a bad smell coming from your wound. Get help right away if:  You have a red  streak that goes away from the skin tear.  You have a fever and chills and your symptoms suddenly get worse. This information is not intended to replace advice given to you by your health care provider. Make sure you discuss any questions you have with your health care provider. Document Released: 05/15/2008 Document Revised: 04/01/2016 Document Reviewed: 06/27/2015 Elsevier Interactive Patient Education  [employer].

## 2019-02-06 ENCOUNTER — Other Ambulatory Visit: Payer: Self-pay

## 2019-02-06 DIAGNOSIS — G3281 Cerebellar ataxia in diseases classified elsewhere: Secondary | ICD-10-CM

## 2019-02-13 ENCOUNTER — Other Ambulatory Visit: Payer: Self-pay

## 2019-02-13 ENCOUNTER — Ambulatory Visit (HOSPITAL_COMMUNITY)
Admission: RE | Admit: 2019-02-13 | Discharge: 2019-02-13 | Disposition: A | Discharge status: Home / Self Care | Payer: Medicare HMO | Source: Ambulatory Visit | Attending: Family Medicine | Admitting: Family Medicine

## 2019-02-13 DIAGNOSIS — G3281 Cerebellar ataxia in diseases classified elsewhere: Secondary | ICD-10-CM | POA: Diagnosis not present

## 2019-02-13 DIAGNOSIS — R270 Ataxia, unspecified: Secondary | ICD-10-CM | POA: Diagnosis not present

## 2019-02-13 DIAGNOSIS — R410 Disorientation, unspecified: Secondary | ICD-10-CM | POA: Diagnosis not present

## 2019-02-13 LAB — POCT I-STAT CREATININE: Creatinine, Ser: 1.2 mg/dL — ABNORMAL HIGH (ref 0.44–1.00)

## 2019-02-13 MED ORDER — GADOBUTROL 1 MMOL/ML IV SOLN
6.0000 mL | Freq: Once | INTRAVENOUS | Status: AC | PRN
  Administered 2019-02-13: 6 mL via INTRAVENOUS

## 2019-02-23 ENCOUNTER — Encounter: Payer: Self-pay | Admitting: Family

## 2019-02-23 ENCOUNTER — Ambulatory Visit (INDEPENDENT_AMBULATORY_CARE_PROVIDER_SITE_OTHER): Payer: Medicare HMO | Admitting: Family

## 2019-02-23 ENCOUNTER — Other Ambulatory Visit: Payer: Self-pay

## 2019-02-23 DIAGNOSIS — S30860D Insect bite (nonvenomous) of lower back and pelvis, subsequent encounter: Secondary | ICD-10-CM | POA: Diagnosis not present

## 2019-02-23 DIAGNOSIS — R531 Weakness: Secondary | ICD-10-CM | POA: Diagnosis not present

## 2019-02-23 DIAGNOSIS — W57XXXD Bitten or stung by nonvenomous insect and other nonvenomous arthropods, subsequent encounter: Secondary | ICD-10-CM

## 2019-02-23 DIAGNOSIS — R5383 Other fatigue: Secondary | ICD-10-CM | POA: Diagnosis not present

## 2019-02-23 DIAGNOSIS — R2689 Other abnormalities of gait and mobility: Secondary | ICD-10-CM

## 2019-02-23 NOTE — Progress Notes (Signed)
Virtual Visit via telephone Note  I connected with [name] Williams on 02/23/19 at 2:39 pm by telephone and verified that I am speaking with the correct person using two identifiers. [name] Williams is currently located at home and no one is currently with her during visit. The provider, Evelina Dun, FNP is located in their office at time of visit.  I discussed the limitations, risks, security and privacy concerns of performing an evaluation and management service by telephone and the availability of in person appointments. I also discussed with the patient that there may be a patient responsible charge related to this service. The patient expressed understanding and agreed to proceed.   History and Present Illness:  HPI Pt calls the office today with complaints of weakness. She states she had a tick bite on her back in May. She states since then she has had fatigue, weakness, decreased appetite , itching, and her balance has been off. She was given doxycycline for 21 days. She states that "drove her crazy" and since completing that medications she feels like she has been weaker.   She fell on 01/25/19 and hit her head. She thinks that was because of the doxycyline. She had a negative MRI on 02/13/19. She states I'm not sure what is wrong, but something is wrong.   She denies any fever, SOB, cough, chest pain, headaches, or edema.    Review of Systems  Constitutional: Positive for malaise/fatigue. Negative for chills, diaphoresis and fever.  Respiratory: Negative for cough, sputum production and shortness of breath.   Cardiovascular: Negative for chest pain, palpitations, orthopnea, claudication and leg swelling.  Musculoskeletal: Negative for myalgias.  Neurological: Positive for weakness. Negative for headaches.  All other systems reviewed and are negative.    Observations/Objective: No SOB or distress noted  Assessment and Plan: [name] Williams comes in today with chief complaint of  No chief complaint on file.   Diagnosis and orders addressed:  1. Weakness - CMP14+EGFR; Future - Anemia Profile B; Future - TSH; Future  2. Tick bite of back, subsequent encounter - CMP14+EGFR; Future - Anemia Profile B; Future - TSH; Future - Alpha-Gal Panel; Future - Lyme Ab/Western Blot Reflex; Future - Rocky mtn spotted fvr abs pnl(IgG+IgM); Future  3. Other fatigue - CMP14+EGFR; Future - Anemia Profile B; Future - TSH; Future  4. Loss of balance - CMP14+EGFR; Future - Anemia Profile B; Future - TSH; Future   Labs pending Force fluids -Pt to report any new fever, joint pain, or rash -Wear protective clothing while outside- Long sleeves and long pants -Put insect repellent on all exposed skin and along clothing -Take a shower as soon as possible after being outside Fall preventions discussed RTO if symptoms worsen or do not improve    I discussed the assessment and treatment plan with the patient. The patient was provided an opportunity to ask questions and all were answered. The patient agreed with the plan and demonstrated an understanding of the instructions.   The patient was advised to call back or seek an in-person evaluation if the symptoms worsen or if the condition fails to improve as anticipated.  The above assessment and management plan was discussed with the patient. The patient verbalized understanding of and has agreed to the management plan. Patient is aware to call the clinic if symptoms persist or worsen. Patient is aware when to return to the clinic for a follow-up visit. Patient educated on when it is appropriate to go to  the emergency department.   Time call ended:  2:53  pm   I provided 14 minutes of non-face-to-face time during this encounter.    Evelina Dun, FNP

## 2019-02-23 NOTE — Addendum Note (Signed)
Addended by: Liliane Bade on: 02/23/2019 03:13 PM   Modules accepted: Orders

## 2019-02-26 ENCOUNTER — Other Ambulatory Visit: Payer: Self-pay | Admitting: Family

## 2019-02-26 DIAGNOSIS — D72820 Lymphocytosis (symptomatic): Secondary | ICD-10-CM

## 2019-02-27 LAB — ALPHA-GAL PANEL
Alpha Gal IgE*: 5.58 kU/L — ABNORMAL HIGH (ref <0.10)
Beef (Bos spp) IgE: 3.12 kU/L — ABNORMAL HIGH (ref <0.35)
Class Interpretation: 1
Class Interpretation: 2
Class Interpretation: 2
Lamb/Mutton (Ovis spp) IgE: 0.45 kU/L — ABNORMAL HIGH (ref <0.35)
Pork (Sus spp) IgE: 2.12 kU/L — ABNORMAL HIGH (ref <0.35)

## 2019-02-27 LAB — ANEMIA PROFILE B
Basophils Absolute: 0.1 10*3/uL (ref 0.0–0.2)
Basos: 1 %
EOS (ABSOLUTE): 0.3 10*3/uL (ref 0.0–0.4)
Eos: 2 %
Ferritin: 205 ng/mL — ABNORMAL HIGH (ref 15–150)
Folate: >20 ng/mL (ref >3.0)
Hematocrit: 47.8 % — ABNORMAL HIGH (ref 34.0–46.6)
Hemoglobin: 15.8 g/dL (ref 11.1–15.9)
Immature Grans (Abs): 0 10*3/uL (ref 0.0–0.1)
Immature Granulocytes: 0 %
Iron Saturation: 27 % (ref 15–55)
Iron: 66 ug/dL (ref 27–139)
Lymphocytes Absolute: 7.7 10*3/uL — ABNORMAL HIGH (ref 0.7–3.1)
Lymphs: 62 %
MCH: 30.5 pg (ref 26.6–33.0)
MCHC: 33.1 g/dL (ref 31.5–35.7)
MCV: 92 fL (ref 79–97)
Monocytes Absolute: 0.9 10*3/uL (ref 0.1–0.9)
Monocytes: 7 %
Neutrophils Absolute: 3.6 10*3/uL (ref 1.4–7.0)
Neutrophils: 28 %
Platelets: 217 10*3/uL (ref 150–450)
RBC: 5.18 x10E6/uL (ref 3.77–5.28)
RDW: 12.5 % (ref 11.7–15.4)
Retic Ct Pct: 1 % (ref 0.6–2.6)
Total Iron Binding Capacity: 246 ug/dL — ABNORMAL LOW (ref 250–450)
UIBC: 180 ug/dL (ref 118–369)
Vitamin B-12: 377 pg/mL (ref 232–1245)
WBC: 12.6 10*3/uL — ABNORMAL HIGH (ref 3.4–10.8)

## 2019-02-27 LAB — CMP14+EGFR
ALT: 8 IU/L (ref 0–32)
AST: 21 IU/L (ref 0–40)
Albumin/Globulin Ratio: 1.8 (ref 1.2–2.2)
Albumin: 4 g/dL (ref 3.6–4.6)
Alkaline Phosphatase: 82 IU/L (ref 39–117)
BUN/Creatinine Ratio: 11 — ABNORMAL LOW (ref 12–28)
BUN: 14 mg/dL (ref 8–27)
Bilirubin Total: 0.7 mg/dL (ref 0.0–1.2)
CO2: 27 mmol/L (ref 20–29)
Calcium: 9.8 mg/dL (ref 8.7–10.3)
Chloride: 103 mmol/L (ref 96–106)
Creatinine, Ser: 1.32 mg/dL — ABNORMAL HIGH (ref 0.57–1.00)
GFR calc Af Amer: 41 mL/min/{1.73_m2} — ABNORMAL LOW (ref >59)
GFR calc non Af Amer: 36 mL/min/{1.73_m2} — ABNORMAL LOW (ref >59)
Globulin, Total: 2.2 g/dL (ref 1.5–4.5)
Glucose: 108 mg/dL — ABNORMAL HIGH (ref 65–99)
Potassium: 5.2 mmol/L (ref 3.5–5.2)
Sodium: 143 mmol/L (ref 134–144)
Total Protein: 6.2 g/dL (ref 6.0–8.5)

## 2019-02-27 LAB — TSH: TSH: 2.57 u[IU]/mL (ref 0.450–4.500)

## 2019-02-27 LAB — ROCKY MTN SPOTTED FVR ABS PNL(IGG+IGM)
RMSF IgG: NEGATIVE
RMSF IgM: 0.27 index (ref 0.00–0.89)

## 2019-02-27 LAB — LYME AB/WESTERN BLOT REFLEX
LYME DISEASE AB, QUANT, IGM: <0.8 index (ref 0.00–0.79)
Lyme IgG/IgM Ab: <0.91 {ISR} (ref 0.00–0.90)

## 2019-03-09 ENCOUNTER — Other Ambulatory Visit: Payer: Self-pay | Admitting: Family

## 2019-03-09 DIAGNOSIS — F411 Generalized anxiety disorder: Secondary | ICD-10-CM

## 2019-03-12 ENCOUNTER — Encounter (HOSPITAL_COMMUNITY): Payer: Self-pay | Admitting: Hematology

## 2019-03-12 ENCOUNTER — Other Ambulatory Visit: Payer: Self-pay

## 2019-03-12 ENCOUNTER — Inpatient Hospital Stay (HOSPITAL_COMMUNITY): Payer: Medicare HMO | Attending: Hematology | Admitting: Hematology

## 2019-03-12 ENCOUNTER — Inpatient Hospital Stay (HOSPITAL_COMMUNITY): Payer: Medicare HMO

## 2019-03-12 VITALS — BP 124/72 | HR 96 | Temp 97.7°F | Resp 18 | Wt 118.0 lb

## 2019-03-12 DIAGNOSIS — Z79899 Other long term (current) drug therapy: Secondary | ICD-10-CM | POA: Diagnosis not present

## 2019-03-12 DIAGNOSIS — N189 Chronic kidney disease, unspecified: Secondary | ICD-10-CM | POA: Diagnosis not present

## 2019-03-12 DIAGNOSIS — Z809 Family history of malignant neoplasm, unspecified: Secondary | ICD-10-CM | POA: Insufficient documentation

## 2019-03-12 DIAGNOSIS — Z87891 Personal history of nicotine dependence: Secondary | ICD-10-CM | POA: Diagnosis not present

## 2019-03-12 DIAGNOSIS — Z9181 History of falling: Secondary | ICD-10-CM | POA: Insufficient documentation

## 2019-03-12 DIAGNOSIS — R42 Dizziness and giddiness: Secondary | ICD-10-CM | POA: Diagnosis not present

## 2019-03-12 DIAGNOSIS — Z818 Family history of other mental and behavioral disorders: Secondary | ICD-10-CM | POA: Insufficient documentation

## 2019-03-12 DIAGNOSIS — C9110 Chronic lymphocytic leukemia of B-cell type not having achieved remission: Secondary | ICD-10-CM | POA: Diagnosis not present

## 2019-03-12 DIAGNOSIS — D72829 Elevated white blood cell count, unspecified: Secondary | ICD-10-CM | POA: Insufficient documentation

## 2019-03-12 DIAGNOSIS — Z803 Family history of malignant neoplasm of breast: Secondary | ICD-10-CM | POA: Diagnosis not present

## 2019-03-12 DIAGNOSIS — Z8049 Family history of malignant neoplasm of other genital organs: Secondary | ICD-10-CM | POA: Diagnosis not present

## 2019-03-12 DIAGNOSIS — D72820 Lymphocytosis (symptomatic): Secondary | ICD-10-CM | POA: Diagnosis not present

## 2019-03-12 DIAGNOSIS — Z811 Family history of alcohol abuse and dependence: Secondary | ICD-10-CM | POA: Diagnosis not present

## 2019-03-12 DIAGNOSIS — R634 Abnormal weight loss: Secondary | ICD-10-CM | POA: Insufficient documentation

## 2019-03-12 DIAGNOSIS — Z800 Family history of malignant neoplasm of digestive organs: Secondary | ICD-10-CM | POA: Diagnosis not present

## 2019-03-12 DIAGNOSIS — F329 Major depressive disorder, single episode, unspecified: Secondary | ICD-10-CM | POA: Insufficient documentation

## 2019-03-12 LAB — CBC WITH DIFFERENTIAL/PLATELET
Abs Immature Granulocytes: 0.02 10*3/uL (ref 0.00–0.07)
Basophils Absolute: 0.1 10*3/uL (ref 0.0–0.1)
Basophils Relative: 1 %
Eosinophils Absolute: 0.5 10*3/uL (ref 0.0–0.5)
Eosinophils Relative: 3 %
HCT: 51.3 % — ABNORMAL HIGH (ref 36.0–46.0)
Hemoglobin: 16.3 g/dL — ABNORMAL HIGH (ref 12.0–15.0)
Immature Granulocytes: 0 %
Lymphocytes Relative: 51 %
Lymphs Abs: 7.1 10*3/uL — ABNORMAL HIGH (ref 0.7–4.0)
MCH: 30.2 pg (ref 26.0–34.0)
MCHC: 31.8 g/dL (ref 30.0–36.0)
MCV: 95 fL (ref 80.0–100.0)
Monocytes Absolute: 1 10*3/uL (ref 0.1–1.0)
Monocytes Relative: 7 %
Neutro Abs: 5.2 10*3/uL (ref 1.7–7.7)
Neutrophils Relative %: 38 %
Platelets: 226 10*3/uL (ref 150–400)
RBC: 5.4 MIL/uL — ABNORMAL HIGH (ref 3.87–5.11)
RDW: 13.3 % (ref 11.5–15.5)
WBC: 13.8 10*3/uL — ABNORMAL HIGH (ref 4.0–10.5)
nRBC: 0 % (ref 0.0–0.2)

## 2019-03-12 LAB — LACTATE DEHYDROGENASE: LDH: 144 U/L (ref 98–192)

## 2019-03-12 LAB — RETICULOCYTES
Immature Retic Fract: 3.6 % (ref 2.3–15.9)
RBC.: 5.4 MIL/uL — ABNORMAL HIGH (ref 3.87–5.11)
Retic Count, Absolute: 31.3 10*3/uL (ref 19.0–186.0)
Retic Ct Pct: 0.6 % (ref 0.4–3.1)

## 2019-03-12 NOTE — Progress Notes (Signed)
CONSULT NOTE  Patient Care Team: Sharion Balloon, FNP as PCP - General (Nurse Practitioner) Cloria Spring, MD as Consulting Physician [facility]) Garvin Fila, MD as Consulting Physician (Neurology)  CHIEF COMPLAINTS/PURPOSE OF CONSULTATION:  Lymphocytic leukocytosis.  HISTORY OF PRESENTING ILLNESS:  [name] Williams 85 y.o. female is seen in consultation today at the request of Evelina Dun, FNP for elevated white count.  CBC on 02/23/2019 shows elevated white count of 12.6 with a normal hemoglobin and platelet count.  MCV was 92.  Absolute lymphocyte count was elevated at 7700.  Patient denies any fevers or night sweats.  However she reported weight loss of 18 pounds in the last 2 months.  She attributes it to decreased appetite.  She reports that she started going downhill after they discovered a tick in her right upper back on 01/05/2019.  She was treated with 21 days of doxycycline.  After that she has progressively lost her appetite and energy levels.  She has fallen a few times.  She also complained of dizziness.  I have reviewed her medications.  She only takes Remeron 15 mg at bedtime as needed sleep.  An MRI of the brain with and without contrast showed no acute or significant intracranial abnormality identified.  Lyme disease panel on 02/23/2019 was negative.  Alpha gal panel showed low positivity for beef and pork IgE.  RMSF antibody panel was negative.  She is seen along with her daughter today.  Denies any nausea, vomiting.  She has noted erythematous itching rash on the right anterior chest for the last 1 week.  Denies any new onset pains.  Denies any bleeding per rectum or melena.  No change in bowel habits noted.  Last colonoscopy was October 2003.  She used to do factory work prior to retirement.  She lives at home with her son.  She is to work on tobacco forms and quit smoking cigarettes 8 years ago.  She smoked occasionally.  Family history significant for mother with breast  cancer.  No other malignancies reported.  MEDICAL HISTORY:  Past Medical History:  Diagnosis Date  . Anxiety   . Depressive disorder, not elsewhere classified   . Esophageal reflux   . Essential hypertension, benign   . Hypertr obst cardiomyop   . Other and unspecified hyperlipidemia   . Stroke ([place])   . Unspecified transient cerebral ischemia     SURGICAL HISTORY: Past Surgical History:  Procedure Laterality Date  . BLADDER REPAIR    . CERVIX SURGERY    . CHOLECYSTECTOMY    . TONSILLECTOMY    . TUBAL LIGATION      SOCIAL HISTORY: Social History   Socioeconomic History  . Marital status: Widowed    Spouse name: Not on file  . Number of children: 3  . Years of education: Not on file  . Highest education level: 9th grade  Occupational History  . Not on file  Social Needs  . Financial resource strain: Not hard at all  . Food insecurity    Worry: Never true    Inability: Never true  . Transportation needs    Medical: No    Non-medical: No  Tobacco Use  . Smoking status: Former Research scientist (life sciences)  . Smokeless tobacco: Never Used  . Tobacco comment: quit 2012  Substance and Sexual Activity  . Alcohol use: No  . Drug use: No  . Sexual activity: Not Currently  Lifestyle  . Physical activity    Days per  week: 0 days    Minutes per session: 0 min  . Stress: Rather much  Relationships  . Social Herbalist on phone: Once a week    Gets together: More than three times a week    Attends religious service: More than 4 times per year    Active member of club or organization: Yes    Attends meetings of clubs or organizations: More than 4 times per year    Relationship status: Widowed  . Intimate partner violence    Fear of current or ex partner: No    Emotionally abused: No    Physically abused: No    Forced sexual activity: No  Other Topics Concern  . Not on file  Social History Narrative   Patient is retired.    Patient is widowed.    Patient has 3 children.     Patient is right handed.    Pt lives with son.      FAMILY HISTORY: Family History  Problem Relation Age of Onset  . Breast cancer Mother   . Stomach cancer Sister   . Uterine cancer Sister   . Depression Sister   . Cancer Brother   . Alcohol abuse Son     ALLERGIES:  is allergic to latex.  MEDICATIONS:  Current Outpatient Medications  Medication Sig Dispense Refill  . ASHWAGANDHA PO Take by mouth.    Marland Kitchen aspirin 81 MG EC tablet Take 81 mg by mouth every other day.     . ENSURE (ENSURE) Take 237 mLs by mouth.    . mirtazapine (REMERON) 15 MG tablet TAKE 1 TABLET BY MOUTH EVERYDAY AT BEDTIME 90 tablet 2  . MISC NATURAL PRODUCTS PO Take 1 tablet by mouth daily. Selenex GSH     No current facility-administered medications for this visit.     REVIEW OF SYSTEMS:   Constitutional: Denies fevers, chills or abnormal night sweats.  18 pound weight loss.  Decreasing energy levels. Eyes: Denies blurriness of vision, double vision or watery eyes Ears, nose, mouth, throat, and face: Denies mucositis or sore throat Respiratory: Denies cough, dyspnea or wheezes Cardiovascular: Denies palpitation, chest discomfort or lower extremity swelling Gastrointestinal:  Denies nausea, heartburn or change in bowel habits.  Occasional diarrhea.  Also reports decreased appetite. Skin: Denies abnormal skin rashes Lymphatics: Denies new lymphadenopathy or easy bruising Neurological:Denies numbness, tingling or new weaknesses Behavioral/Psych: Mood is stable, no new changes  All other systems were reviewed with the patient and are negative.  PHYSICAL EXAMINATION: ECOG PERFORMANCE STATUS: 1 - Symptomatic but completely ambulatory  Vitals:   03/12/19 1257  BP: 124/72  Pulse: 96  Resp: 18  Temp: 97.7 F (36.5 C)  SpO2: 97%   Filed Weights   03/12/19 1257  Weight: 118 lb (53.5 kg)    GENERAL:alert, no distress and comfortable SKIN: skin color, texture, turgor are normal, no rashes or  significant lesions EYES: normal, conjunctiva are pink and non-injected, sclera clear OROPHARYNX:no exudate, no erythema and lips, buccal mucosa, and tongue normal  NECK: supple, thyroid normal size, non-tender, without nodularity LYMPH:  no palpable lymphadenopathy in the cervical, axillary or inguinal LUNGS: clear to auscultation and percussion with normal breathing effort HEART: regular rate & rhythm and no murmurs and no lower extremity edema ABDOMEN:abdomen soft, non-tender and normal bowel sounds.  Abdominal exam shows aortic pulsations in the epigastric region. Musculoskeletal:no cyanosis of digits and no clubbing  PSYCH: alert & oriented x 3 with fluent speech  NEURO: no focal motor/sensory deficits  LABORATORY DATA:  I have reviewed the data as listed Recent Results (from the past 2160 hour(s))  I-STAT creatinine     Status: Abnormal   Collection Time: 02/13/19  3:30 PM  Result Value Ref Range   Creatinine, Ser 1.20 (H) 0.44 - 1.00 mg/dL  CMP14+EGFR     Status: Abnormal   Collection Time: 02/23/19  3:13 PM  Result Value Ref Range   Glucose 108 (H) 65 - 99 mg/dL   BUN 14 8 - 27 mg/dL   Creatinine, Ser 1.32 (H) 0.57 - 1.00 mg/dL   GFR calc non Af Amer 36 (L) >59 mL/min/1.73   GFR calc Af Amer 41 (L) >59 mL/min/1.73   BUN/Creatinine Ratio 11 (L) 12 - 28   Sodium 143 134 - 144 mmol/L   Potassium 5.2 3.5 - 5.2 mmol/L   Chloride 103 96 - 106 mmol/L   CO2 27 20 - 29 mmol/L   Calcium 9.8 8.7 - 10.3 mg/dL   Total Protein 6.2 6.0 - 8.5 g/dL   Albumin 4.0 3.6 - 4.6 g/dL   Globulin, Total 2.2 1.5 - 4.5 g/dL   Albumin/Globulin Ratio 1.8 1.2 - 2.2   Bilirubin Total 0.7 0.0 - 1.2 mg/dL   Alkaline Phosphatase 82 39 - 117 IU/L   AST 21 0 - 40 IU/L   ALT 8 0 - 32 IU/L  Rocky mtn spotted fvr abs pnl(IgG+IgM)     Status: None   Collection Time: 02/23/19  3:13 PM  Result Value Ref Range   RMSF IgG Negative Negative   RMSF IgM 0.27 0.00 - 0.89 index    Comment:                                   Negative        <0.90                                  Equivocal 0.90 - 1.10                                  Positive        >1.10   Lyme Ab/Western Blot Reflex     Status: None   Collection Time: 02/23/19  3:13 PM  Result Value Ref Range   Lyme IgG/IgM Ab <0.91 0.00 - 0.90 ISR    Comment:                                 Negative         <0.91                                 Equivocal  0.91 - 1.09                                 Positive         >1.09    LYME DISEASE AB, QUANT, IGM <0.80 0.00 - 0.79 index    Comment:  Negative         <0.80                                 Equivocal  0.80 - 1.19                                 Positive         >1.19  IgM levels may peak at 3-6 weeks post infection, then  gradually decline.   Alpha-Gal Panel     Status: Abnormal   Collection Time: 02/23/19  3:13 PM  Result Value Ref Range   Beef (Bos spp) IgE 3.12 (H) <0.35 kU/L   Class Interpretation 2    Lamb/Mutton (Ovis spp) IgE 0.45 (H) <0.35 kU/L   Class Interpretation 1    Pork (Sus spp) IgE 2.12 (H) <0.35 kU/L   Class Interpretation 2     Comment: The test method is the [employer] ImmunoCAP allergen-specific IgE system. CLASS INTERPRETATION   <0.10 kU/L= 0, Negative; 0.10 - 0.34 kU/L= 0/1, Equivocal/Borderline; 0.35 - 0.69  kU/L=1, Low Positive; 0.70 - 3.49 kU/L=2, Moderate Positive;  3.50  - 17.49 kU/L=3, High Positive; 17.50 - 49.99 kU/L= 4, Very High Positive; 50.00 - 99.99 kU/L= 5, Very High Positive;   >99.99 kU/L=6, Very High Positive *This test was developed and its performance characteristics determined by [employer]. It has not been cleared or approved by the U.S. Food and Drug Administration.    Alpha Gal IgE* 5.58 (H) <0.10 kU/L    Comment: Previous reports ([facility] [phone]) have demonstrated that patients with IgE antibodies to galactose-a-1,3-galactose are at risk for delayed anaphylaxis, angioedema, or urticaria  following consumption of beef, pork, or lamb.   TSH     Status: None   Collection Time: 02/23/19  3:13 PM  Result Value Ref Range   TSH 2.570 0.450 - 4.500 uIU/mL  Anemia Profile B     Status: Abnormal   Collection Time: 02/23/19  3:13 PM  Result Value Ref Range   Total Iron Binding Capacity 246 (L) 250 - 450 ug/dL   UIBC 180 118 - 369 ug/dL   Iron 66 27 - 139 ug/dL   Iron Saturation 27 15 - 55 %   Ferritin 205 (H) 15 - 150 ng/mL   Vitamin B-12 377 232 - 1,245 pg/mL   Folate >20.0 >3.0 ng/mL    Comment: A serum folate concentration of less than 3.1 ng/mL is considered to represent clinical deficiency.    WBC 12.6 (H) 3.4 - 10.8 x10E3/uL   RBC 5.18 3.77 - 5.28 x10E6/uL   Hemoglobin 15.8 11.1 - 15.9 g/dL   Hematocrit 47.8 (H) 34.0 - 46.6 %   MCV 92 79 - 97 fL   MCH 30.5 26.6 - 33.0 pg   MCHC 33.1 31.5 - 35.7 g/dL   RDW 12.5 11.7 - 15.4 %   Platelets 217 150 - 450 x10E3/uL   Neutrophils 28 Not Estab. %   Lymphs 62 Not Estab. %   Monocytes 7 Not Estab. %   Eos 2 Not Estab. %   Basos 1 Not Estab. %   Neutrophils Absolute 3.6 1.4 - 7.0 x10E3/uL   Lymphocytes Absolute 7.7 (H) 0.7 - 3.1 x10E3/uL   Monocytes Absolute 0.9 0.1 - 0.9 x10E3/uL   EOS (ABSOLUTE) 0.3 0.0 - 0.4 x10E3/uL   Basophils Absolute  0.1 0.0 - 0.2 x10E3/uL   Immature Granulocytes 0 Not Estab. %   Immature Grans (Abs) 0.0 0.0 - 0.1 x10E3/uL   Retic Ct Pct 1.0 0.6 - 2.6 %    RADIOGRAPHIC STUDIES: I have personally reviewed the radiological images as listed and agreed with the findings in the report. Mr Jeri Cos Wo Contrast  Result Date: 02/13/2019 CLINICAL DATA:  Ataxia. Weakness and confusion. Fell 4 weeks ago in driveway. EXAM: MRI HEAD WITHOUT AND WITH CONTRAST TECHNIQUE: Multiplanar, multiecho pulse sequences of the brain and surrounding structures were obtained without and with intravenous contrast. CONTRAST:  6 mL Gadavist COMPARISON:  03/31/2010 FINDINGS: Most sequences suffer from at least mild motion  artifact with some being moderately to severely degraded. Brain: There is no evidence of acute infarct, intracranial hemorrhage, mass, midline shift, or extra-axial fluid collection. Scattered punctate foci of T2 hyperintensity in the cerebral white matter are nonspecific but may reflect minimal chronic small vessel ischemic disease, not considered abnormal for age. A partially empty sella is again incidentally noted. No abnormal brain parenchymal or meningeal enhancement is identified. Mild cerebral atrophy is not greater than expected for age. No focal cerebellar insult is identified. Vascular: Major intracranial vascular flow voids are preserved. Skull and upper cervical spine: No destructive skull lesion. Sinuses/Orbits: Bilateral cataract extraction. Tiny right maxillary sinus mucous retention cyst. Clear mastoid air cells. Other: None. IMPRESSION: Motion degraded examination. No acute or significant intracranial abnormality identified. Electronically Signed   By: Logan Bores M.D.   On: 02/13/2019 17:11    ASSESSMENT & PLAN:  Leukocytosis 1.  Lymphocytic leukocytosis: - Patient referred to [place] by Evelina Dun for evaluation of leukocytosis. - Recent CBC showed white count of 12.6 with elevated lymphocytes. -I have reviewed her labs and her white count has been elevated since 2009. -She reportedly had a tick taken off from her right back on 01/05/2019.  She was treated with 21 days of doxycycline.  Since then she has become very weak and has fallen a few times.  Tick disease panel was ordered. -Denies any fevers or night sweats.  She reported weight loss of 18 pounds due to loss of appetite in the last 2 months. - Differential diagnosis includes B-cell lymphoproliferative disorders including CLL. - We will repeat a CBC today and check LDH and reticulocyte count.  We will send a flow cytometry to evaluate for any lymphoproliferative disorders. -Physical examination today did not reveal any palpable  lymphadenopathy or splenomegaly.  2.  Weight loss: -Reported 18 pound weight loss in the last 2 months since the tick bite.  She reported extreme fatigue because of it. -Lyme disease and RMSF antibody panel was negative.  Alpha gal panel showed low positivity for beef and pork IgE. -She is only eating bites of food.  She is drinking about 1 can of boost daily.  She smoked cigarettes are not of, less than 1 cigarette/day.  She quit 8 years ago completely. -Her only medication is Remeron 15 mg which she takes on and off. -Because of her sudden onset weight loss, I have recommended CT chest abdomen and pelvis.  3.  Health maintenance: -Last colonoscopy was in October 2003 which showed normal rectum and diverticular disease.  4.  CKD: -He had mild CKD since February 2016.     All questions were answered. The patient knows to call the clinic with any problems, questions or concerns.      Derek Jack, MD 03/12/19 1:47 PM

## 2019-03-12 NOTE — Assessment & Plan Note (Addendum)
1.  Lymphocytic leukocytosis: - Patient referred to [place] by Evelina Dun for evaluation of leukocytosis. - Recent CBC showed white count of 12.6 with elevated lymphocytes. -I have reviewed her labs and her white count has been elevated since 2009. -She reportedly had a tick taken off from her right back on 01/05/2019.  She was treated with 21 days of doxycycline.  Since then she has become very weak and has fallen a few times.  Tick disease panel was ordered. -Denies any fevers or night sweats.  She reported weight loss of 18 pounds due to loss of appetite in the last 2 months. - Differential diagnosis includes B-cell lymphoproliferative disorders including CLL. - We will repeat a CBC today and check LDH and reticulocyte count.  We will send a flow cytometry to evaluate for any lymphoproliferative disorders. -Physical examination today did not reveal any palpable lymphadenopathy or splenomegaly.  2.  Weight loss: -Reported 18 pound weight loss in the last 2 months since the tick bite.  She reported extreme fatigue because of it. -Lyme disease and RMSF antibody panel was negative.  Alpha gal panel showed low positivity for beef and pork IgE. -She is only eating bites of food.  She is drinking about 1 can of boost daily.  She smoked cigarettes are not of, less than 1 cigarette/day.  She quit 8 years ago completely. -Her only medication is Remeron 15 mg which she takes on and off. -Because of her sudden onset weight loss, I have recommended CT chest abdomen and pelvis.  3.  Health maintenance: -Last colonoscopy was in October 2003 which showed normal rectum and diverticular disease.  4.  CKD: -He had mild CKD since February 2016.

## 2019-03-12 NOTE — Patient Instructions (Addendum)
[place] at [facility] Discharge Instructions  You were seen today by Dr. Delton Coombes. He went over your history, family history and how you've been feeling lately, as well as your recent lab results. He will have blood work drawn today. He will schedule you for CT scans to further evaluate you. He will see you back after your scans for follow up.  Start taking your mirtazapine every night to help with your appetite.  Thank you for choosing [place] at [facility] to provide your oncology and hematology care.  To afford each patient quality time with our provider, please arrive at least 15 minutes before your scheduled appointment time.   If you have a lab appointment with the [facility] please come in thru the  Main Entrance and check in at the main information desk  You need to re-schedule your appointment should you arrive 10 or more minutes late.  We strive to give you quality time with our providers, and arriving late affects you and other patients whose appointments are after yours.  Also, if you no show three or more times for appointments you may be dismissed from the clinic at the providers discretion.     Again, thank you for choosing [facility].  Our hope is that these requests will decrease the amount of time that you wait before being seen by our physicians.       _____________________________________________________________  Should you have questions after your visit to [facility], please contact our office at [phone] between the hours of 8:00 a.m. and 4:30 p.m.  Voicemails left after 4:00 p.m. will not be returned until the following business day.  For prescription refill requests, have your pharmacy contact our office and allow 72 hours.    Cancer Center Support Programs:   > Cancer Support Group  2nd Tuesday of the month 1pm-2pm, Journey Room

## 2019-03-19 ENCOUNTER — Other Ambulatory Visit: Payer: Self-pay

## 2019-03-19 ENCOUNTER — Telehealth: Payer: Self-pay | Admitting: Family

## 2019-03-19 ENCOUNTER — Encounter: Payer: Self-pay | Admitting: Family

## 2019-03-19 ENCOUNTER — Ambulatory Visit (INDEPENDENT_AMBULATORY_CARE_PROVIDER_SITE_OTHER): Payer: Medicare HMO | Admitting: Family

## 2019-03-19 VITALS — BP 132/84 | HR 93 | Temp 96.6°F | Ht 64.0 in | Wt 119.0 lb

## 2019-03-19 DIAGNOSIS — K219 Gastro-esophageal reflux disease without esophagitis: Secondary | ICD-10-CM

## 2019-03-19 DIAGNOSIS — G4700 Insomnia, unspecified: Secondary | ICD-10-CM

## 2019-03-19 DIAGNOSIS — F3342 Major depressive disorder, recurrent, in full remission: Secondary | ICD-10-CM

## 2019-03-19 DIAGNOSIS — F411 Generalized anxiety disorder: Secondary | ICD-10-CM

## 2019-03-19 DIAGNOSIS — D72820 Lymphocytosis (symptomatic): Secondary | ICD-10-CM

## 2019-03-19 DIAGNOSIS — R630 Anorexia: Secondary | ICD-10-CM | POA: Diagnosis not present

## 2019-03-19 DIAGNOSIS — R634 Abnormal weight loss: Secondary | ICD-10-CM | POA: Diagnosis not present

## 2019-03-19 DIAGNOSIS — E785 Hyperlipidemia, unspecified: Secondary | ICD-10-CM | POA: Diagnosis not present

## 2019-03-19 LAB — BMP8+EGFR
BUN/Creatinine Ratio: 20 (ref 12–28)
BUN: 24 mg/dL (ref 8–27)
CO2: 24 mmol/L (ref 20–29)
Calcium: 9.9 mg/dL (ref 8.7–10.3)
Chloride: 101 mmol/L (ref 96–106)
Creatinine, Ser: 1.21 mg/dL — ABNORMAL HIGH (ref 0.57–1.00)
GFR calc Af Amer: 46 mL/min/{1.73_m2} — ABNORMAL LOW (ref >59)
GFR calc non Af Amer: 40 mL/min/{1.73_m2} — ABNORMAL LOW (ref >59)
Glucose: 146 mg/dL — ABNORMAL HIGH (ref 65–99)
Potassium: 4.6 mmol/L (ref 3.5–5.2)
Sodium: 145 mmol/L — ABNORMAL HIGH (ref 134–144)

## 2019-03-19 NOTE — Progress Notes (Signed)
Subjective:    Patient ID: [name] Williams, female    DOB: 01-Feb-1928, 85 y.o.   MRN: [id number]  Chief Complaint  Patient presents with  . Medical Management of Chronic Issues    decreased appetite   Pt presents to the office today for chronic follow up. She reports a 17 lb weight loss over the last May 2020. She saw Oncologists on 03/12/19. She is scheduled for CT abdomen/pelvis on 03/23/19.  Gastroesophageal Reflux She reports no belching or no heartburn. This is a chronic problem. The current episode started more than 1 year ago. The problem occurs occasionally. The problem has been waxing and waning. The symptoms are aggravated by certain foods. She has tried a PPI for the symptoms. The treatment provided mild relief.  Insomnia Primary symptoms: difficulty falling asleep, frequent awakening.  The current episode started more than one year. The onset quality is gradual. The problem occurs intermittently. The problem has been waxing and waning since onset. PMH includes: depression.  Anxiety Presents for follow-up visit. Symptoms include decreased concentration, excessive worry, insomnia, nervous/anxious behavior, panic and restlessness. Symptoms occur most days. The severity of symptoms is moderate. The quality of sleep is good.    Depression        This is a chronic problem.  The current episode started more than 1 year ago.   The onset quality is gradual.   The problem occurs intermittently.  The problem has been waxing and waning since onset.  Associated symptoms include decreased concentration, insomnia and restlessness.  Past medical history includes anxiety.       Review of Systems  Gastrointestinal: Negative for heartburn.  Psychiatric/Behavioral: Positive for decreased concentration and depression. The patient is nervous/anxious and has insomnia.   All other systems reviewed and are negative.      Objective:   Physical Exam Vitals signs reviewed.  Constitutional:    General: She is not in acute distress.    Appearance: She is well-developed.  HENT:     Head: Normocephalic and atraumatic.     Right Ear: Tympanic membrane normal.     Left Ear: Tympanic membrane normal.  Eyes:     Pupils: Pupils are equal, round, and reactive to light.  Neck:     Musculoskeletal: Normal range of motion and neck supple.     Thyroid: No thyromegaly.  Cardiovascular:     Rate and Rhythm: Normal rate and regular rhythm.     Heart sounds: Normal heart sounds. No murmur.  Pulmonary:     Effort: Pulmonary effort is normal. No respiratory distress.     Breath sounds: Normal breath sounds. No wheezing.  Abdominal:     General: Bowel sounds are normal. There is no distension.     Palpations: Abdomen is soft.     Tenderness: There is no abdominal tenderness.  Musculoskeletal: Normal range of motion.        General: No tenderness.  Skin:    General: Skin is warm and dry.  Neurological:     Mental Status: She is alert and oriented to person, place, and time.     Cranial Nerves: No cranial nerve deficit.     Motor: Weakness present.     Deep Tendon Reflexes: Reflexes are normal and symmetric.  Psychiatric:        Behavior: Behavior normal.        Thought Content: Thought content normal.        Judgment: Judgment normal.  BP 132/84   Pulse 93   Temp (!) 96.6 F (35.9 C) (Temporal)   Ht '5\' 4"'$  (1.626 m)   Wt 119 lb (54 kg)   BMI 20.43 kg/m      Assessment & Plan:  [name] Williams comes in today with chief complaint of Medical Management of Chronic Issues (decreased appetite)   Diagnosis and orders addressed:  1. Gastroesophageal reflux disease, esophagitis presence not specified - BMP8+EGFR  2. Insomnia, unspecified type - BMP8+EGFR  3. Lymphocytosis - BMP8+EGFR  4. GAD (generalized anxiety disorder) - BMP8+EGFR  5. Hyperlipidemia, unspecified hyperlipidemia type - BMP8+EGFR  6. Recurrent major depressive disorder, in full remission (HCC)   - BMP8+EGFR  7. Decreased appetite  8. Weight loss  PT is scheduled for Ct abdomen next week Will hold off on any appetite stimulate at this time, she is asking. Encouraged to drink Ensure TID and we discussed that her alpha gal and to avoid beef, pork, and beef. As this may be causing some of her symptoms of itching. She will keep all Oncologists appts    Labs pending and Labs reviewed from the last few weeks Health Maintenance reviewed Diet and exercise encouraged  Follow up plan: 6 months and keep all appts with Oncologists and CT scan  Evelina Dun, FNP

## 2019-03-19 NOTE — Chronic Care Management (AMB) (Signed)
°  Chronic Care Management   Outreach Note  03/19/2019 Name: [name] Williams MRN: [id number] DOB: 25-Mar-1928  Referred by: Sharion Balloon, FNP Reason for referral : Chronic Care Management (Third CCM outreach was unsuccessful. )   Third unsuccessful telephone outreach was attempted today. The patient was referred to the case management team for assistance with chronic care management and care coordination. The patient's primary care provider has been notified of our unsuccessful attempts to make or maintain contact with the patient. The care management team is pleased to engage with this patient at any time in the future should he/she be interested in assistance from the care management team.   Follow Up Plan: The care management team is available to follow up with the patient after provider conversation with the patient regarding recommendation for care management engagement and subsequent re-referral to the care management team.   [place]  [email or web address]   ??[phone]

## 2019-03-19 NOTE — Patient Instructions (Signed)
Weakness °Weakness is a lack of strength. You may feel weak all over your body (generalized), or you may feel weak in one specific part of your body (focal). Common causes of weakness include: °· Infection and immune system disorders. °· Physical exhaustion. °· Internal bleeding or other blood loss that results in a lack of red blood cells (anemia). °· Dehydration. °· An imbalance in mineral (electrolyte) levels, such as potassium. °· Heart disease, circulation problems, or stroke. °Other causes include: °· Some medicines or cancer treatment. °· Stress, anxiety, or depression. °· Nervous system disorders. °· Thyroid disorders. °· Loss of muscle strength because of age or inactivity. °· Poor sleep quality or sleep disorders. °The cause of your weakness may not be known. Some causes of weakness can be serious, so it is important to see your health care provider. °Follow these instructions at home: °Activity °· Rest as needed. °· Try to get enough sleep. Most adults need 7-8 hours of quality sleep each night. Talk to your health care provider about how much sleep you need each night. °· Do exercises, such as arm curls and leg raises, for 30 minutes at least 2 days a week or as told by your health care provider. This helps build muscle strength. °· Consider working with a physical therapist or trainer who can develop an exercise plan to help you gain muscle strength. °General instructions ° °· Take over-the-counter and prescription medicines only as told by your health care provider. °· Eat a healthy, well-balanced diet. This includes: °? Proteins to build muscles, such as lean meats and fish. °? Fresh fruits and vegetables. °? Carbohydrates to boost energy, such as whole grains. °· Drink enough fluid to keep your urine pale yellow. °· Keep all follow-up visits as told by your health care provider. This is important. °Contact a health care provider if your weakness: °· Does not improve or gets worse. °· Affects your  ability to think clearly. °· Affects your ability to do your normal daily activities. °Get help right away if you: °· Develop sudden weakness, especially on one side of your face or body. °· Have chest pain. °· Have trouble breathing or shortness of breath. °· Have problems with your vision. °· Have trouble talking or swallowing. °· Have trouble standing or walking. °· Are light-headed or lose consciousness. °Summary °· Weakness is a lack of strength. You may feel weak all over your body or just in one specific part of your body. °· Weakness can be caused by a variety of things. In some cases, the cause may be unknown. °· Rest as needed, and try to get enough sleep. Most adults need 7-8 hours of quality sleep each night. °· Eat a healthy, well-balanced diet. °This information is not intended to replace advice given to you by your health care provider. Make sure you discuss any questions you have with your health care provider. °Document Released: 08/06/2005 Document Revised: 03/12/2018 Document Reviewed: 03/12/2018 °Elsevier Patient Education © 2020 Else[employer] Inc. ° °

## 2019-03-20 ENCOUNTER — Telehealth: Payer: Self-pay | Admitting: Family

## 2019-03-20 DIAGNOSIS — R531 Weakness: Secondary | ICD-10-CM

## 2019-03-20 NOTE — Telephone Encounter (Signed)
Spoke with daughter Mariann Laster).  No symptoms at this time.

## 2019-03-20 NOTE — Telephone Encounter (Signed)
Actually she is remote Monday, she is in on Tuesday.

## 2019-03-20 NOTE — Telephone Encounter (Signed)
I would be okay with that normally under my name but today I am virtual and I will not be back in the office for almost 2 weeks, so if we can maybe ask another provider who could follow-up on the results as I will not be able to

## 2019-03-20 NOTE — Telephone Encounter (Signed)
Are there symptoms she is experiencing?  If so, okay to do today. If not, can they do it on Monday when Alyse Low gets back?

## 2019-03-23 ENCOUNTER — Ambulatory Visit (HOSPITAL_COMMUNITY): Payer: Medicare HMO

## 2019-03-23 NOTE — Telephone Encounter (Signed)
Patient aware ok to bring in urine specimen.  Left message with daughter

## 2019-03-23 NOTE — Addendum Note (Signed)
Addended by: Evelina Dun A on: 03/23/2019 09:52 AM   Modules accepted: Orders

## 2019-03-23 NOTE — Telephone Encounter (Signed)
Pt can drop off urine any time. Orders in [place]

## 2019-03-24 ENCOUNTER — Ambulatory Visit (HOSPITAL_COMMUNITY): Payer: Medicare HMO | Admitting: Hematology

## 2019-03-24 ENCOUNTER — Other Ambulatory Visit: Payer: Medicare HMO

## 2019-03-24 ENCOUNTER — Other Ambulatory Visit: Payer: Self-pay

## 2019-03-24 DIAGNOSIS — R531 Weakness: Secondary | ICD-10-CM | POA: Diagnosis not present

## 2019-03-24 LAB — MICROSCOPIC EXAMINATION
Epithelial Cells (non renal): >10 /hpf — AB (ref 0–10)
Renal Epithel, UA: NONE SEEN /hpf
WBC, UA: >30 /hpf — AB (ref 0–5)

## 2019-03-24 LAB — URINALYSIS, COMPLETE
Bilirubin, UA: NEGATIVE
Glucose, UA: NEGATIVE
Nitrite, UA: NEGATIVE
Specific Gravity, UA: >1.03 — ABNORMAL HIGH (ref 1.005–1.030)
Urobilinogen, Ur: 1 mg/dL (ref 0.2–1.0)
pH, UA: 5.5 (ref 5.0–7.5)

## 2019-03-26 LAB — URINE CULTURE

## 2019-04-01 ENCOUNTER — Other Ambulatory Visit: Payer: Self-pay

## 2019-04-01 ENCOUNTER — Ambulatory Visit (HOSPITAL_COMMUNITY)
Admission: RE | Admit: 2019-04-01 | Discharge: 2019-04-01 | Disposition: A | Discharge status: Home / Self Care | Payer: Medicare HMO | Source: Ambulatory Visit | Attending: Hematology | Admitting: Hematology

## 2019-04-01 DIAGNOSIS — R918 Other nonspecific abnormal finding of lung field: Secondary | ICD-10-CM | POA: Diagnosis not present

## 2019-04-01 DIAGNOSIS — K5730 Diverticulosis of large intestine without perforation or abscess without bleeding: Secondary | ICD-10-CM | POA: Insufficient documentation

## 2019-04-01 DIAGNOSIS — I2510 Atherosclerotic heart disease of native coronary artery without angina pectoris: Secondary | ICD-10-CM | POA: Diagnosis not present

## 2019-04-01 DIAGNOSIS — I700 Atherosclerosis of aorta: Secondary | ICD-10-CM | POA: Diagnosis not present

## 2019-04-01 DIAGNOSIS — J479 Bronchiectasis, uncomplicated: Secondary | ICD-10-CM | POA: Diagnosis not present

## 2019-04-01 DIAGNOSIS — I714 Abdominal aortic aneurysm, without rupture: Secondary | ICD-10-CM | POA: Insufficient documentation

## 2019-04-01 DIAGNOSIS — R634 Abnormal weight loss: Secondary | ICD-10-CM | POA: Diagnosis not present

## 2019-04-01 DIAGNOSIS — J439 Emphysema, unspecified: Secondary | ICD-10-CM | POA: Diagnosis not present

## 2019-04-01 DIAGNOSIS — Z6820 Body mass index (BMI) 20.0-20.9, adult: Secondary | ICD-10-CM | POA: Diagnosis not present

## 2019-04-01 DIAGNOSIS — J8410 Pulmonary fibrosis, unspecified: Secondary | ICD-10-CM | POA: Diagnosis not present

## 2019-04-01 DIAGNOSIS — R590 Localized enlarged lymph nodes: Secondary | ICD-10-CM | POA: Insufficient documentation

## 2019-04-01 DIAGNOSIS — N2889 Other specified disorders of kidney and ureter: Secondary | ICD-10-CM | POA: Diagnosis not present

## 2019-04-01 DIAGNOSIS — J984 Other disorders of lung: Secondary | ICD-10-CM | POA: Diagnosis not present

## 2019-04-01 MED ORDER — IOHEXOL 300 MG/ML  SOLN
100.0000 mL | Freq: Once | INTRAMUSCULAR | Status: AC | PRN
  Administered 2019-04-01: 12:00:00 75 mL via INTRAVENOUS

## 2019-04-02 ENCOUNTER — Ambulatory Visit (HOSPITAL_COMMUNITY): Payer: Medicare HMO | Admitting: Hematology

## 2019-04-07 ENCOUNTER — Other Ambulatory Visit: Payer: Self-pay

## 2019-04-07 ENCOUNTER — Inpatient Hospital Stay (HOSPITAL_COMMUNITY): Payer: Medicare HMO | Attending: Hematology | Admitting: Hematology

## 2019-04-07 ENCOUNTER — Encounter (HOSPITAL_COMMUNITY): Payer: Self-pay | Admitting: Hematology

## 2019-04-07 VITALS — BP 145/87 | HR 102 | Temp 97.7°F | Resp 18 | Wt 113.0 lb

## 2019-04-07 DIAGNOSIS — C9110 Chronic lymphocytic leukemia of B-cell type not having achieved remission: Secondary | ICD-10-CM

## 2019-04-07 DIAGNOSIS — Z811 Family history of alcohol abuse and dependence: Secondary | ICD-10-CM | POA: Insufficient documentation

## 2019-04-07 DIAGNOSIS — Z79899 Other long term (current) drug therapy: Secondary | ICD-10-CM | POA: Diagnosis not present

## 2019-04-07 DIAGNOSIS — Z809 Family history of malignant neoplasm, unspecified: Secondary | ICD-10-CM | POA: Insufficient documentation

## 2019-04-07 DIAGNOSIS — Z87891 Personal history of nicotine dependence: Secondary | ICD-10-CM | POA: Insufficient documentation

## 2019-04-07 DIAGNOSIS — N189 Chronic kidney disease, unspecified: Secondary | ICD-10-CM | POA: Insufficient documentation

## 2019-04-07 DIAGNOSIS — R634 Abnormal weight loss: Secondary | ICD-10-CM | POA: Insufficient documentation

## 2019-04-07 DIAGNOSIS — F329 Major depressive disorder, single episode, unspecified: Secondary | ICD-10-CM | POA: Insufficient documentation

## 2019-04-07 DIAGNOSIS — Z818 Family history of other mental and behavioral disorders: Secondary | ICD-10-CM | POA: Diagnosis not present

## 2019-04-07 DIAGNOSIS — Z800 Family history of malignant neoplasm of digestive organs: Secondary | ICD-10-CM | POA: Insufficient documentation

## 2019-04-07 DIAGNOSIS — Z803 Family history of malignant neoplasm of breast: Secondary | ICD-10-CM | POA: Insufficient documentation

## 2019-04-07 DIAGNOSIS — Z8049 Family history of malignant neoplasm of other genital organs: Secondary | ICD-10-CM | POA: Diagnosis not present

## 2019-04-07 DIAGNOSIS — D72820 Lymphocytosis (symptomatic): Secondary | ICD-10-CM | POA: Insufficient documentation

## 2019-04-07 MED ORDER — DRONABINOL 2.5 MG PO CAPS: 2.5000 mg | ORAL_CAPSULE | Freq: Two times a day (BID) | ORAL | 1 refills | Status: DC

## 2019-04-07 NOTE — Progress Notes (Signed)
[address] [place], [place] [address]   CLINIC:  Medical Oncology/Hematology  PCP:  Sharion Balloon, [address] [place] [phone]   REASON FOR VISIT:  Follow-up for lymphocytosis and weight loss.    INTERVAL HISTORY:  Ms. Obi 85 y.o. female seen for follow-up after blood work and CT scans for lymphocytosis work-up.  She lost another 6 pounds from last visit.  She reports no improvement in appetite.  She is taking Remeron 15 mg at bedtime.  It is helping her sleep but not appetite.  Appetite is low and energy levels are 25%.  No pain reported.  No fevers or night sweats.  She is drinking 1 can of boost and eating very small portions.  Denies any ER visits or hospitalizations.   REVIEW OF SYSTEMS:  Review of Systems  Constitutional: Positive for unexpected weight change.  All other systems reviewed and are negative.    PAST MEDICAL/SURGICAL HISTORY:  Past Medical History:  Diagnosis Date  . Anxiety   . Depressive disorder, not elsewhere classified   . Esophageal reflux   . Essential hypertension, benign   . Hypertr obst cardiomyop   . Other and unspecified hyperlipidemia   . Stroke ([place])   . Unspecified transient cerebral ischemia    Past Surgical History:  Procedure Laterality Date  . BLADDER REPAIR    . CERVIX SURGERY    . CHOLECYSTECTOMY    . TONSILLECTOMY    . TUBAL LIGATION       SOCIAL HISTORY:  Social History   Socioeconomic History  . Marital status: Widowed    Spouse name: Not on file  . Number of children: 3  . Years of education: Not on file  . Highest education level: 9th grade  Occupational History  . Not on file  Social Needs  . Financial resource strain: Not hard at all  . Food insecurity    Worry: Never true    Inability: Never true  . Transportation needs    Medical: No    Non-medical: No  Tobacco Use  . Smoking status: Former Research scientist (life sciences)  . Smokeless tobacco: Never Used  .  Tobacco comment: quit 2012  Substance and Sexual Activity  . Alcohol use: No  . Drug use: No  . Sexual activity: Not Currently  Lifestyle  . Physical activity    Days per week: 0 days    Minutes per session: 0 min  . Stress: Rather much  Relationships  . Social Herbalist on phone: Once a week    Gets together: More than three times a week    Attends religious service: More than 4 times per year    Active member of club or organization: Yes    Attends meetings of clubs or organizations: More than 4 times per year    Relationship status: Widowed  . Intimate partner violence    Fear of current or ex partner: No    Emotionally abused: No    Physically abused: No    Forced sexual activity: No  Other Topics Concern  . Not on file  Social History Narrative   Patient is retired.    Patient is widowed.    Patient has 3 children.    Patient is right handed.    Pt lives with son.      FAMILY HISTORY:  Family History  Problem Relation Age of Onset  . Breast cancer Mother   .  Stomach cancer Sister   . Uterine cancer Sister   . Depression Sister   . Cancer Brother   . Alcohol abuse Son     CURRENT MEDICATIONS:  Outpatient Encounter Medications as of 04/07/2019  Medication Sig Note  . Amino Acids-Protein Hydrolys (FEEDING SUPPLEMENT, PRO-STAT SUGAR FREE 64,) LIQD Take 30 mLs by mouth 3 (three) times daily with meals.   Marland Kitchen aspirin 81 MG EC tablet Take 81 mg by mouth every other day.  10/06/2014: Received from: [facility]  . mirtazapine (REMERON) 15 MG tablet TAKE 1 TABLET BY MOUTH EVERYDAY AT BEDTIME   . MISC NATURAL PRODUCTS PO Take 1 tablet by mouth daily. Selenex GSH   . dronabinol (MARINOL) 2.5 MG capsule Take 1 capsule (2.5 mg total) by mouth 2 (two) times daily before a meal.   . ENSURE (ENSURE) Take 237 mLs by mouth.    No facility-administered encounter medications on file as of 04/07/2019.     ALLERGIES:  Allergies  Allergen Reactions  . Latex     Per  pt 11-10-14 she do not remember being allergic to this.../or     PHYSICAL EXAM:  ECOG Performance status: 1  Vitals:   04/07/19 1555  BP: (!) 145/87  Pulse: (!) 102  Resp: 18  Temp: 97.7 F (36.5 C)  SpO2: 100%   Filed Weights   04/07/19 1555  Weight: 113 lb (51.3 kg)    Physical Exam Vitals signs reviewed.  Constitutional:      Appearance: Normal appearance.  Cardiovascular:     Rate and Rhythm: Normal rate and regular rhythm.     Heart sounds: Normal heart sounds.  Pulmonary:     Effort: Pulmonary effort is normal.     Breath sounds: Normal breath sounds.  Abdominal:     General: There is no distension.     Palpations: Abdomen is soft. There is no mass.  Musculoskeletal:        General: No swelling.  Lymphadenopathy:     Cervical: No cervical adenopathy.  Skin:    General: Skin is warm.  Neurological:     General: No focal deficit present.     Mental Status: She is alert and oriented to person, place, and time.  Psychiatric:        Mood and Affect: Mood normal.        Behavior: Behavior normal.      LABORATORY DATA:  I have reviewed the labs as listed.  CBC    Component Value Date/Time   WBC 13.8 (H) 03/12/2019 1357   RBC 5.40 (H) 03/12/2019 1357   RBC 5.40 (H) 03/12/2019 1357   HGB 16.3 (H) 03/12/2019 1357   HGB 15.8 02/23/2019 1513   HCT 51.3 (H) 03/12/2019 1357   HCT 47.8 (H) 02/23/2019 1513   PLT 226 03/12/2019 1357   PLT 217 02/23/2019 1513   MCV 95.0 03/12/2019 1357   MCV 92 02/23/2019 1513   MCH 30.2 03/12/2019 1357   MCHC 31.8 03/12/2019 1357   RDW 13.3 03/12/2019 1357   RDW 12.5 02/23/2019 1513   LYMPHSABS 7.1 (H) 03/12/2019 1357   LYMPHSABS 7.7 (H) 02/23/2019 1513   MONOABS 1.0 03/12/2019 1357   EOSABS 0.5 03/12/2019 1357   EOSABS 0.3 02/23/2019 1513   BASOSABS 0.1 03/12/2019 1357   BASOSABS 0.1 02/23/2019 1513   CMP Latest Ref Rng & Units 03/19/2019 02/23/2019 02/13/2019  Glucose 65 - 99 mg/dL 146(H) 108(H) -  BUN 8 - 27 mg/dL 24  14 -  Creatinine 0.57 - 1.00 mg/dL 1.21(H) 1.32(H) 1.20(H)  Sodium 134 - 144 mmol/L 145(H) 143 -  Potassium 3.5 - 5.2 mmol/L 4.6 5.2 -  Chloride 96 - 106 mmol/L 101 103 -  CO2 20 - 29 mmol/L 24 27 -  Calcium 8.7 - 10.3 mg/dL 9.9 9.8 -  Total Protein 6.0 - 8.5 g/dL - 6.2 -  Total Bilirubin 0.0 - 1.2 mg/dL - 0.7 -  Alkaline Phos 39 - 117 IU/L - 82 -  AST 0 - 40 IU/L - 21 -  ALT 0 - 32 IU/L - 8 -       DIAGNOSTIC IMAGING:  I have independently reviewed the scans and discussed with the patient.   I have reviewed Venita Lick LPN's note and agree with the documentation.  I personally performed a face-to-face visit, made revisions and my assessment and plan is as follows.    ASSESSMENT & PLAN:   CLL (chronic lymphocytic leukemia) ([place]) 1.  Chronic lymphocytic leukemia: -Patient evaluated for elevated white count since 2009. - Denied any fevers or night sweats.  Reported 18 pound weight loss in the last 2 months. -We discussed results of peripheral blood flow cytometry which showed CD5 positive monoclonal B-cell population, comprising 37% of fall lymphocytes.  Immunophenotype consistent with CLL. -Physical exam did not reveal any splenomegaly or lymphadenopathy. -We discussed results of the CT CAP on 04/01/2019 which showed mild mediastinal and bilateral hilar adenopathy measuring up to 11 mm.  Noncalcified pulmonary nodules seen in the right upper lobe. -I have also called and talked to her daughter about the diagnosis. - She would require therapy, if there is no other cause for her weight loss which is unexplained at this time. -I will reevaluate her in 2 weeks.  I have sent testing for CLL FISH panel, I GVH mutation status, and TP 53 mutation.  2. weight loss: -18 pound weight loss in the last 2 months since the tick bite. -Lyme disease and RMSF antibody panel was negative.  Alpha gal panel showed low positivity for beef and pork IgE. -She lost another 6 pounds since last visit.  -She is taking Remeron 15 mg which is helping her sleep but not appetite. -I will start her on Marinol 2.5 mg twice daily.  I will reevaluate her in 2 weeks.  3.  Health maintenance: -Last colonoscopy was in October 2003 which showed normal rectum and diverticular disease.  4.  CKD: -He had mild CKD since February 2016.   Total time spent is 40 minutes with more than 50% of the time spent face-to-face discussing new diagnosis, treatment indications, counseling and coordination of care.  Orders placed this encounter:  Orders Placed This Encounter  Procedures  . IgVH Somatic Hypermutation  . Miscellaneous [facility] test (send-out)      Derek Jack, MD [place] [phone]

## 2019-04-07 NOTE — Patient Instructions (Addendum)
[place] at [facility] Discharge Instructions  You were seen today by Dr. Delton Coombes. He went over your recent lab results.  You have a form of Leukemia called CLL. He will see you back in 2 weeks for labs and follow up.   Thank you for choosing [place] at [facility] to provide your oncology and hematology care.  To afford each patient quality time with our provider, please arrive at least 15 minutes before your scheduled appointment time.   If you have a lab appointment with the [facility] please come in thru the  Main Entrance and check in at the main information desk  You need to re-schedule your appointment should you arrive 10 or more minutes late.  We strive to give you quality time with our providers, and arriving late affects you and other patients whose appointments are after yours.  Also, if you no show three or more times for appointments you may be dismissed from the clinic at the providers discretion.     Again, thank you for choosing [facility].  Our hope is that these requests will decrease the amount of time that you wait before being seen by our physicians.       _____________________________________________________________  Should you have questions after your visit to [facility], please contact our office at [phone] between the hours of 8:00 a.m. and 4:30 p.m.  Voicemails left after 4:00 p.m. will not be returned until the following business day.  For prescription refill requests, have your pharmacy contact our office and allow 72 hours.    Cancer Center Support Programs:   > Cancer Support Group  2nd Tuesday of the month 1pm-2pm, Journey Room    Chronic Lymphocytic Leukemia Chronic lymphocytic leukemia (CLL) is a type of cancer of the blood cells and soft tissue inside bones (bone marrow). CLL happens when your bone marrow makes too many abnormal white blood cells. The cells, called  leukemia cells, do not function normally and accumulate in the blood. Eventually they crowd out other healthy blood cells. CLL usually gets worse slowly. It can cause complications in your organs, such as in your spleen. It can also weaken your immune system and lead to conditions in which your immune system attacks your body (autoimmune conditions). What are the causes? The cause of this condition is not known. What increases the risk? You are more likely to develop this condition if:  You are older than 50 years.  You are white.  You are female.  You have a family history of CLL or other cancers of the lymph system.  You are of [place] or [place] descent.  You have been exposed to certain chemicals, such as: ? Insecticides. ? Herbicides. These include Agent Orange, a herbicide used in the [place] war. What are the signs or symptoms? At first, there may be no symptoms. After a while, symptoms may include:  Feeling more tired than usual, even after rest.  Unplanned weight loss.  Heavy sweating at night.  Fever.  Shortness of breath.  Paleness.  Painless, swollen lymph nodes.  A feeling of fullness in the upper left part of the abdomen.  Easy bruising or bleeding.  Frequent infections. How is this diagnosed? This condition is diagnosed based on:  A physical exam to check for an enlarged spleen, liver, or lymph nodes.  Blood and bone marrow tests to check for leukemia cells. Tests  may include: ? A complete blood count. ? Flow cytometry. This method uses light sensors and dyes to figure out the number of cells as well as their size, structure, and general health. ? Immunophenotyping. This method is used to diagnose leukemia by identifying specific antibodies found in white blood cells. The test is used when a complete blood count shows the presence of immature cells or a high number of white blood cells. ? Fluorescence in situ hybridization (FISH).  This test is used to examine defects in chromosomes and how those defects affect the functioning of the cell. Results from a [place] test will be used to determine treatment and assess the outcome of that treatment.  A CT scan to check for swelling or anything abnormal in your spleen, liver, and lymph nodes. How is this treated? Treatment for this condition depends on the stage of the leukemia and whether you have symptoms. Treatment may include:  Observation.  Targeted drugs. These are medicines that interfere with the way leukemia cells grow and multiply. They identify and attack specific leukemia cells without harming normal cells.  Chemotherapy drugs. These are medicines that kill leukemia cells that are multiplying quickly.  Radiation.  Surgery to remove the spleen.  Biological therapy (immunotherapy). This treatment boosts the ability of your immune system to fight the leukemia cells.  Bone marrow or peripheral blood stem cell transplant. This treatment replaces your own bone marrow or stem cells with bone marrow or stem cells from a donor. This treatment may be done after you receive very high doses of chemotherapy or radiation that kill your stem cells and bone marrow.  New treatments through clinical trials. Additional medicines may be needed to help manage symptoms. Follow these instructions at home: Medicines  Take over-the-counter and prescription medicines only as told by your health care provider.  If you were prescribed an antibiotic medicine, take it as told by your health care provider. Do not stop taking the antibiotic even if you start to feel better. If you are on chemotherapy:  Wash your hands often, especially before meals, after being outside, and after using the toilet. Have visitors do the same.  Keep your teeth and gums clean and well cared for. Use soft toothbrushes.  Protect your skin from the sun by using sunscreen and wearing protective clothing. General  instructions  Avoid contact sports or other rough activities. Ask your health care provider what activities are safe for you.  Avoid crowded places and people who are sick.  Tell your cancer care team if you develop side effects. They may be able to recommend ways to relieve them.  Try to eat regular, healthy meals. Some of your treatments might affect your appetite.  Find healthy ways of coping with stress, such as by doing yoga or meditation or by joining a support group.  Keep all follow-up visits as told by your health care provider. This is important. Where to find more information  American Cancer Society: www.cancer.org  Leukemia and Lymphoma Society: PreviewPal.pl  National Cancer Institute ([place]): www.cancer.gov Contact a health care provider if:  You have pain in your abdomen.  You develop new bruises that are getting bigger.  You have painful or more swollen lymph nodes.  You develop bleeding from your gums or nose.  You cannot eat or drink without vomiting.  You feel lightheaded. Get help right away if:  You have a fever or chills.  You develop chest pain.  You have trouble breathing or feel short  of breath.  You faint.  There is blood in your urine or stool.  You have excessive bleeding.  You have any symptoms that are severe or uncontrolled. Summary  Chronic lymphocytic leukemia (CLL) is a type of cancer of the blood cells and bone marrow.  This condition can cause an enlarged spleen, swollen lymph nodes, a weakened immune system, low red blood cell and platelets counts, and autoimmune conditions.  Treatment for this condition depends on the stage of the cancer and whether you have symptoms.  Chemotherapy, radiation, surgery to remove the spleen, and bone marrow transplant are some of the ways to treat CLL. This information is not intended to replace advice given to you by your health care provider. Make sure you discuss any questions you have with  your health care provider. Document Released: 12/23/2008 Document Revised: 11/14/2017 Document Reviewed: 07/18/2016 Elsevier Patient Education  2020 [employer].

## 2019-04-07 NOTE — Assessment & Plan Note (Signed)
1.  Chronic lymphocytic leukemia: -Patient evaluated for elevated white count since 2009. - Denied any fevers or night sweats.  Reported 18 pound weight loss in the last 2 months. -We discussed results of peripheral blood flow cytometry which showed CD5 positive monoclonal B-cell population, comprising 37% of fall lymphocytes.  Immunophenotype consistent with CLL. -Physical exam did not reveal any splenomegaly or lymphadenopathy. -We discussed results of the CT CAP on 04/01/2019 which showed mild mediastinal and bilateral hilar adenopathy measuring up to 11 mm.  Noncalcified pulmonary nodules seen in the right upper lobe. -I have also called and talked to her daughter about the diagnosis. - She would require therapy, if there is no other cause for her weight loss which is unexplained at this time. -I will reevaluate her in 2 weeks.  I have sent testing for CLL FISH panel, I GVH mutation status, and TP 53 mutation.  2. weight loss: -18 pound weight loss in the last 2 months since the tick bite. -Lyme disease and RMSF antibody panel was negative.  Alpha gal panel showed low positivity for beef and pork IgE. -She lost another 6 pounds since last visit. -She is taking Remeron 15 mg which is helping her sleep but not appetite. -I will start her on Marinol 2.5 mg twice daily.  I will reevaluate her in 2 weeks.  3.  Health maintenance: -Last colonoscopy was in October 2003 which showed normal rectum and diverticular disease.  4.  CKD: -He had mild CKD since February 2016.

## 2019-04-08 ENCOUNTER — Inpatient Hospital Stay (HOSPITAL_COMMUNITY): Payer: Medicare HMO

## 2019-04-08 ENCOUNTER — Other Ambulatory Visit: Payer: Self-pay

## 2019-04-08 ENCOUNTER — Other Ambulatory Visit (HOSPITAL_COMMUNITY): Payer: Self-pay | Admitting: *Deleted

## 2019-04-08 ENCOUNTER — Other Ambulatory Visit (HOSPITAL_COMMUNITY)
Admission: RE | Admit: 2019-04-08 | Discharge: 2019-04-08 | Disposition: A | Discharge status: Home / Self Care | Payer: Medicare HMO | Source: Ambulatory Visit | Attending: Hematology | Admitting: Hematology

## 2019-04-08 DIAGNOSIS — C9110 Chronic lymphocytic leukemia of B-cell type not having achieved remission: Secondary | ICD-10-CM

## 2019-04-08 DIAGNOSIS — F329 Major depressive disorder, single episode, unspecified: Secondary | ICD-10-CM | POA: Diagnosis not present

## 2019-04-08 DIAGNOSIS — Z803 Family history of malignant neoplasm of breast: Secondary | ICD-10-CM | POA: Diagnosis not present

## 2019-04-08 DIAGNOSIS — Z800 Family history of malignant neoplasm of digestive organs: Secondary | ICD-10-CM | POA: Diagnosis not present

## 2019-04-08 DIAGNOSIS — Z79899 Other long term (current) drug therapy: Secondary | ICD-10-CM | POA: Diagnosis not present

## 2019-04-08 DIAGNOSIS — Z87891 Personal history of nicotine dependence: Secondary | ICD-10-CM | POA: Diagnosis not present

## 2019-04-08 DIAGNOSIS — R634 Abnormal weight loss: Secondary | ICD-10-CM | POA: Diagnosis not present

## 2019-04-08 DIAGNOSIS — D72820 Lymphocytosis (symptomatic): Secondary | ICD-10-CM | POA: Diagnosis not present

## 2019-04-08 DIAGNOSIS — Z818 Family history of other mental and behavioral disorders: Secondary | ICD-10-CM | POA: Diagnosis not present

## 2019-04-08 DIAGNOSIS — Z8049 Family history of malignant neoplasm of other genital organs: Secondary | ICD-10-CM | POA: Diagnosis not present

## 2019-04-08 NOTE — Progress Notes (Unsigned)
I spoke with Esmond Harps. in cytometry and asked that TP53 be ordered on today's lab draw per Dr. Delton Coombes.

## 2019-04-08 NOTE — Progress Notes (Signed)
email sent to pathology to order TP53 on accession # [id number].   

## 2019-04-09 ENCOUNTER — Encounter: Payer: Self-pay | Admitting: Family

## 2019-04-09 ENCOUNTER — Ambulatory Visit (INDEPENDENT_AMBULATORY_CARE_PROVIDER_SITE_OTHER): Payer: Medicare HMO | Admitting: Family

## 2019-04-09 VITALS — BP 108/76 | HR 94 | Temp 97.3°F | Ht 64.0 in | Wt 119.0 lb

## 2019-04-09 DIAGNOSIS — K219 Gastro-esophageal reflux disease without esophagitis: Secondary | ICD-10-CM | POA: Diagnosis not present

## 2019-04-09 DIAGNOSIS — F3342 Major depressive disorder, recurrent, in full remission: Secondary | ICD-10-CM | POA: Diagnosis not present

## 2019-04-09 DIAGNOSIS — C9110 Chronic lymphocytic leukemia of B-cell type not having achieved remission: Secondary | ICD-10-CM

## 2019-04-09 DIAGNOSIS — F411 Generalized anxiety disorder: Secondary | ICD-10-CM | POA: Diagnosis not present

## 2019-04-09 MED ORDER — ESCITALOPRAM OXALATE 5 MG PO TABS: 5.0000 mg | ORAL_TABLET | Freq: Every day | ORAL | 1 refills | Status: DC

## 2019-04-09 NOTE — Progress Notes (Signed)
Subjective:    Patient ID: [name] Williams, female    DOB: Jun 06, 1928, 85 y.o.   MRN: [id number]  Chief Complaint  Patient presents with  . Medical Management of Chronic Issues    six month recheck   Pt presents to the office today for follow up. She had an appointment with the Oncologists yesterday. She has been diagnosed with CLL. PT is unsure of her diagnoses. She has lost 18 pounds over the last two months. She had a CT scan on 04/01/19.  She has follow up with him in 2 weeks. She was also started on Marinol 2.5 mg BID. She states she is feeling slightly better.  Depression        This is a chronic problem.  The current episode started more than 1 year ago.   The onset quality is gradual.   The problem occurs intermittently.  The problem has been waxing and waning since onset.  Associated symptoms include decreased concentration, irritable, restlessness, decreased interest and sad.  Associated symptoms include no helplessness and no hopelessness.  Past treatments include nothing.  Compliance with treatment is good.  Past medical history includes anxiety.   Anxiety Presents for follow-up visit. Symptoms include decreased concentration, depressed mood, excessive worry, irritability, nervous/anxious behavior and restlessness. The severity of symptoms is moderate.    Gastroesophageal Reflux She complains of belching and heartburn. This is a chronic problem. The current episode started more than 1 year ago. The problem occurs occasionally. The problem has been waxing and waning. She has tried head elevation and an antacid for the symptoms. The treatment provided significant relief.      Review of Systems  Constitutional: Positive for irritability.  Gastrointestinal: Positive for heartburn.  Psychiatric/Behavioral: Positive for decreased concentration and depression. The patient is nervous/anxious.   All other systems reviewed and are negative.      Objective:   Physical Exam Vitals  signs reviewed.  Constitutional:      General: She is irritable. She is not in acute distress.    Appearance: She is well-developed.  HENT:     Head: Normocephalic and atraumatic.     Right Ear: Tympanic membrane normal.     Left Ear: Tympanic membrane normal.  Eyes:     Pupils: Pupils are equal, round, and reactive to light.  Neck:     Musculoskeletal: Normal range of motion and neck supple.     Thyroid: No thyromegaly.  Cardiovascular:     Rate and Rhythm: Normal rate and regular rhythm.     Heart sounds: Normal heart sounds. No murmur.  Pulmonary:     Effort: Pulmonary effort is normal. No respiratory distress.     Breath sounds: Normal breath sounds. No wheezing.  Abdominal:     General: Bowel sounds are normal. There is no distension.     Palpations: Abdomen is soft.     Tenderness: There is no abdominal tenderness.  Musculoskeletal: Normal range of motion.        General: No tenderness.  Skin:    General: Skin is warm and dry.  Neurological:     Mental Status: She is alert and oriented to person, place, and time.     Cranial Nerves: No cranial nerve deficit.     Deep Tendon Reflexes: Reflexes are normal and symmetric.  Psychiatric:        Behavior: Behavior normal.        Thought Content: Thought content normal.  Judgment: Judgment normal.       BP 108/76   Pulse 94   Temp (!) 97.3 F (36.3 C) (Temporal)   Ht 5\' 4"  (1.626 m)   Wt 119 lb (54 kg)   BMI 20.43 kg/m      Assessment & Plan:  [name] Williams comes in today with chief complaint of Medical Management of Chronic Issues (six month recheck)   Diagnosis and orders addressed:  1. GAD (generalized anxiety disorder) Will start lexapro today Stress management discussed - escitalopram (LEXAPRO) 5 MG tablet; Take 1 tablet (5 mg total) by mouth daily.  Dispense: 90 tablet; Refill: 1  2. Recurrent major depressive disorder, in full remission ([place]) - escitalopram (LEXAPRO) 5 MG tablet; Take 1 tablet  (5 mg total) by mouth daily.  Dispense: 90 tablet; Refill: 1  3. CLL (chronic lymphocytic leukemia) ([place]) -Keep Oncologists appt   4. Gastroesophageal reflux disease, esophagitis presence not specified -Diet discussed- Avoid fried, spicy, citrus foods, caffeine and alcohol -Do not eat 2-3 hours before bedtime -Encouraged small frequent meals -Avoid NSAID's   Labs reviewed Health Maintenance reviewed Diet and exercise encouraged  Follow up plan: 6 weeks to recheck GAD and Depression   Evelina Dun, FNP

## 2019-04-09 NOTE — Patient Instructions (Signed)

## 2019-04-17 LAB — IGVH SOMATIC HYPERMUTATION

## 2019-04-21 LAB — FISH HES LEUKEMIA, 4Q12 REA

## 2019-04-22 ENCOUNTER — Encounter (HOSPITAL_COMMUNITY): Payer: Self-pay | Admitting: Hematology

## 2019-04-23 ENCOUNTER — Other Ambulatory Visit: Payer: Self-pay

## 2019-04-23 ENCOUNTER — Encounter (HOSPITAL_COMMUNITY): Payer: Self-pay | Admitting: Hematology

## 2019-04-23 ENCOUNTER — Inpatient Hospital Stay (HOSPITAL_COMMUNITY): Payer: Medicare HMO | Attending: Hematology | Admitting: Hematology

## 2019-04-23 DIAGNOSIS — Z7982 Long term (current) use of aspirin: Secondary | ICD-10-CM | POA: Insufficient documentation

## 2019-04-23 DIAGNOSIS — N189 Chronic kidney disease, unspecified: Secondary | ICD-10-CM | POA: Insufficient documentation

## 2019-04-23 DIAGNOSIS — Z87891 Personal history of nicotine dependence: Secondary | ICD-10-CM | POA: Diagnosis not present

## 2019-04-23 DIAGNOSIS — M7989 Other specified soft tissue disorders: Secondary | ICD-10-CM | POA: Diagnosis not present

## 2019-04-23 DIAGNOSIS — Z79899 Other long term (current) drug therapy: Secondary | ICD-10-CM | POA: Insufficient documentation

## 2019-04-23 DIAGNOSIS — R634 Abnormal weight loss: Secondary | ICD-10-CM | POA: Diagnosis not present

## 2019-04-23 DIAGNOSIS — C9110 Chronic lymphocytic leukemia of B-cell type not having achieved remission: Secondary | ICD-10-CM | POA: Diagnosis not present

## 2019-04-23 NOTE — Assessment & Plan Note (Signed)
1.  CLL, IGH V mutated, Tp53 negative: - She had elevated white count since 2009.  No fevers or night sweats.  Almost 20 pound weight loss in the last 2 months. - Peripheral blood flow cytometry was consistent with CLL. - Physical exam did not show any splenomegaly or lymphadenopathy. -CT CAP on 04/01/2019 showed mild mediastinal and bilateral hilar adenopathy measuring up to 11 mm.  Noncalcified pulmonary nodule seen in the right upper lobe. - CLL FISH panel showed trisomy 12 and 13 q. deletion. - She reported improvement in her appetite since we started her on Marinol.  At this time we would wait and watch.  I will see her back in 4 weeks.  If she does continues to lose weight, I will consider starting therapy with newer agents like ibrutinib.  2.  Weight loss: - 18-20 pound weight loss in the last 2 months since the tick bite. -Lyme disease and RMSF antibody panel was negative.  Alpha gal panel showed low positivity for beef and pork IgE. - We have started her on Marinol 2.5 mg twice daily at last visit on 04/07/2019. -She reported improvement in her appetite.  She gained 1 pound since last visit. -We will reevaluate her in 4 weeks.  3.  Health maintenance: -Last colonoscopy was in October 2003 which showed normal rectum and diverticular disease.  4.  CKD: -He had mild CKD since February 2016.

## 2019-04-23 NOTE — Progress Notes (Signed)
[address], [place] [address]   CLINIC:  Medical Oncology/Hematology  PCP:  Sharion Balloon, [place] [address] [phone]   REASON FOR VISIT:  CLL and weight loss.    INTERVAL HISTORY:  [name] Williams 85 y.o. female seen for follow-up of CLL.  We have sent blood work for special testing for CLL FISH panel.  She was started on Marinol 2.5 mg twice daily at last visit on 04/07/2019.  She reported improvement in her appetite.  She gained about 1 pound.  Appetite has improved 50%.  Energy levels are 50%.  No pain is reported.  No fevers or night sweats.  Denies any nausea, vomiting, diarrhea or change in bowel habits.  Ankle swelling has been stable.  Urinary frequency was also stable.   REVIEW OF SYSTEMS:  Review of Systems  Cardiovascular: Positive for leg swelling.  All other systems reviewed and are negative.    PAST MEDICAL/SURGICAL HISTORY:  Past Medical History:  Diagnosis Date  . Anxiety   . Depressive disorder, not elsewhere classified   . Esophageal reflux   . Essential hypertension, benign   . Hypertr obst cardiomyop   . Other and unspecified hyperlipidemia   . Stroke ([place])   . Unspecified transient cerebral ischemia    Past Surgical History:  Procedure Laterality Date  . BLADDER REPAIR    . CERVIX SURGERY    . CHOLECYSTECTOMY    . TONSILLECTOMY    . TUBAL LIGATION       SOCIAL HISTORY:  Social History   Socioeconomic History  . Marital status: Widowed    Spouse name: Not on file  . Number of children: 3  . Years of education: Not on file  . Highest education level: 9th grade  Occupational History  . Not on file  Social Needs  . Financial resource strain: Not hard at all  . Food insecurity    Worry: Never true    Inability: Never true  . Transportation needs    Medical: No    Non-medical: No  Tobacco Use  . Smoking status: Former Research scientist (life sciences)  . Smokeless tobacco: Never Used  .  Tobacco comment: quit 2012  Substance and Sexual Activity  . Alcohol use: No  . Drug use: No  . Sexual activity: Not Currently  Lifestyle  . Physical activity    Days per week: 0 days    Minutes per session: 0 min  . Stress: Rather much  Relationships  . Social Herbalist on phone: Once a week    Gets together: More than three times a week    Attends religious service: More than 4 times per year    Active member of club or organization: Yes    Attends meetings of clubs or organizations: More than 4 times per year    Relationship status: Widowed  . Intimate partner violence    Fear of current or ex partner: No    Emotionally abused: No    Physically abused: No    Forced sexual activity: No  Other Topics Concern  . Not on file  Social History Narrative   Patient is retired.    Patient is widowed.    Patient has 3 children.    Patient is right handed.    Pt lives with son.      FAMILY HISTORY:  Family History  Problem Relation Age of Onset  . Breast cancer  Mother   . Stomach cancer Sister   . Uterine cancer Sister   . Depression Sister   . Cancer Brother   . Alcohol abuse Son     CURRENT MEDICATIONS:  Outpatient Encounter Medications as of 04/23/2019  Medication Sig Note  . aspirin 81 MG EC tablet Take 81 mg by mouth every other day.  10/06/2014: Received from: [facility]  . dronabinol (MARINOL) 2.5 MG capsule Take 1 capsule (2.5 mg total) by mouth 2 (two) times daily before a meal.   . escitalopram (LEXAPRO) 5 MG tablet Take 1 tablet (5 mg total) by mouth daily.   . mirtazapine (REMERON) 15 MG tablet TAKE 1 TABLET BY MOUTH EVERYDAY AT BEDTIME   . MISC NATURAL PRODUCTS PO Take 1 tablet by mouth daily. Selenex [place]    No facility-administered encounter medications on file as of 04/23/2019.     ALLERGIES:  Allergies  Allergen Reactions  . Latex     Per pt 11-10-14 she do not remember being allergic to this.../or     PHYSICAL EXAM:  ECOG Performance  status: 1  Vitals:   04/23/19 1528  BP: (!) 113/58  Pulse: 68  Resp: 18  Temp: 97.8 F (36.6 C)  SpO2: 96%   Filed Weights   04/23/19 1528  Weight: 120 lb 4.8 oz (54.6 kg)    Physical Exam Vitals signs reviewed.  Constitutional:      Appearance: Normal appearance.  Cardiovascular:     Rate and Rhythm: Normal rate and regular rhythm.     Heart sounds: Normal heart sounds.  Pulmonary:     Effort: Pulmonary effort is normal.     Breath sounds: Normal breath sounds.  Abdominal:     General: There is no distension.     Palpations: Abdomen is soft. There is no mass.  Musculoskeletal:        General: No swelling.  Lymphadenopathy:     Cervical: No cervical adenopathy.  Skin:    General: Skin is warm.  Neurological:     General: No focal deficit present.     Mental Status: She is alert and oriented to person, place, and time.  Psychiatric:        Mood and Affect: Mood normal.        Behavior: Behavior normal.      LABORATORY DATA:  I have reviewed the labs as listed.  CBC    Component Value Date/Time   WBC 13.8 (H) 03/12/2019 1357   RBC 5.40 (H) 03/12/2019 1357   RBC 5.40 (H) 03/12/2019 1357   HGB 16.3 (H) 03/12/2019 1357   HGB 15.8 02/23/2019 1513   HCT 51.3 (H) 03/12/2019 1357   HCT 47.8 (H) 02/23/2019 1513   PLT 226 03/12/2019 1357   PLT 217 02/23/2019 1513   MCV 95.0 03/12/2019 1357   MCV 92 02/23/2019 1513   MCH 30.2 03/12/2019 1357   MCHC 31.8 03/12/2019 1357   RDW 13.3 03/12/2019 1357   RDW 12.5 02/23/2019 1513   LYMPHSABS 7.1 (H) 03/12/2019 1357   LYMPHSABS 7.7 (H) 02/23/2019 1513   MONOABS 1.0 03/12/2019 1357   EOSABS 0.5 03/12/2019 1357   EOSABS 0.3 02/23/2019 1513   BASOSABS 0.1 03/12/2019 1357   BASOSABS 0.1 02/23/2019 1513   CMP Latest Ref Rng & Units 03/19/2019 02/23/2019 02/13/2019  Glucose 65 - 99 mg/dL 146(H) 108(H) -  BUN 8 - 27 mg/dL 24 14 -  Creatinine 0.57 - 1.00 mg/dL 1.21(H) 1.32(H) 1.20(H)  Sodium 134 -  144 mmol/L 145(H) 143 -   Potassium 3.5 - 5.2 mmol/L 4.6 5.2 -  Chloride 96 - 106 mmol/L 101 103 -  CO2 20 - 29 mmol/L 24 27 -  Calcium 8.7 - 10.3 mg/dL 9.9 9.8 -  Total Protein 6.0 - 8.5 g/dL - 6.2 -  Total Bilirubin 0.0 - 1.2 mg/dL - 0.7 -  Alkaline Phos 39 - 117 IU/L - 82 -  AST 0 - 40 IU/L - 21 -  ALT 0 - 32 IU/L - 8 -       DIAGNOSTIC IMAGING:  I have independently reviewed the scans and discussed with the patient.   I have reviewed Venita Lick LPN's note and agree with the documentation.  I personally performed a face-to-face visit, made revisions and my assessment and plan is as follows.    ASSESSMENT & PLAN:   CLL (chronic lymphocytic leukemia) ([facility]) 1.  CLL, IGH V mutated, Tp53 negative: - She had elevated white count since 2009.  No fevers or night sweats.  Almost 20 pound weight loss in the last 2 months. - Peripheral blood flow cytometry was consistent with CLL. - Physical exam did not show any splenomegaly or lymphadenopathy. -CT CAP on 04/01/2019 showed mild mediastinal and bilateral hilar adenopathy measuring up to 11 mm.  Noncalcified pulmonary nodule seen in the right upper lobe. - CLL FISH panel showed trisomy 12 and 13 q. deletion. - She reported improvement in her appetite since we started her on Marinol.  At this time we would wait and watch.  I will see her back in 4 weeks.  If she does continues to lose weight, I will consider starting therapy with newer agents like ibrutinib.  2.  Weight loss: - 18-20 pound weight loss in the last 2 months since the tick bite. -Lyme disease and RMSF antibody panel was negative.  Alpha gal panel showed low positivity for beef and pork IgE. - We have started her on Marinol 2.5 mg twice daily at last visit on 04/07/2019. -She reported improvement in her appetite.  She gained 1 pound since last visit. -We will reevaluate her in 4 weeks.  3.  Health maintenance: -Last colonoscopy was in October 2003 which showed normal rectum and diverticular  disease.  4.  CKD: -He had mild CKD since February 2016.   Total time spent is 25 minutes with more than 50% of the time spent face-to-face discussing lab results, surveillance, counseling and coordination of care.  Orders placed this encounter:  No orders of the defined types were placed in this encounter.     Derek Jack, MD [facility] [phone]

## 2019-04-23 NOTE — Patient Instructions (Addendum)
[facility] Discharge Instructions  You were seen today by Dr. Katragadda. He went over your recent lab results. He will see you back in 4 weeks for labs and follow up.   Thank you for choosing [facility] at [facility] to provide your oncology and hematology care.  To afford each patient quality time with our provider, please arrive at least 15 minutes before your scheduled appointment time.   If you have a lab appointment with the Cancer Center please come in thru the  Main Entrance and check in at the main information desk  You need to re-schedule your appointment should you arrive 10 or more minutes late.  We strive to give you quality time with our providers, and arriving late affects you and other patients whose appointments are after yours.  Also, if you no show three or more times for appointments you may be dismissed from the clinic at the providers discretion.     Again, thank you for choosing [facility].  Our hope is that these requests will decrease the amount of time that you wait before being seen by our physicians.       _____________________________________________________________  Should you have questions after your visit to [facility], please contact our office at [phone] between the hours of 8:00 a.m. and 4:30 p.m.  Voicemails left after 4:00 p.m. will not be returned until the following business day.  For prescription refill requests, have your pharmacy contact our office and allow 72 hours.    Cancer Center Support Programs:   > Cancer Support Group  2nd Tuesday of the month 1pm-2pm, Journey Room    

## 2019-05-15 ENCOUNTER — Other Ambulatory Visit (HOSPITAL_COMMUNITY): Payer: Self-pay | Admitting: *Deleted

## 2019-05-15 DIAGNOSIS — D72829 Elevated white blood cell count, unspecified: Secondary | ICD-10-CM

## 2019-05-15 DIAGNOSIS — C9110 Chronic lymphocytic leukemia of B-cell type not having achieved remission: Secondary | ICD-10-CM

## 2019-05-18 ENCOUNTER — Inpatient Hospital Stay (HOSPITAL_COMMUNITY): Payer: Medicare HMO

## 2019-05-21 ENCOUNTER — Ambulatory Visit (HOSPITAL_COMMUNITY): Payer: Medicare HMO | Admitting: Hematology

## 2019-07-28 ENCOUNTER — Other Ambulatory Visit: Payer: Self-pay | Admitting: Family

## 2019-07-28 DIAGNOSIS — F411 Generalized anxiety disorder: Secondary | ICD-10-CM

## 2019-07-28 DIAGNOSIS — F3342 Major depressive disorder, recurrent, in full remission: Secondary | ICD-10-CM

## 2019-09-22 ENCOUNTER — Encounter: Payer: Self-pay | Admitting: Family

## 2019-09-22 ENCOUNTER — Ambulatory Visit (INDEPENDENT_AMBULATORY_CARE_PROVIDER_SITE_OTHER): Payer: Medicare HMO | Admitting: Family

## 2019-09-22 ENCOUNTER — Other Ambulatory Visit: Payer: Self-pay

## 2019-09-22 VITALS — BP 131/83 | HR 78 | Temp 96.8°F | Ht 64.0 in | Wt 118.0 lb

## 2019-09-22 DIAGNOSIS — L089 Local infection of the skin and subcutaneous tissue, unspecified: Secondary | ICD-10-CM | POA: Diagnosis not present

## 2019-09-22 DIAGNOSIS — Z9181 History of falling: Secondary | ICD-10-CM

## 2019-09-22 DIAGNOSIS — T148XXA Other injury of unspecified body region, initial encounter: Secondary | ICD-10-CM

## 2019-09-22 DIAGNOSIS — F3342 Major depressive disorder, recurrent, in full remission: Secondary | ICD-10-CM

## 2019-09-22 DIAGNOSIS — S81812A Laceration without foreign body, left lower leg, initial encounter: Secondary | ICD-10-CM | POA: Diagnosis not present

## 2019-09-22 MED ORDER — CEPHALEXIN 500 MG PO CAPS: 500.0000 mg | ORAL_CAPSULE | Freq: Three times a day (TID) | ORAL | 0 refills | Status: DC

## 2019-09-22 MED ORDER — VENLAFAXINE HCL ER 37.5 MG PO CP24: 37.5000 mg | ORAL_CAPSULE | Freq: Every day | ORAL | 2 refills | Status: DC

## 2019-09-22 NOTE — Progress Notes (Signed)
Subjective:    Patient ID: [name] Williams, female    DOB: [birth year], 86 y.o.   MRN: [id number]  Chief Complaint  Patient presents with  . laceration on right wrist and left, lower leg  . Depression   PT presents to the office today with complaints of a fall last week and injured her right hand and left lower leg. She states she had a "flap" of skin on her leg and thought it would heal better if she cut it off. However, since the fall the area is red, draining yellow, and slightly warm. Denies any pain or tenderness.  Depression        This is a chronic problem.  The onset quality is gradual.   The problem occurs constantly.  The problem has been waxing and waning since onset.  Associated symptoms include fatigue, helplessness, hopelessness, irritable, restlessness, decreased interest and sad.  Past treatments include nothing.  Compliance with treatment is good. Fall The accident occurred 5 to 7 days ago. The point of impact was the right wrist (left leg).      Review of Systems  Constitutional: Positive for fatigue.  Psychiatric/Behavioral: Positive for depression.  All other systems reviewed and are negative.      Objective:   Physical Exam Vitals reviewed.  Constitutional:      General: She is irritable. She is not in acute distress.    Appearance: She is well-developed.  HENT:     Head: Normocephalic and atraumatic.  Eyes:     Pupils: Pupils are equal, round, and reactive to light.  Neck:     Thyroid: No thyromegaly.  Cardiovascular:     Rate and Rhythm: Normal rate and regular rhythm.     Heart sounds: Normal heart sounds. No murmur.  Pulmonary:     Effort: Pulmonary effort is normal. No respiratory distress.     Breath sounds: Normal breath sounds. No wheezing.  Abdominal:     General: Bowel sounds are normal. There is no distension.     Palpations: Abdomen is soft.     Tenderness: There is no abdominal tenderness.  Musculoskeletal:        General: No tenderness.  Normal range of motion.     Cervical back: Normal range of motion and neck supple.  Skin:    General: Skin is warm and dry.     Comments: Left lower leg skin tear approx 3.5X4.3 cm  Right hand skin tear with a V shape tear that is approx 3.5X0.5cm, with yellow discharge and warmth present   Neurological:     Mental Status: She is alert and oriented to person, place, and time.     Cranial Nerves: No cranial nerve deficit.     Deep Tendon Reflexes: Reflexes are normal and symmetric.  Psychiatric:        Behavior: Behavior normal.        Thought Content: Thought content normal.        Judgment: Judgment normal.    Area cleaned and Vaseline dressing applied           Assessment & Plan:  Cloretta Ned Belisle comes in today with chief complaint of laceration on right wrist and left, lower leg and Depression   Diagnosis and orders addressed:  1. Infected skin tear Keep clean and dry Will start Keflex today  Report any increased s/s of infection - cephALEXin (KEFLEX) 500 MG capsule; Take 1 capsule (500 mg total) by mouth 3 (three) times daily.  Dispense: 21 capsule; Refill: 0  2. Skin tear of left lower leg without complication, initial encounter - cephALEXin (KEFLEX) 500 MG capsule; Take 1 capsule (500 mg total) by mouth 3 (three) times daily.  Dispense: 21 capsule; Refill: 0  3. Recurrent major depressive disorder, in full remission ([place]) Start Effexor 37.5 mg today Stress management  RTO in 4 weeks to recheck  - venlafaxine XR (EFFEXOR XR) 37.5 MG 24 hr capsule; Take 1 capsule (37.5 mg total) by mouth daily with breakfast.  Dispense: 30 capsule; Refill: 2  4. At high risk for falls Fall precautions discussed    Evelina Dun, FNP

## 2019-09-22 NOTE — Patient Instructions (Signed)
Skin Tear A skin tear is a wound in which the top layers of skin have peeled off from the deeper skin or tissues underneath. This is a common problem as people get older because the skin becomes thinner and more fragile. In addition, some medicines, such as oral corticosteroids, can lead to thinning skin if they are taken for long periods of time. A skin tear is often repaired with tape or skin adhesive strips. Depending on the location of the wound, a bandage (dressing) may be applied over the tape or skin adhesive strips. Follow these instructions at home: Wound care   Clean the wound as told by your health care provider. You may be instructed to keep the wound dry for the first few days. If you are told to clean the wound: ? Wash the wound as told by your health care provider. This may include using mild soap and water, a wound cleanser, or a salt-water (saline) solution. ? If using soap, rinse the wound with water to remove all soap. ? Do not rub the wound dry. Pat it gently or let it air dry. ? Keep the dressing dry as told by your health care provider.  Change any dressings as told by your health care provider. This may include changing the dressing if it gets wet, gets dirty, or starts to smell bad. ? Wash your hands with soap and water before and after you change your bandage (dressing). If soap and water are not available, use hand sanitizer. ? Leave tape or skin adhesive strips in place. These skin closures may need to stay in place for 2 weeks or longer. If adhesive strip edges start to loosen and curl up, you may trim the loose edges. Do not remove adhesive strips completely unless your health care provider tells you to do that.  Check your wound every day for signs of infection. Check for: ? Redness, swelling, or pain. ? More fluid or blood. ? Warmth. ? Pus or a bad smell.  Do not scratch or pick at the wound.  Protect the injured area until it has healed. Medicines  Take or  apply over-the-counter and prescription medicines only as told by your health care provider.  If you were prescribed an antibiotic medicine, take or apply it as told by your health care provider. Do not stop using the antibiotic even if your condition improves. General instructions   Keep the dressing dry as told by your health care provider.  Do not take baths, swim, use a hot tub, or do anything that puts your wound underwater until your health care provider approves. Ask your health care provider if you may take showers. You may only be allowed to take sponge baths.  Keep all follow-up visits as told by your health care provider. This is important. Contact a health care provider if:  You have redness, swelling, or pain around your wound.  You have more fluid or blood coming from your wound.  Your wound, or the area around your wound, feels warm to the touch.  You have pus or a bad smell coming from your wound. Get help right away if:  You have a red streak that goes away from the skin tear.  You have a fever and chills and your symptoms suddenly get worse. Summary  A skin tear is a wound in which the top layers of skin have peeled off from the deeper skin or tissues underneath.  A skin tear is often repaired with tape   or skin adhesive strips, and a bandage (dressing) may be applied over the tape or skin adhesive strips.  Change any dressings as told by your health care provider.  Take or apply over-the-counter and prescription medicines only as told by your health care provider.  Contact a health care provider if you have signs of infection. This information is not intended to replace advice given to you by your health care provider. Make sure you discuss any questions you have with your health care provider. Document Revised: 05/27/2018 Document Reviewed: 05/27/2018 Elsevier Patient Education  2020 [employer]  

## 2019-10-14 ENCOUNTER — Other Ambulatory Visit: Payer: Self-pay | Admitting: Family

## 2019-10-14 DIAGNOSIS — F3342 Major depressive disorder, recurrent, in full remission: Secondary | ICD-10-CM

## 2019-10-14 NOTE — Telephone Encounter (Signed)
Na/ww patient needs a 4 week follow up with Evelina Dun, FNP.

## 2019-10-30 ENCOUNTER — Telehealth: Payer: Self-pay | Admitting: Family

## 2019-10-30 NOTE — Chronic Care Management (AMB) (Signed)
  Chronic Care Management   Note  10/30/2019 Name: [name] Williams MRN: [id number] DOB: [birth year]  Cloretta Ned Hott is a 86 y.o. year old female who is a primary care patient of Sharion Balloon, FNP. I reached out to [employer] by phone today in response to a referral sent by Ms. Cloretta Ned Hausen's health plan.     Ms. Maffeo was given information about Chronic Care Management services today including:  1. CCM service includes personalized support from designated clinical staff supervised by her physician, including individualized plan of care and coordination with other care providers 2. 24/7 contact phone numbers for assistance for urgent and routine care needs. 3. Service will only be billed when office clinical staff spend 20 minutes or more in a month to coordinate care. 4. Only one practitioner may furnish and bill the service in a calendar month. 5. The patient may stop CCM services at any time (effective at the end of the month) by phone call to the office staff. 6. The patient will be responsible for cost sharing (co-pay) of up to 20% of the service fee (after annual deductible is met).  Patient did not agree to enrollment in care management services and does not wish to consider at this time.  Follow up plan: The patient has been provided with contact information for the care management team and has been advised to call with any health related questions or concerns.   [place], [place] [address] Direct Dial: [phone] [email or web address]: [email or web address]

## 2019-11-03 ENCOUNTER — Other Ambulatory Visit: Payer: Self-pay

## 2019-11-03 ENCOUNTER — Encounter: Payer: Self-pay | Admitting: Family

## 2019-11-03 ENCOUNTER — Ambulatory Visit (INDEPENDENT_AMBULATORY_CARE_PROVIDER_SITE_OTHER): Payer: Medicare Other | Admitting: Family

## 2019-11-03 VITALS — BP 116/71 | HR 71 | Temp 97.1°F | Ht 64.0 in | Wt 121.0 lb

## 2019-11-03 DIAGNOSIS — F411 Generalized anxiety disorder: Secondary | ICD-10-CM | POA: Diagnosis not present

## 2019-11-03 DIAGNOSIS — G4700 Insomnia, unspecified: Secondary | ICD-10-CM

## 2019-11-03 DIAGNOSIS — C9110 Chronic lymphocytic leukemia of B-cell type not having achieved remission: Secondary | ICD-10-CM

## 2019-11-03 DIAGNOSIS — F3342 Major depressive disorder, recurrent, in full remission: Secondary | ICD-10-CM | POA: Diagnosis not present

## 2019-11-03 MED ORDER — VENLAFAXINE HCL ER 75 MG PO CP24: 75.0000 mg | ORAL_CAPSULE | Freq: Every day | ORAL | 1 refills | Status: DC

## 2019-11-03 MED ORDER — VENLAFAXINE HCL ER 37.5 MG PO CP24: ORAL_CAPSULE | ORAL | 2 refills | Status: DC

## 2019-11-03 NOTE — Patient Instructions (Signed)

## 2019-11-03 NOTE — Progress Notes (Signed)
Subjective:    Patient ID: [name] Williams, female    DOB: October 10, 1927, 86 y.o.   MRN: [id number]    Chief Complaint  Patient presents with  . Weight Loss    wants something to help her gain weight    Pt presents to the office with weight loss and decrease in appetite. However, per Epic she has gained 3 lbs since our last visit.   She reports she has to force herself to eat her food.   She has CLL and is followed by Oncologists, but has not seen them since 04/22/2019. Depression        This is a chronic problem.  The current episode started more than 1 year ago.   The onset quality is gradual.   The problem occurs intermittently.  Associated symptoms include helplessness, hopelessness, insomnia, irritable, restlessness, decreased interest and sad.  Past treatments include SNRIs - Serotonin and norepinephrine reuptake inhibitors.  Past medical history includes anxiety.   Anxiety Presents for follow-up visit. Symptoms include depressed mood, excessive worry, insomnia, nervous/anxious behavior, panic and restlessness. Symptoms occur most days. The severity of symptoms is moderate.    Insomnia Primary symptoms: difficulty falling asleep, frequent awakening.  The current episode started more than one year. The onset quality is gradual. PMH includes: depression.      Review of Systems  Psychiatric/Behavioral: Positive for depression. The patient is nervous/anxious and has insomnia.   All other systems reviewed and are negative.      Objective:   Physical Exam Vitals reviewed.  Constitutional:      General: She is irritable. She is not in acute distress.    Appearance: She is well-developed.  HENT:     Head: Normocephalic and atraumatic.     Right Ear: Tympanic membrane normal.     Left Ear: Tympanic membrane normal.  Eyes:     Pupils: Pupils are equal, round, and reactive to light.  Neck:     Thyroid: No thyromegaly.  Cardiovascular:     Rate and Rhythm: Normal rate and  regular rhythm.     Heart sounds: Normal heart sounds. No murmur.  Pulmonary:     Effort: Pulmonary effort is normal. No respiratory distress.     Breath sounds: Normal breath sounds. No wheezing.  Abdominal:     General: Bowel sounds are normal. There is no distension.     Palpations: Abdomen is soft.     Tenderness: There is no abdominal tenderness.  Musculoskeletal:        General: No tenderness. Normal range of motion.     Cervical back: Normal range of motion and neck supple.     Right lower leg: Edema present.     Left lower leg: Edema present.  Skin:    General: Skin is warm and dry.  Neurological:     Mental Status: She is alert and oriented to person, place, and time.     Cranial Nerves: No cranial nerve deficit.     Deep Tendon Reflexes: Reflexes are normal and symmetric.  Psychiatric:        Behavior: Behavior normal.        Thought Content: Thought content normal.        Judgment: Judgment normal.     BP 116/71   Pulse 71   Temp (!) 97.1 F (36.2 C) (Temporal)   Ht _0  (1.626 m)   Wt 121 lb (54.9 kg)   SpO2 97%   BMI 20.77 kg/m  Assessment & Plan:  [name] Williams comes in today with chief complaint of Weight Loss (wants something to help her gain weight )   Diagnosis and orders addressed:  1. CLL (chronic lymphocytic leukemia) (HCC) - CMP14+EGFR - CBC with Differential/Platelet  2. GAD (generalized anxiety disorder) Will restart Effexor today at 37.5 mg for 2 weeks then increase 75 mg Stress management - CMP14+EGFR - CBC with Differential/Platelet - venlafaxine XR (EFFEXOR XR) 37.5 MG 24 hr capsule; Take 1 capsule (37.5 mg total) by mouth daily with breakfast for 14 days, THEN 2 capsules (75 mg total) daily with breakfast for 14 days.  Dispense: 30 capsule; Refill: 2 - venlafaxine XR (EFFEXOR XR) 75 MG 24 hr capsule; Take 1 capsule (75 mg total) by mouth daily with breakfast.  Dispense: 90 capsule; Refill: 1  3. Recurrent major depressive  disorder, in full remission ([place]) - CMP14+EGFR - CBC with Differential/Platelet - venlafaxine XR (EFFEXOR XR) 37.5 MG 24 hr capsule; Take 1 capsule (37.5 mg total) by mouth daily with breakfast for 14 days, THEN 2 capsules (75 mg total) daily with breakfast for 14 days.  Dispense: 30 capsule; Refill: 2 - venlafaxine XR (EFFEXOR XR) 75 MG 24 hr capsule; Take 1 capsule (75 mg total) by mouth daily with breakfast.  Dispense: 90 capsule; Refill: 1  4. Insomnia, unspecified type - CMP14+EGFR - CBC with Differential/Platelet   Labs pending Health Maintenance reviewed Diet and exercise encouraged  Follow up plan: 6 weeks to recheck GAD and Depression  Pt told to call Oncologist and make follow up appt   Evelina Dun, FNP

## 2019-11-04 LAB — CMP14+EGFR
ALT: 8 IU/L (ref 0–32)
AST: 19 IU/L (ref 0–40)
Albumin/Globulin Ratio: 2 (ref 1.2–2.2)
Albumin: 4.1 g/dL (ref 3.6–4.6)
Alkaline Phosphatase: 104 IU/L (ref 39–117)
BUN/Creatinine Ratio: 18 (ref 12–28)
BUN: 21 mg/dL (ref 8–27)
Bilirubin Total: 0.5 mg/dL (ref 0.0–1.2)
CO2: 28 mmol/L (ref 20–29)
Calcium: 9.1 mg/dL (ref 8.7–10.3)
Chloride: 101 mmol/L (ref 96–106)
Creatinine, Ser: 1.15 mg/dL — ABNORMAL HIGH (ref 0.57–1.00)
GFR calc Af Amer: 49 mL/min/{1.73_m2} — ABNORMAL LOW (ref >59)
GFR calc non Af Amer: 42 mL/min/{1.73_m2} — ABNORMAL LOW (ref >59)
Globulin, Total: 2.1 g/dL (ref 1.5–4.5)
Glucose: 103 mg/dL — ABNORMAL HIGH (ref 65–99)
Potassium: 4.8 mmol/L (ref 3.5–5.2)
Sodium: 141 mmol/L (ref 134–144)
Total Protein: 6.2 g/dL (ref 6.0–8.5)

## 2019-11-04 LAB — CBC WITH DIFFERENTIAL/PLATELET
Basophils Absolute: 0.1 10*3/uL (ref 0.0–0.2)
Basos: 0 %
EOS (ABSOLUTE): 0.1 10*3/uL (ref 0.0–0.4)
Eos: 0 %
Hematocrit: 42.8 % (ref 34.0–46.6)
Hemoglobin: 14.3 g/dL (ref 11.1–15.9)
Immature Grans (Abs): 0 10*3/uL (ref 0.0–0.1)
Immature Granulocytes: 0 %
Lymphocytes Absolute: 8.6 10*3/uL — ABNORMAL HIGH (ref 0.7–3.1)
Lymphs: 60 %
MCH: 31.1 pg (ref 26.6–33.0)
MCHC: 33.4 g/dL (ref 31.5–35.7)
MCV: 93 fL (ref 79–97)
Monocytes Absolute: 0.9 10*3/uL (ref 0.1–0.9)
Monocytes: 6 %
Neutrophils Absolute: 4.8 10*3/uL (ref 1.4–7.0)
Neutrophils: 34 %
Platelets: 202 10*3/uL (ref 150–450)
RBC: 4.6 x10E6/uL (ref 3.77–5.28)
RDW: 12.5 % (ref 11.7–15.4)
WBC: 14.4 10*3/uL — ABNORMAL HIGH (ref 3.4–10.8)

## 2019-11-05 ENCOUNTER — Telehealth: Payer: Self-pay | Admitting: Family

## 2019-11-17 ENCOUNTER — Other Ambulatory Visit (HOSPITAL_COMMUNITY): Payer: Self-pay | Admitting: *Deleted

## 2019-11-17 ENCOUNTER — Other Ambulatory Visit: Payer: Self-pay | Admitting: Family

## 2019-11-17 DIAGNOSIS — F411 Generalized anxiety disorder: Secondary | ICD-10-CM

## 2019-11-17 DIAGNOSIS — C9110 Chronic lymphocytic leukemia of B-cell type not having achieved remission: Secondary | ICD-10-CM

## 2019-11-19 ENCOUNTER — Other Ambulatory Visit: Payer: Self-pay | Admitting: Family

## 2019-11-19 DIAGNOSIS — F3342 Major depressive disorder, recurrent, in full remission: Secondary | ICD-10-CM

## 2019-11-19 DIAGNOSIS — F411 Generalized anxiety disorder: Secondary | ICD-10-CM

## 2019-11-24 ENCOUNTER — Telehealth: Payer: Self-pay | Admitting: Family

## 2019-11-24 NOTE — Telephone Encounter (Signed)
Ok

## 2019-11-25 ENCOUNTER — Inpatient Hospital Stay (HOSPITAL_COMMUNITY): Payer: Medicare Other | Admitting: Nurse Practitioner

## 2019-11-25 ENCOUNTER — Other Ambulatory Visit: Payer: Self-pay

## 2019-11-25 ENCOUNTER — Inpatient Hospital Stay (HOSPITAL_COMMUNITY): Payer: Medicare Other | Attending: Hematology

## 2019-11-25 ENCOUNTER — Other Ambulatory Visit (HOSPITAL_COMMUNITY): Payer: Medicare HMO

## 2019-11-25 ENCOUNTER — Ambulatory Visit (HOSPITAL_COMMUNITY): Payer: Medicare HMO | Admitting: Hematology

## 2019-11-25 DIAGNOSIS — Z7982 Long term (current) use of aspirin: Secondary | ICD-10-CM | POA: Diagnosis not present

## 2019-11-25 DIAGNOSIS — N189 Chronic kidney disease, unspecified: Secondary | ICD-10-CM | POA: Insufficient documentation

## 2019-11-25 DIAGNOSIS — Z87891 Personal history of nicotine dependence: Secondary | ICD-10-CM | POA: Diagnosis not present

## 2019-11-25 DIAGNOSIS — Z803 Family history of malignant neoplasm of breast: Secondary | ICD-10-CM | POA: Insufficient documentation

## 2019-11-25 DIAGNOSIS — K219 Gastro-esophageal reflux disease without esophagitis: Secondary | ICD-10-CM | POA: Insufficient documentation

## 2019-11-25 DIAGNOSIS — Z800 Family history of malignant neoplasm of digestive organs: Secondary | ICD-10-CM | POA: Insufficient documentation

## 2019-11-25 DIAGNOSIS — F419 Anxiety disorder, unspecified: Secondary | ICD-10-CM | POA: Insufficient documentation

## 2019-11-25 DIAGNOSIS — Z8673 Personal history of transient ischemic attack (TIA), and cerebral infarction without residual deficits: Secondary | ICD-10-CM | POA: Insufficient documentation

## 2019-11-25 DIAGNOSIS — C9110 Chronic lymphocytic leukemia of B-cell type not having achieved remission: Secondary | ICD-10-CM

## 2019-11-25 DIAGNOSIS — I10 Essential (primary) hypertension: Secondary | ICD-10-CM | POA: Diagnosis not present

## 2019-11-25 DIAGNOSIS — F3342 Major depressive disorder, recurrent, in full remission: Secondary | ICD-10-CM

## 2019-11-25 DIAGNOSIS — Z8049 Family history of malignant neoplasm of other genital organs: Secondary | ICD-10-CM | POA: Insufficient documentation

## 2019-11-25 DIAGNOSIS — F329 Major depressive disorder, single episode, unspecified: Secondary | ICD-10-CM | POA: Diagnosis not present

## 2019-11-25 DIAGNOSIS — Z79899 Other long term (current) drug therapy: Secondary | ICD-10-CM | POA: Insufficient documentation

## 2019-11-25 DIAGNOSIS — E785 Hyperlipidemia, unspecified: Secondary | ICD-10-CM | POA: Insufficient documentation

## 2019-11-25 DIAGNOSIS — F411 Generalized anxiety disorder: Secondary | ICD-10-CM

## 2019-11-25 LAB — COMPREHENSIVE METABOLIC PANEL
ALT: 15 U/L (ref 0–44)
AST: 23 U/L (ref 15–41)
Albumin: 3.9 g/dL (ref 3.5–5.0)
Alkaline Phosphatase: 83 U/L (ref 38–126)
Anion gap: 7 (ref 5–15)
BUN: 14 mg/dL (ref 8–23)
CO2: 32 mmol/L (ref 22–32)
Calcium: 9.3 mg/dL (ref 8.9–10.3)
Chloride: 103 mmol/L (ref 98–111)
Creatinine, Ser: 1.22 mg/dL — ABNORMAL HIGH (ref 0.44–1.00)
GFR calc Af Amer: 45 mL/min — ABNORMAL LOW (ref >60)
GFR calc non Af Amer: 39 mL/min — ABNORMAL LOW (ref >60)
Glucose, Bld: 90 mg/dL (ref 70–99)
Potassium: 4.2 mmol/L (ref 3.5–5.1)
Sodium: 142 mmol/L (ref 135–145)
Total Bilirubin: 0.8 mg/dL (ref 0.3–1.2)
Total Protein: 7 g/dL (ref 6.5–8.1)

## 2019-11-25 LAB — CBC WITH DIFFERENTIAL/PLATELET
Abs Immature Granulocytes: 0.03 10*3/uL (ref 0.00–0.07)
Basophils Absolute: 0.1 10*3/uL (ref 0.0–0.1)
Basophils Relative: 1 %
Eosinophils Absolute: 0.1 10*3/uL (ref 0.0–0.5)
Eosinophils Relative: 1 %
HCT: 47 % — ABNORMAL HIGH (ref 36.0–46.0)
Hemoglobin: 14.8 g/dL (ref 12.0–15.0)
Immature Granulocytes: 0 %
Lymphocytes Relative: 62 %
Lymphs Abs: 10.6 10*3/uL — ABNORMAL HIGH (ref 0.7–4.0)
MCH: 31.1 pg (ref 26.0–34.0)
MCHC: 31.5 g/dL (ref 30.0–36.0)
MCV: 98.7 fL (ref 80.0–100.0)
Monocytes Absolute: 1.3 10*3/uL — ABNORMAL HIGH (ref 0.1–1.0)
Monocytes Relative: 8 %
Neutro Abs: 4.6 10*3/uL (ref 1.7–7.7)
Neutrophils Relative %: 28 %
Platelets: 204 10*3/uL (ref 150–400)
RBC: 4.76 MIL/uL (ref 3.87–5.11)
RDW: 13.2 % (ref 11.5–15.5)
WBC: 16.8 10*3/uL — ABNORMAL HIGH (ref 4.0–10.5)
nRBC: 0 % (ref 0.0–0.2)

## 2019-11-25 LAB — LACTATE DEHYDROGENASE: LDH: 148 U/L (ref 98–192)

## 2019-11-25 MED ORDER — VENLAFAXINE HCL ER 37.5 MG PO CP24: 37.5000 mg | ORAL_CAPSULE | Freq: Every day | ORAL | 3 refills | Status: DC

## 2019-11-25 NOTE — Progress Notes (Signed)
[address], [place] [address]   CLINIC:  Medical Oncology/Hematology  PCP:  Sharion Balloon, [place] [id number] [phone]   REASON FOR VISIT: Follow-up for CLL  CURRENT THERAPY: Clinical surveillance   INTERVAL HISTORY:  [name] Williams 86 y.o. female returns for routine follow-up for CLL.  Patient reports she is doing well since her last visit.  She denies any B symptoms including night sweats, fevers, chills or unexplained weight loss.  She does report she has gained 1 pound since her last visit.  She reports appetite medication is working and her appetite has improved some.  She denies any bleeding.  She denies any new pains. Denies any nausea, vomiting, or diarrhea. Denies any new pains. Had not noticed any recent bleeding such as epistaxis, hematuria or hematochezia. Denies recent chest pain on exertion, shortness of breath on minimal exertion, pre-syncopal episodes, or palpitations. Denies any numbness or tingling in hands or feet. Denies any recent fevers, infections, or recent hospitalizations. Patient reports appetite at 25% and energy level at 75%.     REVIEW OF SYSTEMS:  Review of Systems  All other systems reviewed and are negative.    PAST MEDICAL/SURGICAL HISTORY:  Past Medical History:  Diagnosis Date  . Anxiety   . Depressive disorder, not elsewhere classified   . Esophageal reflux   . Essential hypertension, benign   . Hypertr obst cardiomyop   . Other and unspecified hyperlipidemia   . Stroke ([place])   . Unspecified transient cerebral ischemia    Past Surgical History:  Procedure Laterality Date  . BLADDER REPAIR    . CERVIX SURGERY    . CHOLECYSTECTOMY    . TONSILLECTOMY    . TUBAL LIGATION       SOCIAL HISTORY:  Social History   Socioeconomic History  . Marital status: Widowed    Spouse name: Not on file  . Number of children: 3  . Years of education: Not on file  . Highest  education level: 9th grade  Occupational History  . Not on file  Tobacco Use  . Smoking status: Former Research scientist (life sciences)  . Smokeless tobacco: Never Used  . Tobacco comment: quit 2012  Substance and Sexual Activity  . Alcohol use: No  . Drug use: No  . Sexual activity: Not Currently  Other Topics Concern  . Not on file  Social History Narrative   Patient is retired.    Patient is widowed.    Patient has 3 children.    Patient is right handed.    Pt lives with son.     Social Determinants of Health   Financial Resource Strain:   . Difficulty of Paying Living Expenses:   Food Insecurity:   . Worried About Charity fundraiser in the Last Year:   . Arboriculturist in the Last Year:   Transportation Needs:   . Film/video editor (Medical):   Marland Kitchen Lack of Transportation (Non-Medical):   Physical Activity:   . Days of Exercise per Week:   . Minutes of Exercise per Session:   Stress:   . Feeling of Stress :   Social Connections:   . Frequency of Communication with Friends and Family:   . Frequency of Social Gatherings with Friends and Family:   . Attends Religious Services:   . Active Member of Clubs or Organizations:   . Attends Archivist Meetings:   Marland Kitchen Marital Status:  Intimate Partner Violence:   . Fear of Current or Ex-Partner:   . Emotionally Abused:   Marland Kitchen Physically Abused:   . Sexually Abused:     FAMILY HISTORY:  Family History  Problem Relation Age of Onset  . Breast cancer Mother   . Stomach cancer Sister   . Uterine cancer Sister   . Depression Sister   . Cancer Brother   . Alcohol abuse Son     CURRENT MEDICATIONS:  Outpatient Encounter Medications as of 11/25/2019  Medication Sig Note  . mirtazapine (REMERON) 15 MG tablet TAKE 1 TABLET BY MOUTH EVERYDAY AT BEDTIME   . venlafaxine XR (EFFEXOR XR) 37.5 MG 24 hr capsule Take 1 capsule (37.5 mg total) by mouth daily with breakfast for 14 days, THEN 2 capsules (75 mg total) daily with breakfast for 14  days.   Marland Kitchen aspirin 81 MG EC tablet Take 81 mg by mouth every other day.  10/06/2014: Received from: [facility]  . venlafaxine XR (EFFEXOR-XR) 37.5 MG 24 hr capsule Take 1 capsule (37.5 mg total) by mouth daily with breakfast.   . [DISCONTINUED] venlafaxine XR (EFFEXOR XR) 75 MG 24 hr capsule Take 1 capsule (75 mg total) by mouth daily with breakfast. (Patient not taking: Reported on 11/25/2019)    No facility-administered encounter medications on file as of 11/25/2019.    ALLERGIES:  Allergies  Allergen Reactions  . Latex     Per pt 11-10-14 she do not remember being allergic to this.../or     PHYSICAL EXAM:  ECOG Performance status: 1  Vitals:   11/25/19 1103  BP: (!) 130/91  Pulse: 65  Resp: 18  Temp: (!) 96.9 F (36.1 C)  SpO2: 98%   Filed Weights   11/25/19 1103  Weight: 122 lb 4.8 oz (55.5 kg)    Physical Exam Constitutional:      Appearance: Normal appearance. She is normal weight.  Cardiovascular:     Rate and Rhythm: Normal rate and regular rhythm.     Heart sounds: Normal heart sounds.  Pulmonary:     Effort: Pulmonary effort is normal.     Breath sounds: Normal breath sounds.  Abdominal:     General: Bowel sounds are normal.     Palpations: Abdomen is soft.  Musculoskeletal:        General: Normal range of motion.  Skin:    General: Skin is warm.  Neurological:     Mental Status: She is alert and oriented to person, place, and time. Mental status is at baseline.  Psychiatric:        Mood and Affect: Mood normal.        Behavior: Behavior normal.        Thought Content: Thought content normal.        Judgment: Judgment normal.      LABORATORY DATA:  I have reviewed the labs as listed.  CBC    Component Value Date/Time   WBC 16.8 (H) 11/25/2019 0953   RBC 4.76 11/25/2019 0953   HGB 14.8 11/25/2019 0953   HGB 14.3 11/03/2019 1627   HCT 47.0 (H) 11/25/2019 0953   HCT 42.8 11/03/2019 1627   PLT 204 11/25/2019 0953   PLT 202 11/03/2019 1627    MCV 98.7 11/25/2019 0953   MCV 93 11/03/2019 1627   MCH 31.1 11/25/2019 0953   MCHC 31.5 11/25/2019 0953   RDW 13.2 11/25/2019 0953   RDW 12.5 11/03/2019 1627   LYMPHSABS 10.6 (H) 11/25/2019 0076  LYMPHSABS 8.6 (H) 11/03/2019 1627   MONOABS 1.3 (H) 11/25/2019 0953   EOSABS 0.1 11/25/2019 0953   EOSABS 0.1 11/03/2019 1627   BASOSABS 0.1 11/25/2019 0953   BASOSABS 0.1 11/03/2019 1627   CMP Latest Ref Rng & Units 11/25/2019 11/03/2019 03/19/2019  Glucose 70 - 99 mg/dL 90 103(H) 146(H)  BUN 8 - 23 mg/dL '14 21 24  '$ Creatinine 0.44 - 1.00 mg/dL 1.22(H) 1.15(H) 1.21(H)  Sodium 135 - 145 mmol/L 142 141 145(H)  Potassium 3.5 - 5.1 mmol/L 4.2 4.8 4.6  Chloride 98 - 111 mmol/L 103 101 101  CO2 22 - 32 mmol/L 32 28 24  Calcium 8.9 - 10.3 mg/dL 9.3 9.1 9.9  Total Protein 6.5 - 8.1 g/dL 7.0 6.2 -  Total Bilirubin 0.3 - 1.2 mg/dL 0.8 0.5 -  Alkaline Phos 38 - 126 U/L 83 104 -  AST 15 - 41 U/L 23 19 -  ALT 0 - 44 U/L 15 8 -     I personally performed a face-to-face visit,   All questions were answered to patient's stated satisfaction. Encouraged patient to call with any new concerns or questions before his next visit to the cancer center and we can certain see him sooner, if needed.     ASSESSMENT & PLAN:   CLL (chronic lymphocytic leukemia) (HCC) 1.  CLL, IGHV mutated, Tp53 negative: -She had elevated white count since 2009.  No fevers or night sweats.  Almost 20 pound weight loss in the last 2 months. -Peripheral blood flow cytometry was consistent with CLL. -Physical examination did not show any splenomegaly or lymphadenopathy. -CT CAP on 04/01/2019 showed mild mediastinal and bilateral hilar adenopathy measuring up to 11 mm.  Noncalcified pulmonary nodule seen in the right upper lobe. -CLL FISH panel showed trisomy 12 and 13 q. deletion. -She reported improvement in her appetite since we started her on Marinol. -At this time we will watch and wait. -Labs done on 11/25/2019 showed WBC of  16.8, hemoglobin 14.8, platelets 2 4, creatinine 1.22. -If she continues to lose weight we will consider starting therapy with the newer agents like ibrutinib. -She will follow-up in 2 month with repeat labs.  2.  Weight loss: -18 to 20 pound weight loss in the last 2 months since a tick bite. -Lyme disease and RMSF antibody panel was negative.  Alpha gal panel showed low positivity for beef and pork IgE. -We have started her on Marinol 2.5 mg twice daily on the last visit 04/07/2019. -She reported improvement in her appetite.  She gained 1 pound since her last visit. -She will follow-up in 2 month with repeat labs.  3.  CKD: -She had mild CKD since February 2016. -Labs done on 11/25/2019 showed creatinine 1.22  4.  Depression: -Patient reports she is taking Effexor 75 mg however this dose is affecting her eyes and wishes to be decreased to the 37.5 mg dose. -We will send her prescription to the pharmacy.  5.  Health maintenance: -Last colonoscopy was 05/2002 which showed normal rectum and diverticular disease.      Orders placed this encounter:  Orders Placed This Encounter  Procedures  . Lactate dehydrogenase  . CBC with Differential/Platelet  . Comprehensive metabolic panel  . Vitamin B12  . VITAMIN D 25 Hydroxy (Vit-D Deficiency, Fractures)  . Folate      Francene Finders, FNP-C [facility] [phone]

## 2019-11-25 NOTE — Assessment & Plan Note (Addendum)
1.  CLL, IGHV mutated, Tp53 negative: -She had elevated white count since 2009.  No fevers or night sweats.  Almost 20 pound weight loss in the last 2 months. -Peripheral blood flow cytometry was consistent with CLL. -Physical examination did not show any splenomegaly or lymphadenopathy. -CT CAP on 04/01/2019 showed mild mediastinal and bilateral hilar adenopathy measuring up to 11 mm.  Noncalcified pulmonary nodule seen in the right upper lobe. -CLL FISH panel showed trisomy 12 and 13 q. deletion. -She reported improvement in her appetite since we started her on Marinol. -At this time we will watch and wait. -Labs done on 11/25/2019 showed WBC of 16.8, hemoglobin 14.8, platelets 2 4, creatinine 1.22. -If she continues to lose weight we will consider starting therapy with the newer agents like ibrutinib. -She will follow-up in 2 month with repeat labs.  2.  Weight loss: -18 to 20 pound weight loss in the last 2 months since a tick bite. -Lyme disease and RMSF antibody panel was negative.  Alpha gal panel showed low positivity for beef and pork IgE. -We have started her on Marinol 2.5 mg twice daily on the last visit 04/07/2019. -She reported improvement in her appetite.  She gained 1 pound since her last visit. -She will follow-up in 2 month with repeat labs.  3.  CKD: -She had mild CKD since February 2016. -Labs done on 11/25/2019 showed creatinine 1.22  4.  Depression: -Patient reports she is taking Effexor 75 mg however this dose is affecting her eyes and wishes to be decreased to the 37.5 mg dose. -We will send her prescription to the pharmacy.  5.  Health maintenance: -Last colonoscopy was 05/2002 which showed normal rectum and diverticular disease.

## 2019-11-25 NOTE — Patient Instructions (Signed)
[facility] Discharge Instructions  Follow up in 2 months with labs    Thank you for choosing [facility] at [facility] to provide your oncology and hematology care.  To afford each patient quality time with our provider, please arrive at least 15 minutes before your scheduled appointment time.   If you have a lab appointment with the Cancer Center please come in thru the Main Entrance and check in at the main information desk.  You need to re-schedule your appointment should you arrive 10 or more minutes late.  We strive to give you quality time with our providers, and arriving late affects you and other patients whose appointments are after yours.  Also, if you no show three or more times for appointments you may be dismissed from the clinic at the providers discretion.     Again, thank you for choosing [facility].  Our hope is that these requests will decrease the amount of time that you wait before being seen by our physicians.       _____________________________________________________________  Should you have questions after your visit to [facility], please contact our office at [phone] between the hours of 8:00 a.m. and 4:30 p.m.  Voicemails left after 4:00 p.m. will not be returned until the following business day.  For prescription refill requests, have your pharmacy contact our office and allow 72 hours.    Due to Covid, you will need to wear a mask upon entering the hospital. If you do not have a mask, a mask will be given to you at the Main Entrance upon arrival. For doctor visits, patients may have 1 support person with them. For treatment visits, patients can not have anyone with them due to social distancing guidelines and our immunocompromised population.      

## 2019-11-30 IMAGING — MR MRI HEAD WITHOUT AND WITH CONTRAST
8 of 12 series · 31 of 48 positions shown · IV contrast (6ml gadavist)
Comparison: 03/31/2010

CLINICAL DATA: Ataxia. Weakness and confusion. Fell 4 weeks ago in
driveway.

EXAM:
MRI HEAD WITHOUT AND WITH CONTRAST
TECHNIQUE: Multiplanar, multiecho pulse sequences of the brain and surrounding
structures were obtained without and with intravenous contrast.
CONTRAST:  6 mL Gadavist

[Series 3: DWI · axial · 3.0mm · 0.72mm/px · z∈[-32,+117]mm · 6 of 51 slices shown (1 of 2)]
[im 1/51]
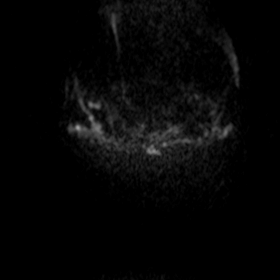
[im 11/51]
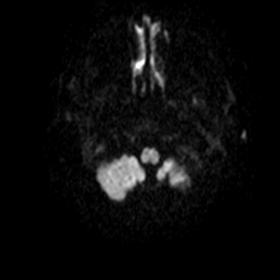
[im 21/51]
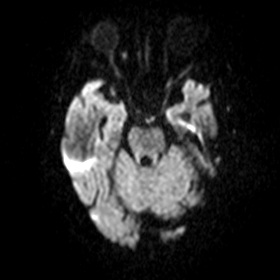
[im 31/51]
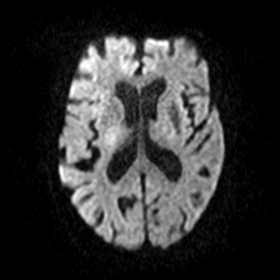
[im 41/51]
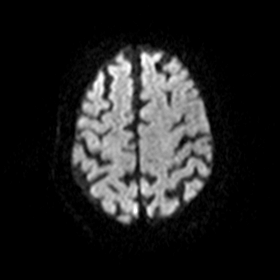
[im 51/51]
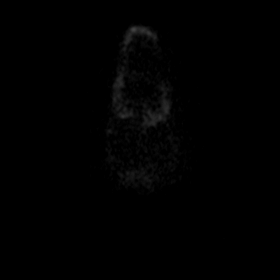

[Series 5: DWI · coronal · 5.0mm · 0.48mm/px · 3 of 34 slices shown (2 of 2)]
[im 1/34]
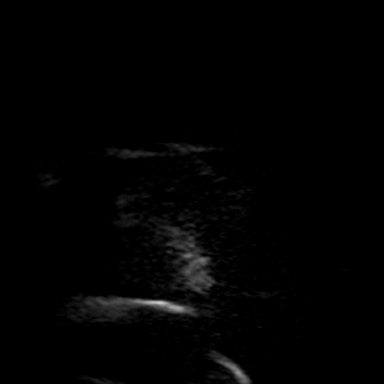
[im 17/34]
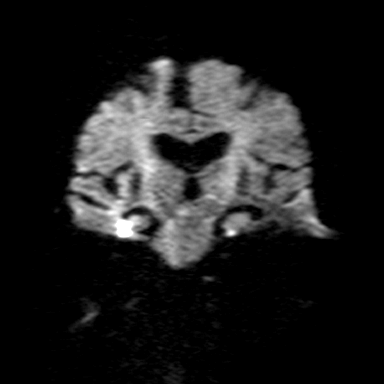
[im 34/34]
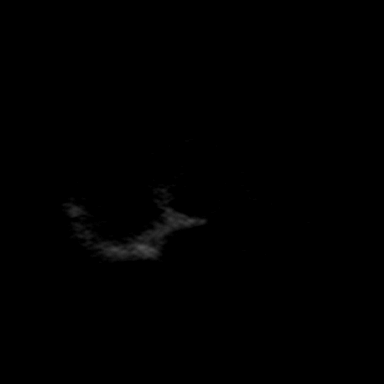

[Series 8: T2 · axial · 5.0mm · 0.47mm/px · z∈[-25,+118]mm · 2 of 23 slices shown (1 of 3)]
[im 1/23]
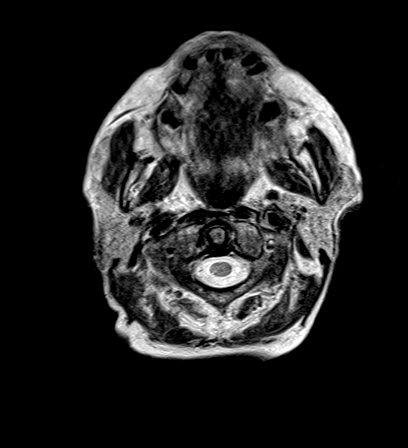
[im 23/23]
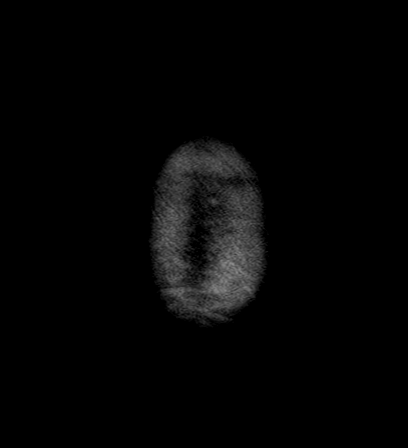

[Series 9: T2 · axial · 5.0mm · 0.41mm/px · z∈[-24,+118]mm · 2 of 23 slices shown (2 of 3)]
[im 1/23]
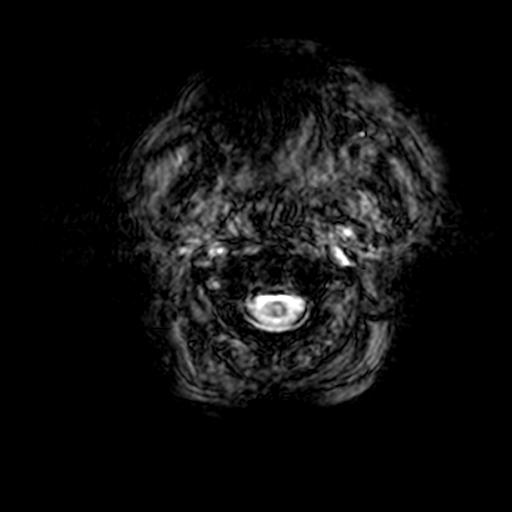
[im 23/23]
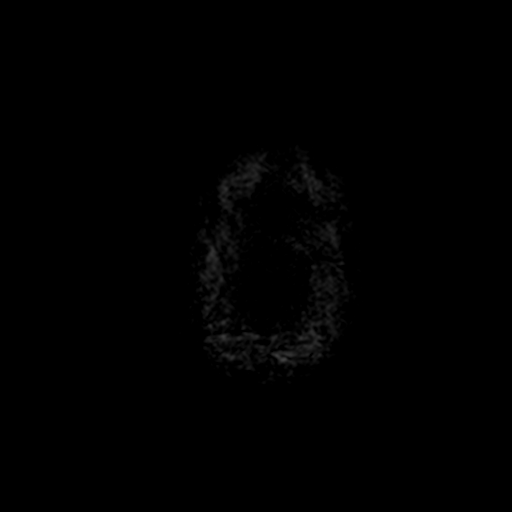

[Series 10: FLAIR · axial · 3.0mm · 0.83mm/px · z∈[-22,+115]mm · 5 of 47 slices shown]
[im 1/47]
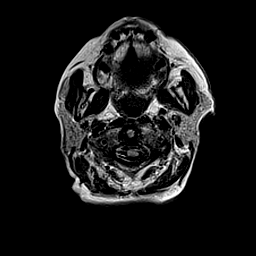
[im 12/47]
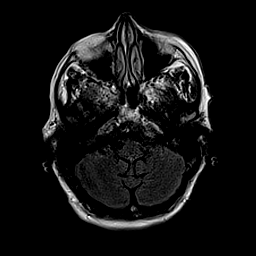
[im 24/47]
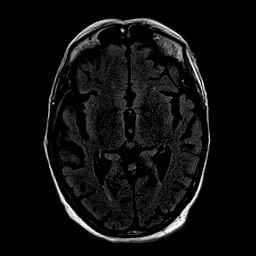
[im 35/47]
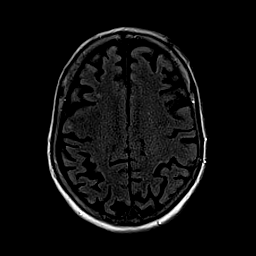
[im 47/47]
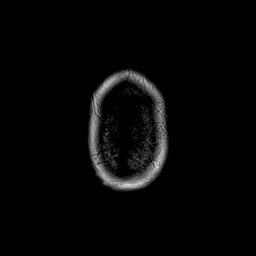

[Series 12: T2 · coronal · 5.0mm · 0.43mm/px · 3 of 28 slices shown (3 of 3)]
[im 1/28]
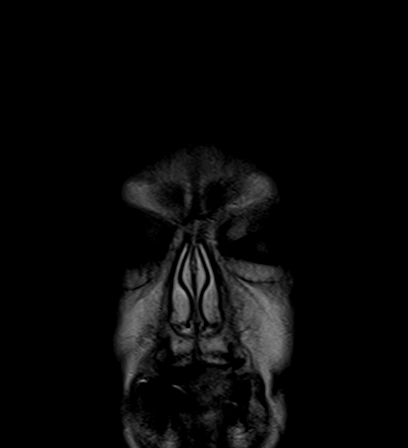
[im 14/28]
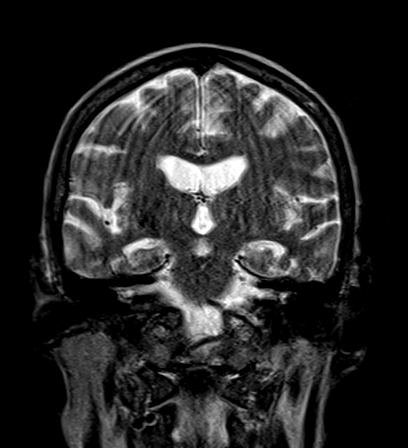
[im 28/28]
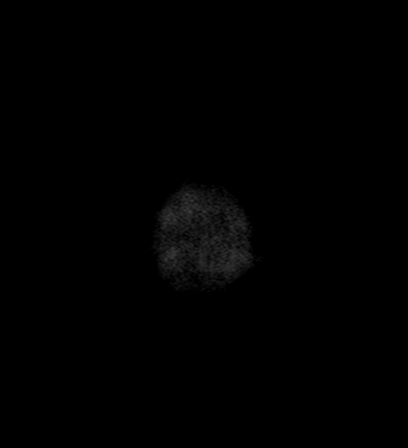

[Series 13: T1 post-contrast · axial · 2.0mm · 0.41mm/px · z∈[-20,+123]mm · 7 of 73 slices shown (1 of 2)]
[im 1/73]
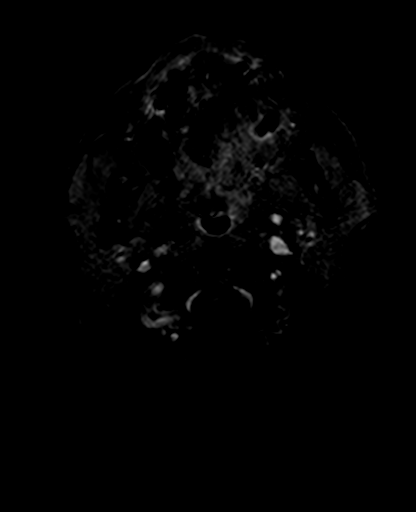
[im 13/73]
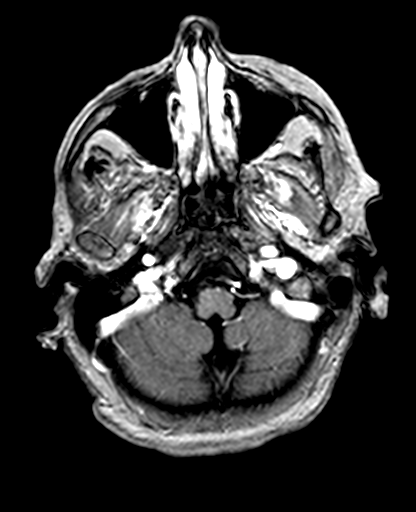
[im 25/73]
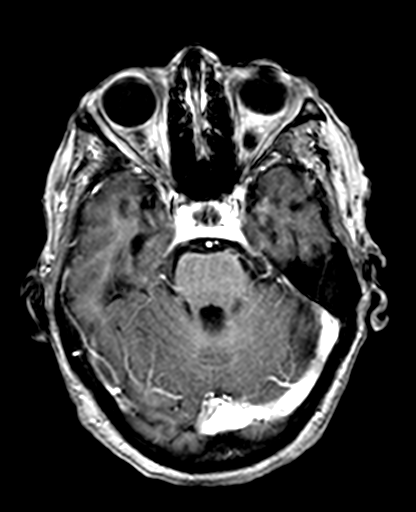
[im 37/73]
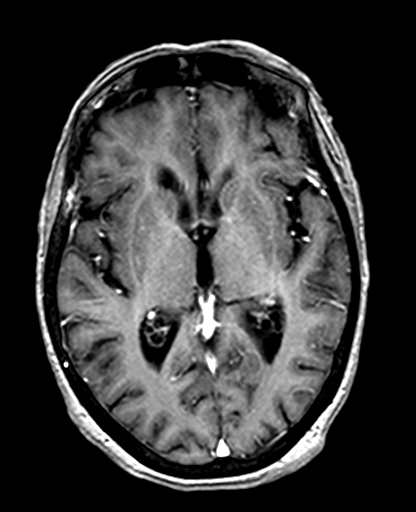
[im 49/73]
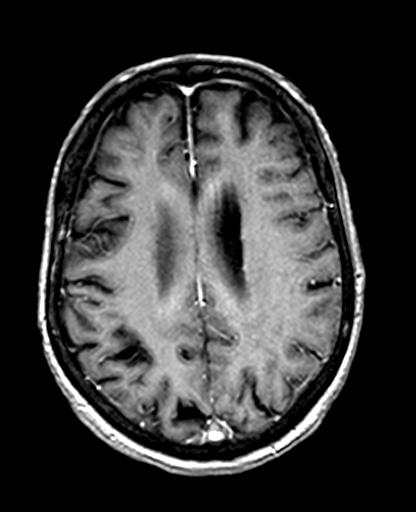
[im 61/73]
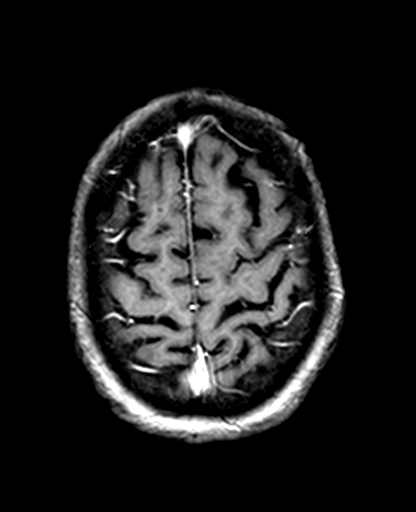
[im 73/73]
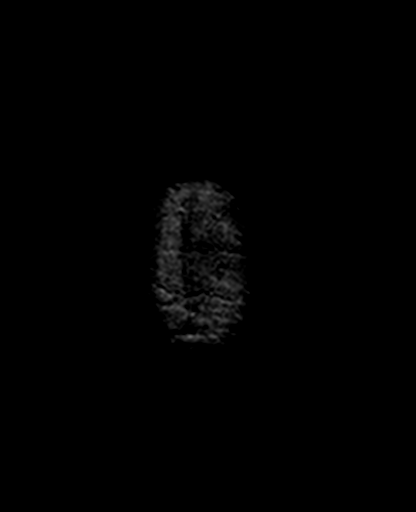

[Series 14: T1 post-contrast · coronal · 5.0mm · 0.36mm/px · 3 of 26 slices shown (2 of 2)]
[im 1/26]
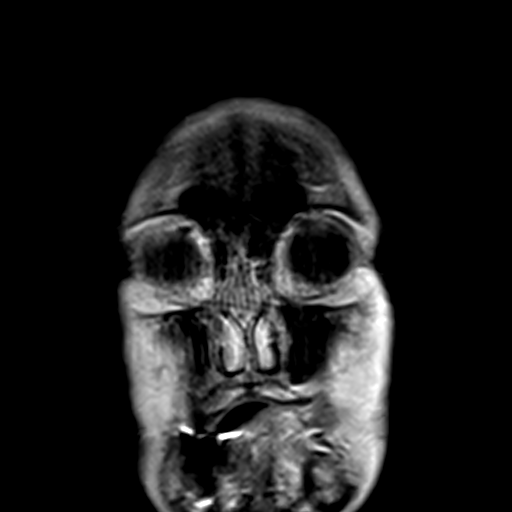
[im 13/26]
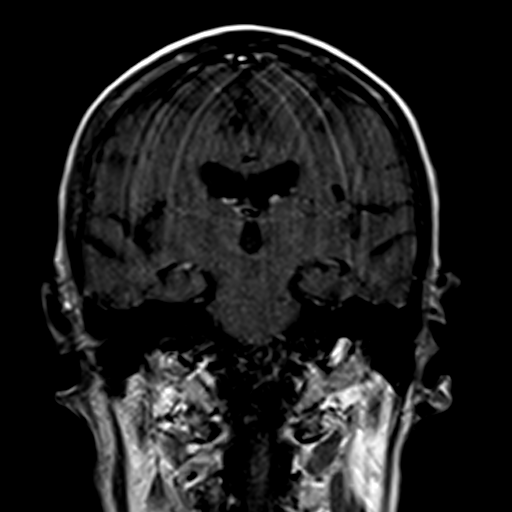
[im 26/26]
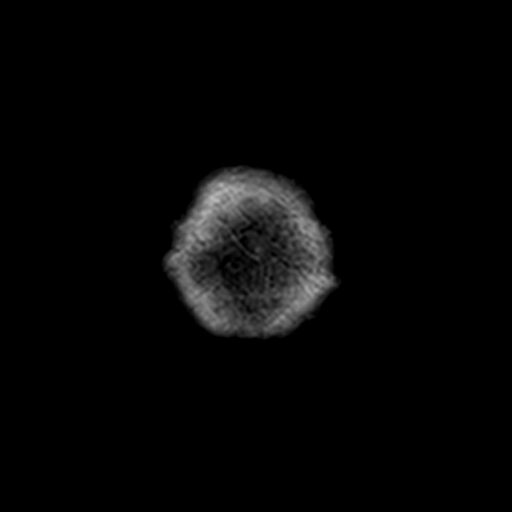

[31 of 48 positions shown; findings below may reference images not displayed]

FINDINGS: Most sequences suffer from at least mild motion artifact with some
being moderately to severely degraded.

Brain: There is no evidence of acute infarct, intracranial
hemorrhage, mass, midline shift, or extra-axial fluid collection.
Scattered punctate foci of T2 hyperintensity in the cerebral white
matter are nonspecific but may reflect minimal chronic small vessel
ischemic disease, not considered abnormal for age. A partially empty
sella is again incidentally noted. No abnormal brain parenchymal or
meningeal enhancement is identified. Mild cerebral atrophy is not
greater than expected for age. No focal cerebellar insult is
identified.

Vascular: Major intracranial vascular flow voids are preserved.

Skull and upper cervical spine: No destructive skull lesion.

Sinuses/Orbits: Bilateral cataract extraction. Tiny right maxillary
sinus mucous retention cyst. Clear mastoid air cells.

Other: None.
IMPRESSION: Motion degraded examination. No acute or significant intracranial
abnormality identified.

## 2020-01-12 ENCOUNTER — Other Ambulatory Visit: Payer: Self-pay

## 2020-01-12 ENCOUNTER — Encounter: Payer: Self-pay | Admitting: Family Medicine

## 2020-01-12 ENCOUNTER — Ambulatory Visit (INDEPENDENT_AMBULATORY_CARE_PROVIDER_SITE_OTHER): Payer: Medicare Other | Admitting: Family Medicine

## 2020-01-12 VITALS — BP 114/78 | HR 68 | Temp 98.0°F | Resp 20 | Ht 64.0 in | Wt 119.0 lb

## 2020-01-12 DIAGNOSIS — R0789 Other chest pain: Secondary | ICD-10-CM

## 2020-01-12 DIAGNOSIS — F411 Generalized anxiety disorder: Secondary | ICD-10-CM | POA: Diagnosis not present

## 2020-01-12 NOTE — Progress Notes (Signed)
Assessment & Plan:  1. Chest heaviness - Felt to be anxiety related. - EKG 12-Lead  2. GAD (generalized anxiety disorder) - Patient to stay on Effexor. Discussed if she wants to come off of it later, after her son is no longer living with her, she should come in and be weaned off appropriately.    Follow up plan: Return if symptoms worsen or fail to improve.  Hendricks Limes, MSN, APRN, FNP-C [facility]  Subjective:   Patient ID: [name] Williams, female    DOB: [birth year], 86 y.o.   MRN: [id number]  HPI: [name] Williams is a 86 y.o. female presenting on 01/12/2020 for Chest Pain (last Friday (1 episode )  Patient reports she quit taking the Effexor 2 weeks ago simply because she doesn't like to depend on medication. This past Friday she experienced a heavy sensation in her chest that lasted about all day. Her 54 y/o son is living with her and apparently causes her a lot of stress and anxiety. She restarted the Effexor on Friday and has had no further episodes. She would like an EKG just to make sure her heart is okay.     ROS: Negative unless specifically indicated above in HPI.   Relevant past medical history reviewed and updated as indicated.   Allergies and medications reviewed and updated.   Current Outpatient Medications:  .  aspirin 81 MG EC tablet, Take 81 mg by mouth every other day. , Disp: , Rfl:  .  venlafaxine XR (EFFEXOR-XR) 37.5 MG 24 hr capsule, Take 1 capsule (37.5 mg total) by mouth daily with breakfast., Disp: 90 capsule, Rfl: 3 .  mirtazapine (REMERON) 15 MG tablet, TAKE 1 TABLET BY MOUTH EVERYDAY AT BEDTIME (Patient not taking: Reported on 01/12/2020), Disp: 90 tablet, Rfl: 0  Allergies  Allergen Reactions  . Latex     Per pt 11-10-14 she do not remember being allergic to this.../or    Objective:   BP 114/78   Pulse 68   Temp 98 F (36.7 C)   Resp 20   Ht 5\' 4"  (1.626 m)   Wt 119 lb (54 kg)   SpO2 98%   BMI 20.43 kg/m     Physical Exam Vitals reviewed.  Constitutional:      General: She is not in acute distress.    Appearance: Normal appearance. She is normal weight. She is not ill-appearing, toxic-appearing or diaphoretic.  HENT:     Head: Normocephalic and atraumatic.  Eyes:     General: No scleral icterus.       Right eye: No discharge.        Left eye: No discharge.     Conjunctiva/sclera: Conjunctivae normal.  Cardiovascular:     Rate and Rhythm: Normal rate and regular rhythm.     Heart sounds: Normal heart sounds. No murmur. No friction rub. No gallop.   Pulmonary:     Effort: Pulmonary effort is normal. No respiratory distress.     Breath sounds: Normal breath sounds. No stridor. No wheezing, rhonchi or rales.  Musculoskeletal:        General: Normal range of motion.     Cervical back: Normal range of motion.  Skin:    General: Skin is warm and dry.     Capillary Refill: Capillary refill takes less than 2 seconds.  Neurological:     General: No focal deficit present.     Mental Status: She is alert and oriented to person,  place, and time. Mental status is at baseline.  Psychiatric:        Mood and Affect: Mood normal.        Behavior: Behavior normal.        Thought Content: Thought content normal.        Judgment: Judgment normal.    EKG: SR with occasional PVCs; HR 75

## 2020-01-20 ENCOUNTER — Telehealth: Payer: Self-pay | Admitting: Family

## 2020-01-20 NOTE — Telephone Encounter (Signed)
Pt wants to know if Alyse Low can write a note for her that lists all of her health conditions as reasoning why she is unable to take care of her disabled son anymore.

## 2020-01-20 NOTE — Telephone Encounter (Signed)
Her son is an alcoholic and lives with her. She hopes the letter will encourage him to move out.

## 2020-01-22 NOTE — Telephone Encounter (Signed)
Letter written and ready for pick up.

## 2020-01-22 NOTE — Telephone Encounter (Signed)
Aware.  Letter ready.

## 2020-02-02 ENCOUNTER — Inpatient Hospital Stay (HOSPITAL_COMMUNITY): Payer: Medicare Other

## 2020-02-02 ENCOUNTER — Ambulatory Visit (HOSPITAL_COMMUNITY): Payer: Medicare Other | Admitting: Hematology

## 2020-02-11 ENCOUNTER — Other Ambulatory Visit: Payer: Self-pay | Admitting: Family

## 2020-02-11 DIAGNOSIS — F411 Generalized anxiety disorder: Secondary | ICD-10-CM

## 2020-02-28 ENCOUNTER — Other Ambulatory Visit: Payer: Self-pay | Admitting: Family

## 2020-02-28 DIAGNOSIS — F3342 Major depressive disorder, recurrent, in full remission: Secondary | ICD-10-CM

## 2020-02-28 DIAGNOSIS — F411 Generalized anxiety disorder: Secondary | ICD-10-CM

## 2020-03-28 ENCOUNTER — Ambulatory Visit: Payer: Medicare Other | Admitting: Family Medicine

## 2020-03-28 DIAGNOSIS — R531 Weakness: Secondary | ICD-10-CM | POA: Diagnosis not present

## 2020-03-28 DIAGNOSIS — R319 Hematuria, unspecified: Secondary | ICD-10-CM | POA: Diagnosis not present

## 2020-03-28 DIAGNOSIS — N390 Urinary tract infection, site not specified: Secondary | ICD-10-CM | POA: Diagnosis not present

## 2020-03-28 NOTE — Progress Notes (Signed)
Telephone visit  Subjective: CC: "nerves are shot" and losing weight PCP: Sharion Balloon, FNP  I was finally able to reach patient and apparently she "went to urgent care" for her concern today.  Start time: 2:18pm (no answer), 2:58pm (no answer); 3:28pm End time: 3:29pm  Total time spent on patient care (including telephone call/ virtual visit): 1 minutes  [place], [place] [phone]

## 2020-04-11 DIAGNOSIS — R5383 Other fatigue: Secondary | ICD-10-CM | POA: Diagnosis not present

## 2020-04-12 ENCOUNTER — Telehealth: Payer: Self-pay | Admitting: *Deleted

## 2020-04-12 MED ORDER — BUSPIRONE HCL 5 MG PO TABS: 5.0000 mg | ORAL_TABLET | Freq: Two times a day (BID) | ORAL | 2 refills | Status: DC

## 2020-04-12 NOTE — Telephone Encounter (Signed)
Pt calls and states her sister just passed away. She is anxious and states she has no appetite. Will send in Buspar 5 mg BID.

## 2020-04-13 ENCOUNTER — Ambulatory Visit (INDEPENDENT_AMBULATORY_CARE_PROVIDER_SITE_OTHER): Payer: Medicare Other | Admitting: Family

## 2020-04-13 ENCOUNTER — Encounter: Payer: Self-pay | Admitting: Family

## 2020-04-13 ENCOUNTER — Other Ambulatory Visit: Payer: Self-pay

## 2020-04-13 VITALS — BP 113/75 | HR 97 | Temp 97.5°F | Resp 20 | Ht 64.0 in | Wt 119.4 lb

## 2020-04-13 DIAGNOSIS — C9110 Chronic lymphocytic leukemia of B-cell type not having achieved remission: Secondary | ICD-10-CM | POA: Diagnosis not present

## 2020-04-13 DIAGNOSIS — R5383 Other fatigue: Secondary | ICD-10-CM | POA: Diagnosis not present

## 2020-04-13 DIAGNOSIS — F3342 Major depressive disorder, recurrent, in full remission: Secondary | ICD-10-CM

## 2020-04-13 DIAGNOSIS — K219 Gastro-esophageal reflux disease without esophagitis: Secondary | ICD-10-CM | POA: Diagnosis not present

## 2020-04-13 DIAGNOSIS — F411 Generalized anxiety disorder: Secondary | ICD-10-CM

## 2020-04-13 NOTE — Progress Notes (Signed)
   Subjective:    Patient ID: [name] Williams, female    DOB: 08-20-1928, 86 y.o.   MRN: [id number]  Chief Complaint  Patient presents with  . Fatigue    HPI Pt presents to the office today to discuss fatigue, decreased appetite, and weight loss. Per Epic she is the same weight as 01/12/20.   She reports her sister died this week and will be traveling for her funeral.   She has CLL and is followed by Oncologists. However, when asked about this is states "not that I know of". Discussed this could be causing some of her fatigue.   Review of Systems  Constitutional: Positive for fatigue.  All other systems reviewed and are negative.      Objective:   Physical Exam Vitals reviewed.  Constitutional:      General: She is not in acute distress.    Appearance: She is well-developed. She is obese.  HENT:     Head: Normocephalic and atraumatic.     Right Ear: Tympanic membrane normal.     Left Ear: Tympanic membrane normal.  Eyes:     Pupils: Pupils are equal, round, and reactive to light.  Neck:     Thyroid: No thyromegaly.  Cardiovascular:     Rate and Rhythm: Normal rate and regular rhythm.     Heart sounds: Normal heart sounds. No murmur heard.   Pulmonary:     Effort: Pulmonary effort is normal. No respiratory distress.     Breath sounds: Normal breath sounds. No wheezing.  Abdominal:     General: Bowel sounds are normal. There is no distension.     Palpations: Abdomen is soft.     Tenderness: There is no abdominal tenderness.  Musculoskeletal:        General: No tenderness. Normal range of motion.     Cervical back: Normal range of motion and neck supple.  Skin:    General: Skin is warm and dry.  Neurological:     Mental Status: She is alert and oriented to person, place, and time.     Cranial Nerves: No cranial nerve deficit.     Deep Tendon Reflexes: Reflexes are normal and symmetric.  Psychiatric:        Behavior: Behavior normal.        Thought Content: Thought  content normal.        Judgment: Judgment normal.       BP 113/75   Pulse 97   Temp (!) 97.5 F (36.4 C) (Temporal)   Resp 20   Ht _0  (1.626 m)   Wt 119 lb 6 oz (54.1 kg)   SpO2 96%   BMI 20.49 kg/m      Assessment & Plan:  [name] Williams comes in today with chief complaint of Fatigue   Diagnosis and orders addressed:  1. Gastroesophageal reflux disease, unspecified whether esophagitis present - BMP8+EGFR - CBC with Differential/Platelet  2. CLL (chronic lymphocytic leukemia) (HCC) - BMP8+EGFR - CBC with Differential/Platelet  3. Recurrent major depressive disorder, in full remission ([place]) - BMP8+EGFR - CBC with Differential/Platelet  4. Other fatigue - BMP8+EGFR - CBC with Differential/Platelet  5. GAD (generalized anxiety disorder) - BMP8+EGFR - CBC with Differential/Platelet   Labs pending Health Maintenance reviewed Diet and exercise encouraged  Follow up plan: 6 months and keep specialists appt with Oncologists    Evelina Dun, FNP

## 2020-04-13 NOTE — Patient Instructions (Signed)
Chronic Lymphocytic Leukemia  Chronic lymphocytic leukemia (CLL) is a type of cancer of the blood cells and soft tissue inside bones (bone marrow). CLL happens when the bone marrow makes too many abnormal white blood cells. The cells, called leukemia cells, build up in the blood but do not function normally. Eventually they crowd out other healthy blood cells. CLL usually gets worse slowly. It can cause complications in your organs, such as in your spleen. It can also weaken your body's defense system (immune system), and may lead to conditions in which your immune system attacks your body (autoimmune conditions). What are the causes? The cause of this condition is not known. What increases the risk? You are more likely to develop this condition if:  You are older than age 34.  You are female.  You have a family history of CLL or other cancers of the lymph system.  You have been exposed to certain chemicals, such as: ? Insecticides. ? Herbicides. What are the signs or symptoms? At first, there may be no symptoms. After a while, symptoms may include:  Painless, swollen lymph nodes.  Feeling more tired than usual, even after rest.  Unplanned weight loss.  Heavy sweating at night.  Fever.  Shortness of breath.  Paleness.  A feeling of fullness in the upper left part of the abdomen.  Easy bruising or bleeding.  Small, flat, dark red spots under skin.  Frequent infections. How is this diagnosed? This condition is diagnosed based on:  A physical exam to check for an enlarged spleen, liver, or lymph nodes.  Blood and bone marrow tests to check for leukemia cells. Tests may include: ? A complete blood count. ? Flow cytometry. This test uses light sensors and dyes to check the number of cells, as well as their size, structure, and general health. ? Immunophenotyping. This test identifies specific antibodies that are found in white blood cells. The test is used when a complete  blood count shows the presence of immature cells or a high number of white blood cells. ? Fluorescence in situ hybridization (FISH). This test checks for defects in chromosomes and how those defects affect the functioning of the cell. Results from a [place] test will be used to determine treatment and assess the outcome of that treatment.  A CT scan to check for swelling or anything abnormal in your spleen, liver, and lymph nodes. How is this treated? Treatment for this condition depends on the stage of the leukemia and whether you have symptoms. Treatment may include:  Observation.  Chemotherapy medicines. These are medicines that kill leukemia cells.  Targeted chemotherapy. These are medicines that try to stop the growth of leukemia cells. They identify and attack specific leukemia cells without harming normal cells.  Radiation.  Surgery to remove the spleen.  Biological therapy (immunotherapy). This treatment boosts the ability of your immune system to fight the leukemia cells.  Bone marrow or peripheral blood stem cell transplant. This treatment replaces your own bone marrow or stem cells with bone marrow or stem cells from a donor.  New treatments through clinical trials. These are also called experimental treatments. Additional medicines may be needed to help manage symptoms and side effects of treatment. Follow these instructions at home: Medicines  Take over-the-counter and prescription medicines only as told by your health care provider.  If you were prescribed an antibiotic medicine, take it as told by your health care provider. Do not stop taking the antibiotic even if you start to  feel better. If you are on chemotherapy:  Wash your hands often, especially before meals, after being outside, and after using the toilet. Have visitors do the same.  Keep your teeth and gums clean and well cared for. Brush regularly. Use soft toothbrushes.  Protect your skin from the sun by  using sunscreen and wearing protective clothing.  Make sure that your family members get a flu shot (influenza vaccine) every year. General instructions  Avoid contact sports or other rough activities. Ask your health care provider what activities are safe for you.  Avoid crowded places and people who are sick.  Tell your cancer care team if you develop side effects. They may be able to recommend ways to relieve them.  Try to eat regular, healthy meals. Some of your treatments might affect your appetite.  Find healthy ways of coping with stress, such as by doing yoga or meditation or by joining a support group.  Keep all follow-up visits as told by your health care provider. This is important. Where to find more information  American Cancer Society: www.cancer.org  Leukemia and Lymphoma Society: PreviewPal.pl  National Cancer Institute ([place]): www.cancer.gov Contact a health care provider if you:  Have pain in your abdomen.  Develop new bruises that are getting bigger.  Have painful or more swollen lymph nodes.  Develop bleeding from your gums or nose.  Cannot eat or drink without vomiting.  Feel light-headed. Get help right away if you:  Have a fever or chills.  Develop chest pain.  Have trouble breathing or feel short of breath.  Faint.  See blood in your urine or stool (feces).  Have excessive bleeding.  Have any symptoms that are severe or uncontrolled. Summary  Chronic lymphocytic leukemia (CLL) is a type of cancer of the blood cells and bone marrow.  This condition can cause an enlarged spleen, swollen lymph nodes, a weakened immune system, low red blood cell and platelets counts, and autoimmune conditions.  Treatment for this condition depends on the stage of the cancer and whether you have symptoms.  Chemotherapy, radiation, surgery to remove the spleen, and bone marrow transplant are some of the ways to treat CLL.  Keep all follow-up visits as told  by your health care provider. This is important. This information is not intended to replace advice given to you by your health care provider. Make sure you discuss any questions you have with your health care provider. Document Revised: 03/05/2019 Document Reviewed: 03/05/2019 Elsevier Patient Education  [place].

## 2020-04-14 ENCOUNTER — Telehealth: Payer: Self-pay | Admitting: Family

## 2020-04-14 LAB — BMP8+EGFR
BUN/Creatinine Ratio: 17 (ref 12–28)
BUN: 21 mg/dL (ref 10–36)
CO2: 29 mmol/L (ref 20–29)
Calcium: 9.2 mg/dL (ref 8.7–10.3)
Chloride: 100 mmol/L (ref 96–106)
Creatinine, Ser: 1.25 mg/dL — ABNORMAL HIGH (ref 0.57–1.00)
GFR calc Af Amer: 44 mL/min/{1.73_m2} — ABNORMAL LOW (ref >59)
GFR calc non Af Amer: 38 mL/min/{1.73_m2} — ABNORMAL LOW (ref >59)
Glucose: 90 mg/dL (ref 65–99)
Potassium: 4.9 mmol/L (ref 3.5–5.2)
Sodium: 140 mmol/L (ref 134–144)

## 2020-04-14 LAB — CBC WITH DIFFERENTIAL/PLATELET
Basophils Absolute: 0.1 10*3/uL (ref 0.0–0.2)
Basos: 1 %
EOS (ABSOLUTE): 0.3 10*3/uL (ref 0.0–0.4)
Eos: 3 %
Hematocrit: 45.6 % (ref 34.0–46.6)
Hemoglobin: 14.8 g/dL (ref 11.1–15.9)
Immature Grans (Abs): 0 10*3/uL (ref 0.0–0.1)
Immature Granulocytes: 0 %
Lymphocytes Absolute: 6 10*3/uL — ABNORMAL HIGH (ref 0.7–3.1)
Lymphs: 52 %
MCH: 30.1 pg (ref 26.6–33.0)
MCHC: 32.5 g/dL (ref 31.5–35.7)
MCV: 93 fL (ref 79–97)
Monocytes Absolute: 0.7 10*3/uL (ref 0.1–0.9)
Monocytes: 6 %
Neutrophils Absolute: 4.4 10*3/uL (ref 1.4–7.0)
Neutrophils: 38 %
Platelets: 245 10*3/uL (ref 150–450)
RBC: 4.92 x10E6/uL (ref 3.77–5.28)
RDW: 12.4 % (ref 11.7–15.4)
WBC: 11.6 10*3/uL — ABNORMAL HIGH (ref 3.4–10.8)

## 2020-04-18 NOTE — Telephone Encounter (Signed)
Called and left message on voicemail.

## 2020-05-02 ENCOUNTER — Inpatient Hospital Stay (HOSPITAL_COMMUNITY): Payer: Medicare Other | Attending: Hematology

## 2020-05-02 ENCOUNTER — Other Ambulatory Visit: Payer: Self-pay

## 2020-05-02 DIAGNOSIS — R634 Abnormal weight loss: Secondary | ICD-10-CM | POA: Insufficient documentation

## 2020-05-02 DIAGNOSIS — N189 Chronic kidney disease, unspecified: Secondary | ICD-10-CM | POA: Diagnosis not present

## 2020-05-02 DIAGNOSIS — E559 Vitamin D deficiency, unspecified: Secondary | ICD-10-CM | POA: Insufficient documentation

## 2020-05-02 DIAGNOSIS — I129 Hypertensive chronic kidney disease with stage 1 through stage 4 chronic kidney disease, or unspecified chronic kidney disease: Secondary | ICD-10-CM | POA: Diagnosis not present

## 2020-05-02 DIAGNOSIS — C9110 Chronic lymphocytic leukemia of B-cell type not having achieved remission: Secondary | ICD-10-CM | POA: Diagnosis not present

## 2020-05-02 DIAGNOSIS — Z79899 Other long term (current) drug therapy: Secondary | ICD-10-CM | POA: Diagnosis not present

## 2020-05-02 DIAGNOSIS — F329 Major depressive disorder, single episode, unspecified: Secondary | ICD-10-CM | POA: Diagnosis not present

## 2020-05-02 DIAGNOSIS — Z87891 Personal history of nicotine dependence: Secondary | ICD-10-CM | POA: Insufficient documentation

## 2020-05-02 DIAGNOSIS — Z803 Family history of malignant neoplasm of breast: Secondary | ICD-10-CM | POA: Diagnosis not present

## 2020-05-02 DIAGNOSIS — Z8049 Family history of malignant neoplasm of other genital organs: Secondary | ICD-10-CM | POA: Insufficient documentation

## 2020-05-02 DIAGNOSIS — R630 Anorexia: Secondary | ICD-10-CM | POA: Insufficient documentation

## 2020-05-02 DIAGNOSIS — Z800 Family history of malignant neoplasm of digestive organs: Secondary | ICD-10-CM | POA: Diagnosis not present

## 2020-05-02 LAB — COMPREHENSIVE METABOLIC PANEL
ALT: 18 U/L (ref 0–44)
AST: 20 U/L (ref 15–41)
Albumin: 3.3 g/dL — ABNORMAL LOW (ref 3.5–5.0)
Alkaline Phosphatase: 74 U/L (ref 38–126)
Anion gap: 10 (ref 5–15)
BUN: 15 mg/dL (ref 8–23)
CO2: 29 mmol/L (ref 22–32)
Calcium: 9.2 mg/dL (ref 8.9–10.3)
Chloride: 100 mmol/L (ref 98–111)
Creatinine, Ser: 1.16 mg/dL — ABNORMAL HIGH (ref 0.44–1.00)
GFR calc Af Amer: 48 mL/min — ABNORMAL LOW (ref >60)
GFR calc non Af Amer: 41 mL/min — ABNORMAL LOW (ref >60)
Glucose, Bld: 114 mg/dL — ABNORMAL HIGH (ref 70–99)
Potassium: 4.2 mmol/L (ref 3.5–5.1)
Sodium: 139 mmol/L (ref 135–145)
Total Bilirubin: 0.8 mg/dL (ref 0.3–1.2)
Total Protein: 6.3 g/dL — ABNORMAL LOW (ref 6.5–8.1)

## 2020-05-02 LAB — CBC WITH DIFFERENTIAL/PLATELET
Abs Immature Granulocytes: 0.02 10*3/uL (ref 0.00–0.07)
Basophils Absolute: 0.1 10*3/uL (ref 0.0–0.1)
Basophils Relative: 1 %
Eosinophils Absolute: 0.3 10*3/uL (ref 0.0–0.5)
Eosinophils Relative: 2 %
HCT: 45.2 % (ref 36.0–46.0)
Hemoglobin: 14.2 g/dL (ref 12.0–15.0)
Immature Granulocytes: 0 %
Lymphocytes Relative: 57 %
Lymphs Abs: 8.9 10*3/uL — ABNORMAL HIGH (ref 0.7–4.0)
MCH: 30.3 pg (ref 26.0–34.0)
MCHC: 31.4 g/dL (ref 30.0–36.0)
MCV: 96.4 fL (ref 80.0–100.0)
Monocytes Absolute: 1.1 10*3/uL — ABNORMAL HIGH (ref 0.1–1.0)
Monocytes Relative: 7 %
Neutro Abs: 5.1 10*3/uL (ref 1.7–7.7)
Neutrophils Relative %: 33 %
Platelets: 287 10*3/uL (ref 150–400)
RBC: 4.69 MIL/uL (ref 3.87–5.11)
RDW: 12.7 % (ref 11.5–15.5)
WBC: 15.5 10*3/uL — ABNORMAL HIGH (ref 4.0–10.5)
nRBC: 0 % (ref 0.0–0.2)

## 2020-05-02 LAB — LACTATE DEHYDROGENASE: LDH: 132 U/L (ref 98–192)

## 2020-05-02 LAB — FOLATE: Folate: 15.7 ng/mL (ref >5.9)

## 2020-05-02 LAB — VITAMIN D 25 HYDROXY (VIT D DEFICIENCY, FRACTURES): Vit D, 25-Hydroxy: 23.88 ng/mL — ABNORMAL LOW (ref 30–100)

## 2020-05-02 LAB — VITAMIN B12: Vitamin B-12: >7500 pg/mL — ABNORMAL HIGH (ref 180–914)

## 2020-05-04 ENCOUNTER — Other Ambulatory Visit: Payer: Self-pay | Admitting: Family

## 2020-05-05 ENCOUNTER — Other Ambulatory Visit: Payer: Self-pay | Admitting: Family

## 2020-05-05 DIAGNOSIS — F411 Generalized anxiety disorder: Secondary | ICD-10-CM

## 2020-05-09 ENCOUNTER — Inpatient Hospital Stay (HOSPITAL_COMMUNITY): Payer: Medicare Other | Admitting: Hematology

## 2020-05-09 ENCOUNTER — Other Ambulatory Visit: Payer: Self-pay

## 2020-05-09 ENCOUNTER — Other Ambulatory Visit (HOSPITAL_COMMUNITY): Payer: Self-pay | Admitting: *Deleted

## 2020-05-09 VITALS — BP 112/88 | HR 88 | Temp 96.9°F | Resp 17 | Wt 119.2 lb

## 2020-05-09 DIAGNOSIS — E559 Vitamin D deficiency, unspecified: Secondary | ICD-10-CM | POA: Diagnosis not present

## 2020-05-09 DIAGNOSIS — C9110 Chronic lymphocytic leukemia of B-cell type not having achieved remission: Secondary | ICD-10-CM | POA: Diagnosis not present

## 2020-05-09 DIAGNOSIS — Z87891 Personal history of nicotine dependence: Secondary | ICD-10-CM | POA: Diagnosis not present

## 2020-05-09 DIAGNOSIS — Z79899 Other long term (current) drug therapy: Secondary | ICD-10-CM | POA: Diagnosis not present

## 2020-05-09 DIAGNOSIS — Z803 Family history of malignant neoplasm of breast: Secondary | ICD-10-CM | POA: Diagnosis not present

## 2020-05-09 DIAGNOSIS — I129 Hypertensive chronic kidney disease with stage 1 through stage 4 chronic kidney disease, or unspecified chronic kidney disease: Secondary | ICD-10-CM | POA: Diagnosis not present

## 2020-05-09 DIAGNOSIS — Z800 Family history of malignant neoplasm of digestive organs: Secondary | ICD-10-CM | POA: Diagnosis not present

## 2020-05-09 DIAGNOSIS — R634 Abnormal weight loss: Secondary | ICD-10-CM | POA: Diagnosis not present

## 2020-05-09 DIAGNOSIS — N189 Chronic kidney disease, unspecified: Secondary | ICD-10-CM | POA: Diagnosis not present

## 2020-05-09 MED ORDER — DRONABINOL 2.5 MG PO CAPS: 2.5000 mg | ORAL_CAPSULE | Freq: Two times a day (BID) | ORAL | 2 refills | Status: DC

## 2020-05-09 NOTE — Progress Notes (Signed)
[facility] [place], [place] [address]   CLINIC:  Medical Oncology/Hematology  PCP:  Sharion Balloon, [place] / [place] [address]  [phone]  REASON FOR VISIT:  Follow-up for CLL  PRIOR THERAPY: None  CURRENT THERAPY: Observation  INTERVAL HISTORY:  Ms. KENSLI BOWLEY, a 86 y.o. female, returns for routine follow-up for her CLL. [name] was last seen on 04/23/2019.  Today she is accompanied by her daughter. She reports that her appetite has gone down significantly, though she is sleeping well. She denies having any aches or pains.   REVIEW OF SYSTEMS:  Review of Systems  Constitutional: Positive for appetite change (depleted) and fatigue (depleted).  Musculoskeletal: Negative for arthralgias and myalgias.  Neurological: Positive for dizziness (occasional).  Psychiatric/Behavioral: Positive for depression.  All other systems reviewed and are negative.   PAST MEDICAL/SURGICAL HISTORY:  Past Medical History:  Diagnosis Date  . Anxiety   . Depressive disorder, not elsewhere classified   . Esophageal reflux   . Essential hypertension, benign   . Hypertr obst cardiomyop   . Other and unspecified hyperlipidemia   . Stroke ([place])   . Unspecified transient cerebral ischemia    Past Surgical History:  Procedure Laterality Date  . BLADDER REPAIR    . CERVIX SURGERY    . CHOLECYSTECTOMY    . TONSILLECTOMY    . TUBAL LIGATION      SOCIAL HISTORY:  Social History   Socioeconomic History  . Marital status: Widowed    Spouse name: Not on file  . Number of children: 3  . Years of education: Not on file  . Highest education level: 9th grade  Occupational History  . Not on file  Tobacco Use  . Smoking status: Former Research scientist (life sciences)  . Smokeless tobacco: Never Used  . Tobacco comment: quit 2012  Vaping Use  . Vaping Use: Never used  Substance and Sexual Activity  . Alcohol use: No  . Drug use: No  . Sexual activity: Not  Currently  Other Topics Concern  . Not on file  Social History Narrative   Patient is retired.    Patient is widowed.    Patient has 3 children.    Patient is right handed.    Pt lives with son.     Social Determinants of Health   Financial Resource Strain:   . Difficulty of Paying Living Expenses: Not on file  Food Insecurity:   . Worried About Charity fundraiser in the Last Year: Not on file  . Ran Out of Food in the Last Year: Not on file  Transportation Needs:   . Lack of Transportation (Medical): Not on file  . Lack of Transportation (Non-Medical): Not on file  Physical Activity:   . Days of Exercise per Week: Not on file  . Minutes of Exercise per Session: Not on file  Stress:   . Feeling of Stress : Not on file  Social Connections:   . Frequency of Communication with Friends and Family: Not on file  . Frequency of Social Gatherings with Friends and Family: Not on file  . Attends Religious Services: Not on file  . Active Member of Clubs or Organizations: Not on file  . Attends Archivist Meetings: Not on file  . Marital Status: Not on file  Intimate Partner Violence:   . Fear of Current or Ex-Partner: Not on file  . Emotionally Abused: Not on file  .  Physically Abused: Not on file  . Sexually Abused: Not on file    FAMILY HISTORY:  Family History  Problem Relation Age of Onset  . Breast cancer Mother   . Stomach cancer Sister   . Uterine cancer Sister   . Depression Sister   . Cancer Brother   . Alcohol abuse Son     CURRENT MEDICATIONS:  Current Outpatient Medications  Medication Sig Dispense Refill  . mirtazapine (REMERON) 15 MG tablet TAKE 1 TABLET BY MOUTH EVERYDAY AT BEDTIME 90 tablet 0  . aspirin 81 MG EC tablet Take 81 mg by mouth every other day.     . busPIRone (BUSPAR) 5 MG tablet TAKE 1 TABLET BY MOUTH TWICE A DAY 180 tablet 0  . venlafaxine XR (EFFEXOR-XR) 37.5 MG 24 hr capsule TAKE 1 CAPSULE BY MOUTH DAILY WITH BREAKFAST FOR 14  DAYS, THEN 2 CAPSULES DAILY WITH BREAKFAST FOR 14 DAYS. 30 capsule 2   No current facility-administered medications for this visit.    ALLERGIES:  Allergies  Allergen Reactions  . Latex     Per pt 11-10-14 she do not remember being allergic to this.../or    PHYSICAL EXAM:  Performance status (ECOG): 1 - Symptomatic but completely ambulatory  Vitals:   05/09/20 1052  BP: 112/88  Pulse: 88  Resp: 17  Temp: (!) 96.9 F (36.1 C)  SpO2: 98%   Wt Readings from Last 3 Encounters:  05/09/20 119 lb 3.2 oz (54.1 kg)  04/13/20 119 lb 6 oz (54.1 kg)  01/12/20 119 lb (54 kg)   Physical Exam Vitals reviewed.  Constitutional:      Appearance: Normal appearance.  Cardiovascular:     Rate and Rhythm: Normal rate and regular rhythm.     Pulses: Normal pulses.     Heart sounds: Normal heart sounds.  Pulmonary:     Effort: Pulmonary effort is normal.     Breath sounds: Normal breath sounds.  Abdominal:     Palpations: Abdomen is soft. There is no hepatomegaly, splenomegaly or mass.     Tenderness: There is no abdominal tenderness.     Hernia: No hernia is present.  Lymphadenopathy:     Cervical: No cervical adenopathy.     Upper Body:     Right upper body: No supraclavicular, axillary or pectoral adenopathy.     Left upper body: No supraclavicular, axillary or pectoral adenopathy.     Lower Body: No right inguinal adenopathy. No left inguinal adenopathy.  Neurological:     General: No focal deficit present.     Mental Status: She is alert and oriented to person, place, and time.  Psychiatric:        Mood and Affect: Mood normal.        Behavior: Behavior normal.     LABORATORY DATA:  I have reviewed the labs as listed.  CBC Latest Ref Rng & Units 05/02/2020 04/13/2020 11/25/2019  WBC 4.0 - 10.5 K/uL 15.5(H) 11.6(H) 16.8(H)  Hemoglobin 12.0 - 15.0 g/dL 14.2 14.8 14.8  Hematocrit 36 - 46 % 45.2 45.6 47.0(H)  Platelets 150 - 400 K/uL 287 245 204   CMP Latest Ref Rng & Units  05/02/2020 04/13/2020 11/25/2019  Glucose 70 - 99 mg/dL 114(H) 90 90  BUN 8 - 23 mg/dL $Remove'15 21 14  'cYvhSBs$ Creatinine 0.44 - 1.00 mg/dL 1.16(H) 1.25(H) 1.22(H)  Sodium 135 - 145 mmol/L 139 140 142  Potassium 3.5 - 5.1 mmol/L 4.2 4.9 4.2  Chloride 98 - 111 mmol/L  100 100 103  CO2 22 - 32 mmol/L 29 29 32  Calcium 8.9 - 10.3 mg/dL 9.2 9.2 9.3  Total Protein 6.5 - 8.1 g/dL 6.3(L) - 7.0  Total Bilirubin 0.3 - 1.2 mg/dL 0.8 - 0.8  Alkaline Phos 38 - 126 U/L 74 - 83  AST 15 - 41 U/L 20 - 23  ALT 0 - 44 U/L 18 - 15      Component Value Date/Time   RBC 4.69 05/02/2020 1058   MCV 96.4 05/02/2020 1058   MCV 93 04/13/2020 1145   MCH 30.3 05/02/2020 1058   MCHC 31.4 05/02/2020 1058   RDW 12.7 05/02/2020 1058   RDW 12.4 04/13/2020 1145   LYMPHSABS 8.9 (H) 05/02/2020 1058   LYMPHSABS 6.0 (H) 04/13/2020 1145   MONOABS 1.1 (H) 05/02/2020 1058   EOSABS 0.3 05/02/2020 1058   EOSABS 0.3 04/13/2020 1145   BASOSABS 0.1 05/02/2020 1058   BASOSABS 0.1 04/13/2020 1145   Lab Results  Component Value Date   LDH 132 05/02/2020   LDH 148 11/25/2019   LDH 144 03/12/2019   Lab Results  Component Value Date   VD25OH 23.88 (L) 05/02/2020   VD25OH 33.3 10/06/2014    DIAGNOSTIC IMAGING:  I have independently reviewed the scans and discussed with the patient. No results found.   ASSESSMENT:  1.  CLL, IGHV mutated, Tp53 negative: -She had elevated white count since 2009.  No fevers or night sweats.  Almost 20 pound weight loss in the last 2 months. -Peripheral blood flow cytometry was consistent with CLL. -Physical examination did not show any splenomegaly or lymphadenopathy. -CT CAP on 04/01/2019 showed mild mediastinal and bilateral hilar adenopathy measuring up to 11 mm.  Noncalcified pulmonary nodule seen in the right upper lobe. -CLL FISH panel showed trisomy 12 and 13 q. deletion.  2.  Weight loss: -18 to 20 pound weight loss in the last 2 months since a tick bite. -Lyme disease and RMSF antibody panel  was negative.  Alpha gal panel showed low positivity for beef and pork IgE. -Currently on mirtazapine at bedtime which is not helping with appetite.  3.  CKD: -She had mild CKD since February 2016.  4.  Health maintenance: -Last colonoscopy was 05/2002 which showed normal rectum and diverticular disease.   PLAN:  1.  CLL, IGHV mutated, Tp53 negative: -No palpable adenopathy or splenomegaly.  No B symptoms. -Reviewed labs.  White count is 15.5, with normal hemoglobin and platelets.  LDH was normal. -No indication for treatment.  RTC 6 months with labs and exam.  2.   Loss of appetite: -Weight has been stable.  Mirtazapine is not helping with appetite. -We will add Marinol 2.5 mg twice daily.  3.  Vitamin D deficiency: -Recommended vitamin D 2000 units daily.  Will check in 6 months.  4.  Depression: -Continue venlafaxine 75 mg daily.   Orders placed this encounter:  No orders of the defined types were placed in this encounter.    Derek Jack, MD [facility] [phone]   I, Milinda Antis, am acting as a scribe for Dr. Sanda Linger.  I, Derek Jack MD, have reviewed the above documentation for accuracy and completeness, and I agree with the above.

## 2020-05-09 NOTE — Patient Instructions (Signed)
[facility] at [facility] Discharge Instructions  You were seen today by Dr. Delton Coombes. He went over your recent results. Purchase vitamin D over the counter and take 2,000 units daily. You may take vitamin B12 every other day. You will be prescribed Marinol 2.5 mg twice daily to improve your appetite. Dr. Delton Coombes will see you back in 6 months for labs and follow up.   Thank you for choosing [facility] at [facility] to provide your oncology and hematology care.  To afford each patient quality time with our provider, please arrive at least 15 minutes before your scheduled appointment time.   If you have a lab appointment with the [facility] please come in thru the Main Entrance and check in at the main information desk  You need to re-schedule your appointment should you arrive 10 or more minutes late.  We strive to give you quality time with our providers, and arriving late affects you and other patients whose appointments are after yours.  Also, if you no show three or more times for appointments you may be dismissed from the clinic at the providers discretion.     Again, thank you for choosing [facility].  Our hope is that these requests will decrease the amount of time that you wait before being seen by our physicians.       _____________________________________________________________  Should you have questions after your visit to [facility], please contact our office at [phone] between the hours of 8:00 a.m. and 4:30 p.m.  Voicemails left after 4:00 p.m. will not be returned until the following business day.  For prescription refill requests, have your pharmacy contact our office and allow 72 hours.    Cancer Center Support Programs:   > Cancer Support Group  2nd Tuesday of the month 1pm-2pm, Journey Room

## 2020-07-31 ENCOUNTER — Other Ambulatory Visit: Payer: Self-pay | Admitting: Family

## 2020-07-31 ENCOUNTER — Other Ambulatory Visit: Payer: Self-pay | Admitting: Family Medicine

## 2020-07-31 DIAGNOSIS — F411 Generalized anxiety disorder: Secondary | ICD-10-CM

## 2020-08-15 ENCOUNTER — Ambulatory Visit: Payer: Medicare Other | Admitting: Family

## 2020-08-16 ENCOUNTER — Encounter: Payer: Self-pay | Admitting: Family

## 2020-09-12 ENCOUNTER — Other Ambulatory Visit: Payer: Self-pay

## 2020-09-12 ENCOUNTER — Encounter: Payer: Self-pay | Admitting: Family

## 2020-09-12 ENCOUNTER — Other Ambulatory Visit: Payer: Self-pay | Admitting: Family

## 2020-09-12 ENCOUNTER — Ambulatory Visit (INDEPENDENT_AMBULATORY_CARE_PROVIDER_SITE_OTHER): Payer: Medicare HMO | Admitting: Family

## 2020-09-12 VITALS — BP 135/84 | HR 75 | Temp 97.7°F | Ht 64.0 in | Wt 121.8 lb

## 2020-09-12 DIAGNOSIS — I10 Essential (primary) hypertension: Secondary | ICD-10-CM | POA: Diagnosis not present

## 2020-09-12 DIAGNOSIS — F411 Generalized anxiety disorder: Secondary | ICD-10-CM

## 2020-09-12 DIAGNOSIS — G4700 Insomnia, unspecified: Secondary | ICD-10-CM | POA: Diagnosis not present

## 2020-09-12 DIAGNOSIS — C9110 Chronic lymphocytic leukemia of B-cell type not having achieved remission: Secondary | ICD-10-CM | POA: Diagnosis not present

## 2020-09-12 DIAGNOSIS — F3342 Major depressive disorder, recurrent, in full remission: Secondary | ICD-10-CM

## 2020-09-12 DIAGNOSIS — R413 Other amnesia: Secondary | ICD-10-CM | POA: Diagnosis not present

## 2020-09-12 DIAGNOSIS — K219 Gastro-esophageal reflux disease without esophagitis: Secondary | ICD-10-CM

## 2020-09-12 MED ORDER — VENLAFAXINE HCL ER 37.5 MG PO CP24: 37.5000 mg | ORAL_CAPSULE | Freq: Every day | ORAL | 2 refills | Status: DC

## 2020-09-12 MED ORDER — BUSPIRONE HCL 5 MG PO TABS: 5.0000 mg | ORAL_TABLET | Freq: Two times a day (BID) | ORAL | 4 refills | Status: DC

## 2020-09-12 NOTE — Patient Instructions (Signed)
http://NIMH.NIH.Gov">  Generalized Anxiety Disorder, Adult Generalized anxiety disorder (GAD) is a mental health condition. Unlike normal worries, anxiety related to GAD is not triggered by a specific event. These worries do not fade or get better with time. GAD interferes with relationships, work, and school. GAD symptoms can vary from mild to severe. People with severe GAD can have intense waves of anxiety with physical symptoms that are similar to panic attacks. What are the causes? The exact cause of GAD is not known, but the following are believed to have an impact:  Differences in natural brain chemicals.  Genes passed down from parents to children.  Differences in the way threats are perceived.  Development during childhood.  Personality. What increases the risk? The following factors may make you more likely to develop this condition:  Being female.  Having a family history of anxiety disorders.  Being very shy.  Experiencing very stressful life events, such as the death of a loved one.  Having a very stressful family environment. What are the signs or symptoms? People with GAD often worry excessively about many things in their lives, such as their health and family. Symptoms may also include:  Mental and emotional symptoms: ? Worrying excessively about natural disasters. ? Fear of being late. ? Difficulty concentrating. ? Fears that others are judging your performance.  Physical symptoms: ? Fatigue. ? Headaches, muscle tension, muscle twitches, trembling, or feeling shaky. ? Feeling like your heart is pounding or beating very fast. ? Feeling out of breath or like you cannot take a deep breath. ? Having trouble falling asleep or staying asleep, or experiencing restlessness. ? Sweating. ? Nausea, diarrhea, or irritable bowel syndrome (IBS).  Behavioral symptoms: ? Experiencing erratic moods or irritability. ? Avoidance of new situations. ? Avoidance of  people. ? Extreme difficulty making decisions. How is this diagnosed? This condition is diagnosed based on your symptoms and medical history. You will also have a physical exam. Your health care provider may perform tests to rule out other possible causes of your symptoms. To be diagnosed with GAD, a person must have anxiety that:  Is out of his or her control.  Affects several different aspects of his or her life, such as work and relationships.  Causes distress that makes him or her unable to take part in normal activities.  Includes at least three symptoms of GAD, such as restlessness, fatigue, trouble concentrating, irritability, muscle tension, or sleep problems. Before your health care provider can confirm a diagnosis of GAD, these symptoms must be present more days than they are not, and they must last for 6 months or longer. How is this treated? This condition may be treated with:  Medicine. Antidepressant medicine is usually prescribed for long-term daily control. Anti-anxiety medicines may be added in severe cases, especially when panic attacks occur.  Talk therapy (psychotherapy). Certain types of talk therapy can be helpful in treating GAD by providing support, education, and guidance. Options include: ? Cognitive behavioral therapy (CBT). People learn coping skills and self-calming techniques to ease their physical symptoms. They learn to identify unrealistic thoughts and behaviors and to replace them with more appropriate thoughts and behaviors. ? Acceptance and commitment therapy (ACT). This treatment teaches people how to be mindful as a way to cope with unwanted thoughts and feelings. ? Biofeedback. This process trains you to manage your body's response (physiological response) through breathing techniques and relaxation methods. You will work with a therapist while machines are used to monitor your physical   symptoms.  Stress management techniques. These include yoga,  meditation, and exercise. A mental health specialist can help determine which treatment is best for you. Some people see improvement with one type of therapy. However, other people require a combination of therapies.   Follow these instructions at home: Lifestyle  Maintain a consistent routine and schedule.  Anticipate stressful situations. Create a plan, and allow extra time to work with your plan.  Practice stress management or self-calming techniques that you have learned from your therapist or your health care provider. General instructions  Take over-the-counter and prescription medicines only as told by your health care provider.  Understand that you are likely to have setbacks. Accept this and be kind to yourself as you persist to take better care of yourself.  Recognize and accept your accomplishments, even if you judge them as small.  Keep all follow-up visits as told by your health care provider. This is important. Contact a health care provider if:  Your symptoms do not get better.  Your symptoms get worse.  You have signs of depression, such as: ? A persistently sad or irritable mood. ? Loss of enjoyment in activities that used to bring you joy. ? Change in weight or eating. ? Changes in sleeping habits. ? Avoiding friends or family members. ? Loss of energy for normal tasks. ? Feelings of guilt or worthlessness. Get help right away if:  You have serious thoughts about hurting yourself or others. If you ever feel like you may hurt yourself or others, or have thoughts about taking your own life, get help right away. Go to your nearest emergency department or:  Call your local emergency services (911 in the [place]  Call a suicide crisis helpline, such as the National Suicide Prevention Lifeline at 1-800-273-8255. This is open 24 hours a day in the [place]  Text the Crisis Text Line at 741741 (in the [place] Summary  Generalized anxiety disorder (GAD) is a mental  health condition that involves worry that is not triggered by a specific event.  People with GAD often worry excessively about many things in their lives, such as their health and family.  GAD may cause symptoms such as restlessness, trouble concentrating, sleep problems, frequent sweating, nausea, diarrhea, headaches, and trembling or muscle twitching.  A mental health specialist can help determine which treatment is best for you. Some people see improvement with one type of therapy. However, other people require a combination of therapies. This information is not intended to replace advice given to you by your health care provider. Make sure you discuss any questions you have with your health care provider. Document Revised: 05/27/2019 Document Reviewed: 05/27/2019 Elsevier Patient Education  2021 [employer].  

## 2020-09-12 NOTE — Progress Notes (Signed)
Subjective:    Patient ID: [name] Williams, female    DOB: 02/12/1928, 87 y.o.   MRN: [id number]  Chief Complaint  Patient presents with  . Medical Management of Chronic Issues  . Depression   Pt presents to the office today for chronic follow up.   She states her anxiety and depression has worsen during the winter months. Her son lives in her basement and he is alcoholic. They are currently working on getting him into a nursing home. She reports this is a great deal of stress on her.    She has CLL and is followed by Oncologists, but states she has not seen them recently. Per Epic last visit was 05/09/20. Depression        This is a chronic problem.  The current episode started more than 1 year ago.   The onset quality is gradual.   The problem occurs intermittently.  Associated symptoms include fatigue, helplessness, hopelessness, insomnia, irritable, restlessness and sad.  Past medical history includes anxiety.   Anxiety Presents for follow-up visit. Symptoms include depressed mood, insomnia, irritability, nervous/anxious behavior and restlessness. Symptoms occur most days. The severity of symptoms is moderate.    Gastroesophageal Reflux She complains of belching and a hoarse voice. This is a chronic problem. The current episode started more than 1 year ago. The problem has been waxing and waning. The symptoms are aggravated by certain foods. Associated symptoms include fatigue. She has tried an antacid for the symptoms. The treatment provided moderate relief.  Insomnia Primary symptoms: difficulty falling asleep, frequent awakening.  The current episode started more than one year. The onset quality is gradual. The problem occurs intermittently. PMH includes: depression.      Review of Systems  Constitutional: Positive for fatigue and irritability.  HENT: Positive for hoarse voice.   Psychiatric/Behavioral: Positive for depression. The patient is nervous/anxious and has insomnia.    All other systems reviewed and are negative.      Objective:   Physical Exam Vitals reviewed.  Constitutional:      General: She is irritable. She is not in acute distress.    Appearance: She is well-developed and well-nourished.  HENT:     Head: Normocephalic and atraumatic.     Right Ear: Tympanic membrane normal.     Left Ear: Tympanic membrane normal.     Mouth/Throat:     Mouth: Oropharynx is clear and moist.  Eyes:     Pupils: Pupils are equal, round, and reactive to light.  Neck:     Thyroid: No thyromegaly.  Cardiovascular:     Rate and Rhythm: Normal rate and regular rhythm.     Pulses: Intact distal pulses.     Heart sounds: Normal heart sounds. No murmur heard.   Pulmonary:     Effort: Pulmonary effort is normal. No respiratory distress.     Breath sounds: Normal breath sounds. No wheezing.  Abdominal:     General: Bowel sounds are normal. There is no distension.     Palpations: Abdomen is soft.     Tenderness: There is no abdominal tenderness.  Musculoskeletal:        General: No tenderness or edema. Normal range of motion.     Cervical back: Normal range of motion and neck supple.  Skin:    General: Skin is warm and dry.  Neurological:     Mental Status: She is alert and oriented to person, place, and time.     Cranial Nerves: No  cranial nerve deficit.     Deep Tendon Reflexes: Reflexes are normal and symmetric.  Psychiatric:        Mood and Affect: Mood is anxious.        Behavior: Behavior normal.        Thought Content: Thought content normal.        Judgment: Judgment normal.       BP 135/84   Pulse 75   Temp 97.7 F (36.5 C) (Temporal)   Ht $R'5\' 4"'IY$  (1.626 m)   Wt 121 lb 12.8 oz (55.2 kg)   SpO2 98%   BMI 20.91 kg/m      Assessment & Plan:  [name] Williams comes in today with chief complaint of Medical Management of Chronic Issues and Depression   Diagnosis and orders addressed:  1. HYPERTENSION, BENIGN  - CMP14+EGFR - CBC with  Differential/Platelet - AMB Referral to [facility]  2. Gastroesophageal reflux disease, unspecified whether esophagitis present - CMP14+EGFR - CBC with Differential/Platelet - AMB Referral to [facility]  3. CLL (chronic lymphocytic leukemia) (HCC) - CMP14+EGFR - CBC with Differential/Platelet - AMB Referral to [facility]  4. GAD (generalized anxiety disorder) - venlafaxine XR (EFFEXOR-XR) 37.5 MG 24 hr capsule; Take 1 capsule (37.5 mg total) by mouth daily with breakfast.  Dispense: 90 capsule; Refill: 2 - busPIRone (BUSPAR) 5 MG tablet; Take 1 tablet (5 mg total) by mouth 2 (two) times daily.  Dispense: 60 tablet; Refill: 4 - CMP14+EGFR - CBC with Differential/Platelet - TSH - AMB Referral to [facility]  5. Recurrent major depressive disorder, in full remission (HCC) - venlafaxine XR (EFFEXOR-XR) 37.5 MG 24 hr capsule; Take 1 capsule (37.5 mg total) by mouth daily with breakfast.  Dispense: 90 capsule; Refill: 2 - CMP14+EGFR - CBC with Differential/Platelet - TSH - AMB Referral to [facility]  6. Memory loss - CMP14+EGFR - CBC with Differential/Platelet - AMB Referral to [facility]  7. Insomnia, unspecified type - CMP14+EGFR - CBC with Differential/Platelet - AMB Referral to [facility] pending Health Maintenance reviewed Diet and exercise encouraged  Follow up plan: 6 months    Evelina Dun, FNP

## 2020-09-13 ENCOUNTER — Telehealth: Payer: Self-pay | Admitting: *Deleted

## 2020-09-13 DIAGNOSIS — F3289 Other specified depressive episodes: Secondary | ICD-10-CM | POA: Diagnosis not present

## 2020-09-13 LAB — CMP14+EGFR
ALT: 13 IU/L (ref 0–32)
AST: 25 IU/L (ref 0–40)
Albumin/Globulin Ratio: 2 (ref 1.2–2.2)
Albumin: 4.5 g/dL (ref 3.5–4.6)
Alkaline Phosphatase: 93 IU/L (ref 44–121)
BUN/Creatinine Ratio: 13 (ref 12–28)
BUN: 16 mg/dL (ref 10–36)
Bilirubin Total: 0.7 mg/dL (ref 0.0–1.2)
CO2: 25 mmol/L (ref 20–29)
Calcium: 9.1 mg/dL (ref 8.7–10.3)
Chloride: 103 mmol/L (ref 96–106)
Creatinine, Ser: 1.2 mg/dL — ABNORMAL HIGH (ref 0.57–1.00)
GFR calc Af Amer: 46 mL/min/{1.73_m2} — ABNORMAL LOW (ref >59)
GFR calc non Af Amer: 40 mL/min/{1.73_m2} — ABNORMAL LOW (ref >59)
Globulin, Total: 2.3 g/dL (ref 1.5–4.5)
Glucose: 101 mg/dL — ABNORMAL HIGH (ref 65–99)
Potassium: 4.7 mmol/L (ref 3.5–5.2)
Sodium: 141 mmol/L (ref 134–144)
Total Protein: 6.8 g/dL (ref 6.0–8.5)

## 2020-09-13 LAB — CBC WITH DIFFERENTIAL/PLATELET
Basophils Absolute: 0.1 10*3/uL (ref 0.0–0.2)
Basos: 1 %
EOS (ABSOLUTE): 0.1 10*3/uL (ref 0.0–0.4)
Eos: 1 %
Hematocrit: 46.2 % (ref 34.0–46.6)
Hemoglobin: 16.1 g/dL — ABNORMAL HIGH (ref 11.1–15.9)
Immature Grans (Abs): 0 10*3/uL (ref 0.0–0.1)
Immature Granulocytes: 0 %
Lymphocytes Absolute: 7.3 10*3/uL — ABNORMAL HIGH (ref 0.7–3.1)
Lymphs: 66 %
MCH: 32.1 pg (ref 26.6–33.0)
MCHC: 34.8 g/dL (ref 31.5–35.7)
MCV: 92 fL (ref 79–97)
Monocytes Absolute: 0.8 10*3/uL (ref 0.1–0.9)
Monocytes: 8 %
Neutrophils Absolute: 2.7 10*3/uL (ref 1.4–7.0)
Neutrophils: 24 %
Platelets: 137 10*3/uL — ABNORMAL LOW (ref 150–450)
RBC: 5.01 x10E6/uL (ref 3.77–5.28)
RDW: 11.8 % (ref 11.7–15.4)
WBC: 10.9 10*3/uL — ABNORMAL HIGH (ref 3.4–10.8)

## 2020-09-13 LAB — TSH: TSH: 3.69 u[IU]/mL (ref 0.450–4.500)

## 2020-09-13 NOTE — Chronic Care Management (AMB) (Signed)
  Chronic Care Management   Outreach Note  09/13/2020 Name: NGUYEN BUTLER MRN: [id number] DOB: 07-Dec-1927  [name] Williams is a 87 y.o. year old female who is a primary care patient of Sharion Balloon, FNP. I reached out to [employer] by phone today in response to a referral sent by Ms. [name] Williams's PCP, Sharion Balloon, FNP.    An unsuccessful telephone outreach was attempted today. The patient was referred to the case management team for assistance with care management and care coordination.   Follow Up Plan: A HIPAA compliant phone message was left for the patient providing contact information and requesting a return call. The care management team will reach out to the patient again over the next 7 days. If patient returns call to provider office, please advise to call [facility] at [phone].  [facility] Management

## 2020-09-15 NOTE — Chronic Care Management (AMB) (Signed)
  Chronic Care Management   Note  09/15/2020 Name: BATYA CITRON MRN: [id number] DOB: [birth year]  Cloretta Ned Flurry is a 87 y.o. year old female who is a primary care patient of Sharion Balloon, FNP. I reached out to [employer] by phone today in response to a referral sent by Ms. Cheri Nembhard's PCP,Hawks, Theador Hawthorne, FNP  Ms. Crusoe was given information about Chronic Care Management services today including:  1. CCM service includes personalized support from designated clinical staff supervised by her physician, including individualized plan of care and coordination with other care providers 2. 24/7 contact phone numbers for assistance for urgent and routine care needs. 3. Service will only be billed when office clinical staff spend 20 minutes or more in a month to coordinate care. 4. Only one practitioner may furnish and bill the service in a calendar month. 5. The patient may stop CCM services at any time (effective at the end of the month) by phone call to the office staff. 6. The patient will be responsible for cost sharing (co-pay) of up to 20% of the service fee (after annual deductible is met).  Patient did not agree to enrollment in care management services and does not wish to consider at this time.  Follow up plan: Patient declines further follow up and engagement by the care management team. Appropriate care team members and provider have been notified via electronic communication.   [facility] Management

## 2020-09-16 ENCOUNTER — Ambulatory Visit: Payer: Self-pay | Admitting: Licensed Clinical Social Worker

## 2020-09-16 DIAGNOSIS — F411 Generalized anxiety disorder: Secondary | ICD-10-CM

## 2020-09-16 DIAGNOSIS — G4700 Insomnia, unspecified: Secondary | ICD-10-CM

## 2020-09-16 DIAGNOSIS — I639 Cerebral infarction, unspecified: Secondary | ICD-10-CM

## 2020-09-16 DIAGNOSIS — F3342 Major depressive disorder, recurrent, in full remission: Secondary | ICD-10-CM

## 2020-09-16 DIAGNOSIS — K219 Gastro-esophageal reflux disease without esophagitis: Secondary | ICD-10-CM

## 2020-09-16 DIAGNOSIS — I10 Essential (primary) hypertension: Secondary | ICD-10-CM

## 2020-09-16 DIAGNOSIS — D72820 Lymphocytosis (symptomatic): Secondary | ICD-10-CM

## 2020-09-16 NOTE — Chronic Care Management (AMB) (Signed)
  Chronic Care Management    Clinical Social Work Follow Up Note  09/16/2020 Name: [name] Williams MRN: [id number] DOB: 1928-06-26  [name] Williams is a 87 y.o. year old female who is a primary care patient of Sharion Balloon, FNP. The CCM team was consulted for assistance with [employer] .   Review of patient status, including review of consultants reports, other relevant assessments, and collaboration with appropriate care team members and the patient's provider was performed as part of comprehensive patient evaluation and provision of chronic care management services.    SDOH (Social Determinants of Health) assessments performed: No    Outpatient Encounter Medications as of 09/16/2020  Medication Sig Note  . aspirin 81 MG EC tablet Take 81 mg by mouth every other day.  10/06/2014: Received from: [facility]  . busPIRone (BUSPAR) 5 MG tablet Take 1 tablet (5 mg total) by mouth 2 (two) times daily.   Marland Kitchen venlafaxine XR (EFFEXOR-XR) 37.5 MG 24 hr capsule Take 1 capsule (37.5 mg total) by mouth daily with breakfast.    No facility-administered encounter medications on file as of 09/16/2020.     LCSW received referral recently from [facility] [place] for [employer], DOB [birth year].  LCSW called Dagoberto Reef, daughter of client today and talked with Mariann Laster about CCM program, CCM program support , and about current needs of client. Mariann Laster gave LCSW CCM consent for client to participate in CCM program support . Mariann Laster informed LCSW of home situation of client and challenges in client home situation. Mariann Laster said her disabled brother lives in basement of client home; Mariann Laster said she had called [place] recently to see if representative of APS for adults could do home inspection of client home. Mariann Laster said she talked with DSS APS representative today and APS is trying to schedule a time for home inspection of client's home. LCSW talked with Mariann Laster about client needs. LCSW asked if LCSW could  call Mariann Laster on 09/21/2020 at 3:00 PM to discuss more the needs of client. Mariann Laster agreed for LCSW to call her on 09/21/2020 at 3:00 PM.  LCSW also talked with Mariann Laster about CCM program support services with LCSW and RNCM  Follow Up Plan: LCSW to call Dagoberto Reef on 09/21/2020 at 3:00 PM to discuss further needs of client  [name].Guerry Covington MSW, LCSW Licensed Clinical Social Worker [facility] Care Management [phone]

## 2020-09-16 NOTE — Patient Instructions (Addendum)
Clinical Social Work Note  09/16/2020  Name: [name] Williams MRN: [id number]       DOB: [birth year]  [name] Williams is a 87 y.o. year old female who is a primary care patient of Sharion Balloon, FNP. The CCM team was consulted for assistance with [employer] .   Review of patient status, including review of consultants reports, other relevant assessments, and collaboration with appropriate care team members and the patient's provider was performed as part of comprehensive patient evaluation and provision of chronic care management services.    SDOH (Social Determinants of Health) assessments performed: No   LCSW received referral recently from [place] [place] for [employer], DOB Dec 11, 1927.  LCSW called Dagoberto Reef, daughter of client today and talked with Mariann Laster about CCM program, CCM program support , and about current needs of client. Mariann Laster gave LCSW CCM consent for client to participate in CCM program support . Mariann Laster informed LCSW of home situation of client and challenges in client home situation. Mariann Laster said her disabled brother lives in basement of client home; Mariann Laster said she had called [place] recently to see if representative of APS for adults could do home inspection of client home. Mariann Laster said she talked with DSS APS representative today and APS is trying to schedule a time for home inspection of client's home. LCSW talked with Mariann Laster about client needs. LCSW asked if LCSW could call Mariann Laster on 09/21/2020 at 3:00 PM to discuss more the needs of client. Mariann Laster agreed for LCSW to call her on 09/21/2020 at 3:00 PM.  LCSW also talked with Mariann Laster about CCM program support services with LCSW and RNCM  Follow Up Plan: LCSW to call Dagoberto Reef on 09/21/2020 at 3:00 PM to discuss further needs of client  The patient/[name] Williams, daughter of patient,verrbalized understanding of instructions provided today and declined a print copy of patient instruction materials.   [name].Quintyn Dombek  MSW, LCSW Licensed Clinical Social Worker [facility] Care Management [phone]

## 2020-09-21 ENCOUNTER — Ambulatory Visit (INDEPENDENT_AMBULATORY_CARE_PROVIDER_SITE_OTHER): Payer: Medicare HMO | Admitting: Licensed Clinical Social Worker

## 2020-09-21 DIAGNOSIS — F3342 Major depressive disorder, recurrent, in full remission: Secondary | ICD-10-CM

## 2020-09-21 DIAGNOSIS — G4700 Insomnia, unspecified: Secondary | ICD-10-CM

## 2020-09-21 DIAGNOSIS — I639 Cerebral infarction, unspecified: Secondary | ICD-10-CM

## 2020-09-21 DIAGNOSIS — E785 Hyperlipidemia, unspecified: Secondary | ICD-10-CM

## 2020-09-21 DIAGNOSIS — I10 Essential (primary) hypertension: Secondary | ICD-10-CM

## 2020-09-21 DIAGNOSIS — F411 Generalized anxiety disorder: Secondary | ICD-10-CM

## 2020-09-21 DIAGNOSIS — R69 Illness, unspecified: Secondary | ICD-10-CM | POA: Diagnosis not present

## 2020-09-21 DIAGNOSIS — K219 Gastro-esophageal reflux disease without esophagitis: Secondary | ICD-10-CM

## 2020-09-21 DIAGNOSIS — R413 Other amnesia: Secondary | ICD-10-CM

## 2020-09-21 NOTE — Patient Instructions (Signed)
Licensed Clinical Social Worker Visit Information  Goals we discussed today:  Goals Addressed              This Visit's Progress   .  wants to better manage anxiety and stress issues faced (pt-stated)   Not on track     Timeframe:  Short-Term Goal Priority:  Medium Start Date:         09/21/2020                    Expected End Date:    12/19/2020                   Follow Up Date 10/19/2020    Manage My Emotions (Patient). Manage anxiety and stress issues faced    Why is this important?    When you are stressed, down or upset, your body reacts too.   For example, your blood pressure may get higher; you may have a headache or stomachache.   When your emotions get the best of you, your body's ability to fight off cold and flu gets weak.   These steps will help you manage your emotions.             Materials Provided: No  Follow Up Plan: LCSW to call client on 10/19/2020 to assess client needs at that time  The patient [name] Williams, daughter of patient, verbalized understanding of instructions provided today and declined a print copy of patient instruction materials.   [name].[name] MSW, LCSW Licensed Clinical Social Worker [facility] Care Management [phone]

## 2020-09-21 NOTE — Chronic Care Management (AMB) (Signed)
Chronic Care Management    Clinical Social Work Follow Up Note  09/21/2020 Name: [name] Williams MRN: [id number] DOB: 02/02/1928  [name] Williams is a 87 y.o. year old female who is a primary care patient of Sharion Balloon, FNP. The CCM team was consulted for assistance with [employer] .   Review of patient status, including review of consultants reports, other relevant assessments, and collaboration with appropriate care team members and the patient's provider was performed as part of comprehensive patient evaluation and provision of chronic care management services.    SDOH (Social Determinants of Health) assessments performed: Yes; risk for stress; risk for depression; risk for physical inactivity; risk for tobacco use  Flowsheet Row Chronic Care Management from 09/21/2020 in [place] [facility]  PHQ-9 Total Score 6      GAD 7 : Generalized Anxiety Score 09/21/2020 09/22/2019 05/31/2016  Nervous, Anxious, on Edge 1 3 1   Control/stop worrying 1 3 1   Worry too much - different things 1 3 1   Trouble relaxing 1 3 1   Restless 1 0 0  Easily annoyed or irritable 1 0 1  Afraid - awful might happen 1 2 1   Total GAD 7 Score 7 14 6   Anxiety Difficulty Somewhat difficult - Somewhat difficult    Outpatient Encounter Medications as of 09/21/2020  Medication Sig Note  . aspirin 81 MG EC tablet Take 81 mg by mouth every other day.  10/06/2014: Received from: [facility]  . busPIRone (BUSPAR) 5 MG tablet Take 1 tablet (5 mg total) by mouth 2 (two) times daily.   Marland Kitchen venlafaxine XR (EFFEXOR-XR) 37.5 MG 24 hr capsule Take 1 capsule (37.5 mg total) by mouth daily with breakfast.    No facility-administered encounter medications on file as of 09/21/2020.     Goals Addressed              This Visit's Progress   .  wants to better manage anxiety and stress issues faced (pt-stated)   Not on track     Timeframe:  Short-Term Goal Priority:  Medium Start Date:         09/21/2020                     Expected End Date:    12/19/2020                   Follow Up Date 10/19/2020      Current Barriers:  Marland Kitchen Memory issues . Family stress issues . Suicidal Ideation/Homicidal Ideation: No  Clinical Social Work Goal(s):  Marland Kitchen Over the next 30 days, patient will work with SW monthly by telephone or in person to reduce or manage symptoms related to  anxiety and stress issues faced . LCSW will call client in next 30 days to discuss with client strategies for managing anxiety and stress issues faced by client  Interventions: . Patient interviewed and appropriate assessments performed:  . Talked with Dagoberto Reef, daughter of patient, about client needs . Talked with Mariann Laster about home living situation of client . Talked with Mariann Laster about needs of client's son, who resides in basement of client's home . Talked with Mariann Laster about medication administration of client . Talked with Mariann Laster about client techniques of managing stress or anxiety issues faced   Patient Self Care Activities:  . Attends scheduled medical appointments . Manages her own finances . Takes medications as prescribed  Patient Coping Strengths:  . Has support from her  daughter, Dagoberto Reef  Patient Self Care Deficits:  Marland Kitchen Memory issues  Patient Goals: . Communicate with LCSW in next 30 days to discuss strategies for managing anxiety and stress issues faced  . Manage My Emotions (Patient); Manage anxiety and stress issues faced      Why is this important?      When you are stressed, down or upset your body reacts too      For example, your blood pressure may get higher, you may have a headache or            stomachache       When you emotions get the best of you, your body's ability to fight off cold and flu gets weak        These steps can help you manage your emotions        Follow Up Plan: LCSW to call client on 10/19/2020 to assess client needs at that time  [name].Chandler Swiderski MSW, LCSW Licensed Clinical  Social Worker [facility] Care Management [phone]

## 2020-10-19 ENCOUNTER — Ambulatory Visit (INDEPENDENT_AMBULATORY_CARE_PROVIDER_SITE_OTHER): Payer: Medicare HMO | Admitting: Licensed Clinical Social Worker

## 2020-10-19 DIAGNOSIS — F3342 Major depressive disorder, recurrent, in full remission: Secondary | ICD-10-CM

## 2020-10-19 DIAGNOSIS — I639 Cerebral infarction, unspecified: Secondary | ICD-10-CM

## 2020-10-19 DIAGNOSIS — F411 Generalized anxiety disorder: Secondary | ICD-10-CM

## 2020-10-19 DIAGNOSIS — K219 Gastro-esophageal reflux disease without esophagitis: Secondary | ICD-10-CM

## 2020-10-19 DIAGNOSIS — E785 Hyperlipidemia, unspecified: Secondary | ICD-10-CM

## 2020-10-19 DIAGNOSIS — R69 Illness, unspecified: Secondary | ICD-10-CM | POA: Diagnosis not present

## 2020-10-19 DIAGNOSIS — I10 Essential (primary) hypertension: Secondary | ICD-10-CM

## 2020-10-19 DIAGNOSIS — G4700 Insomnia, unspecified: Secondary | ICD-10-CM

## 2020-10-19 NOTE — Patient Instructions (Signed)
Licensed Clinical Social Worker Visit Information  Goals we discussed today:  Goals Addressed              This Visit's Progress   .  wants to better manage anxiety and stress issues faced (pt-stated)        Timeframe:  Short-Term Goal Priority:  Medium Start Date:         10/19/2020                  Expected End Date:   01/19/2021                   Follow Up Date 11/19/2020    Manage My Emotions (Patient). Manage anxiety and stress issues faced    Why is this important?    When you are stressed, down or upset, your body reacts too.   For example, your blood pressure may get higher; you may have a headache or stomachache.   When your emotions get the best of you, your body's ability to fight off cold and flu gets weak.   These steps will help you manage your emotions.             Materials Provided: No  Follow Up Date; LCSW to call client or family representative on 11/19/2020 to discuss client needs at that time  The patient/ [name] Williams, son of client,  verbalized understanding of instructions provided today and declined a print copy of patient instruction materials.   [name].Oluwakemi Salsberry MSW, LCSW Licensed Clinical Social Worker [facility] Care Management [phone]

## 2020-10-19 NOTE — Chronic Care Management (AMB) (Signed)
Chronic Care Management    Clinical Social Work Follow Up Note  10/19/2020 Name: [name] Williams MRN: [id number] DOB: 11-27-1927  [name] Williams is a 87 y.o. year old female who is a primary care patient of Sharion Balloon, FNP. The CCM team was consulted for assistance with [employer] .   Review of patient status, including review of consultants reports, other relevant assessments, and collaboration with appropriate care team members and the patient's provider was performed as part of comprehensive patient evaluation and provision of chronic care management services.    SDOH (Social Determinants of Health) assessments performed: No; risk for depression; risk for financial strain; risk for stress; risk for physical inactivity  Flowsheet Row Chronic Care Management from 09/21/2020 in [place]  PHQ-9 Total Score 6     GAD 7 : Generalized Anxiety Score 09/21/2020 09/22/2019 05/31/2016  Nervous, Anxious, on Edge 1 3 1   Control/stop worrying 1 3 1   Worry too much - different things 1 3 1   Trouble relaxing 1 3 1   Restless 1 0 0  Easily annoyed or irritable 1 0 1  Afraid - awful might happen 1 2 1   Total GAD 7 Score 7 14 6   Anxiety Difficulty Somewhat difficult - Somewhat difficult    Outpatient Encounter Medications as of 10/19/2020  Medication Sig Note  . aspirin 81 MG EC tablet Take 81 mg by mouth every other day.  10/06/2014: Received from: [facility]  . busPIRone (BUSPAR) 5 MG tablet Take 1 tablet (5 mg total) by mouth 2 (two) times daily.   Marland Kitchen venlafaxine XR (EFFEXOR-XR) 37.5 MG 24 hr capsule Take 1 capsule (37.5 mg total) by mouth daily with breakfast.    No facility-administered encounter medications on file as of 10/19/2020.     Goals Addressed              This Visit's Progress   .  wants to better manage anxiety and stress issues faced (pt-stated)        Timeframe:  Short-Term Goal Priority:  Medium Start Date:         10/19/2020                   Expected End Date:   01/19/2021                   Follow Up Date 11/19/2020    Current Barriers:  Marland Kitchen Memory issues . Family stress issues . Suicidal Ideation/Homicidal Ideation: No  Clinical Social Work Goal(s):  Marland Kitchen Over the next 30 days, patient will work with SW monthly by telephone or in person to reduce or manage symptoms related to  anxiety and stress issues faced . LCSW will call client in next 30 days to discuss with client strategies for managing anxiety and stress issues faced by client  Interventions: . Talked with [name] Williams, son of client ,about client needs . Talked with [name] Williams about client needs and food procurement . Talked with [name] Williams about transport needs of client . Talked with [name] Williams about memory issues of client . Talked with [name] Williams about medication administration of client . Talked with [name] Williams about appetite of client (decreased appetite) . Talked with [name] Williams about weight issues of client . Talked with [name] Williams about ambulation of client . Talked with [name] Williams about fall history of client . Talked with [name] Williams about needs of [name] Williams who Williams in basement of client home. . Talked with [name] Williams about sleeping issues of  client . Collaborated with RNCM regarding needs of client   Patient Self Care Activities:  . Attends scheduled medical appointments . Manages her own finances . Takes medications as prescribed  Patient Coping Strengths:  . Has support from her daughter, [name] Williams  Patient Self Care Deficits:  Marland Kitchen Memory issues  Patient Goals: . Communicate with LCSW in next 30 days to discuss strategies for managing anxiety and stress issues faced   Follow Up Plan: LCSW to call client on 11/19/2020 to discuss client needs at that time       [name].Theron Cumbie MSW, LCSW Licensed Clinical Social Worker [facility] Care Management [phone]

## 2020-10-24 ENCOUNTER — Other Ambulatory Visit: Payer: Self-pay | Admitting: Family

## 2020-10-24 DIAGNOSIS — F411 Generalized anxiety disorder: Secondary | ICD-10-CM

## 2020-11-01 ENCOUNTER — Ambulatory Visit: Payer: Medicare HMO | Admitting: *Deleted

## 2020-11-01 DIAGNOSIS — F3342 Major depressive disorder, recurrent, in full remission: Secondary | ICD-10-CM

## 2020-11-01 DIAGNOSIS — F411 Generalized anxiety disorder: Secondary | ICD-10-CM

## 2020-11-01 DIAGNOSIS — I10 Essential (primary) hypertension: Secondary | ICD-10-CM

## 2020-11-01 NOTE — Chronic Care Management (AMB) (Signed)
  Chronic Care Management   Note  11/01/2020 Name: [name] Williams MRN: [id number] DOB: [birth year]  I reached out to [name] by telephone today in response to a consultation by Theadore Nan, LCSW who has talked with Ms [name] Williams's daughter and son. Per patient, "I'm in great health and I don't need any extra help." Chart review of office notes, LCSW notes, medication list, and problem list indicate that depression, anxiety, and family stressors are her primary problems at this time. Patient was not interested in nursing assistance or follow-up calls from [facility]. RN Care Manager will relay this information to Theadore Nan, LCSW, whom I expect will continue to follow-up with patient/family regarding anxiety, depression, and other psychosocial concerns. RN Care Manager will be glad to work with patient in the future if patient, PCP, or family feel it is necessary.   Patient Active Problem List   Diagnosis Date Noted  . CLL (chronic lymphocytic leukemia) ([place]) 04/07/2019  . Leukocytosis 03/12/2019  . Insomnia 01/07/2015  . Osteoporosis 01/07/2015  . GAD (generalized anxiety disorder) 11/24/2014  . Dermatitis, purulent 04/08/2014  . Memory loss 01/28/2014  . Fatigue 01/07/2014  . Cerebral vascular accident ([place]) 12/03/2013  . BRADYCARDIA 03/15/2010  . SYNCOPE AND COLLAPSE 03/15/2010  . Depression 09/14/2009  . Transient cerebral ischemia 09/14/2009  . GERD 09/14/2009  . Hyperlipidemia 07/14/2008  . HYPERTENSION, BENIGN 07/14/2008  . Hypertrophic obstructive cardiomyopathy(425.11) 07/14/2008   Allergies as of 11/01/2020      Reactions   Latex    Per pt 11-10-14 she do not remember being allergic to this.../or      Medication List       Accurate as of November 01, 2020 11:46 AM. If you have any questions, ask your nurse or doctor.        aspirin 81 MG EC tablet Take 81 mg by mouth every other day.   busPIRone 5 MG tablet Commonly known as: BUSPAR Take 1 tablet (5 mg total) by mouth  2 (two) times daily.   venlafaxine XR 37.5 MG 24 hr capsule Commonly known as: EFFEXOR-XR Take 1 capsule (37.5 mg total) by mouth daily with breakfast.       Follow up plan: Telephone follow up appointment with care management team member scheduled for: LCSW 11/21/20 Next PCP appointment scheduled for: 12/12/20 with Evelina Dun, FNP  Chong Sicilian, BSN, RN-BC [place] [facility] [place] Management Direct Dial: [phone]

## 2020-11-01 NOTE — Patient Instructions (Signed)
Follow up plan: Telephone follow up appointment with care management team member scheduled for: LCSW 11/21/20 Next PCP appointment scheduled for: 12/12/20 with Evelina Dun, FNP  Chong Sicilian, BSN, RN-BC [place] [facility] [place] Management Direct Dial: [phone]

## 2020-11-03 ENCOUNTER — Other Ambulatory Visit: Payer: Self-pay

## 2020-11-03 ENCOUNTER — Inpatient Hospital Stay (HOSPITAL_COMMUNITY): Payer: Medicare HMO | Attending: Hematology

## 2020-11-03 DIAGNOSIS — Z9049 Acquired absence of other specified parts of digestive tract: Secondary | ICD-10-CM | POA: Diagnosis not present

## 2020-11-03 DIAGNOSIS — Z818 Family history of other mental and behavioral disorders: Secondary | ICD-10-CM | POA: Diagnosis not present

## 2020-11-03 DIAGNOSIS — R911 Solitary pulmonary nodule: Secondary | ICD-10-CM | POA: Diagnosis not present

## 2020-11-03 DIAGNOSIS — Z8049 Family history of malignant neoplasm of other genital organs: Secondary | ICD-10-CM | POA: Insufficient documentation

## 2020-11-03 DIAGNOSIS — I129 Hypertensive chronic kidney disease with stage 1 through stage 4 chronic kidney disease, or unspecified chronic kidney disease: Secondary | ICD-10-CM | POA: Insufficient documentation

## 2020-11-03 DIAGNOSIS — Z809 Family history of malignant neoplasm, unspecified: Secondary | ICD-10-CM | POA: Insufficient documentation

## 2020-11-03 DIAGNOSIS — Z800 Family history of malignant neoplasm of digestive organs: Secondary | ICD-10-CM | POA: Insufficient documentation

## 2020-11-03 DIAGNOSIS — G479 Sleep disorder, unspecified: Secondary | ICD-10-CM | POA: Diagnosis not present

## 2020-11-03 DIAGNOSIS — Z803 Family history of malignant neoplasm of breast: Secondary | ICD-10-CM | POA: Insufficient documentation

## 2020-11-03 DIAGNOSIS — N189 Chronic kidney disease, unspecified: Secondary | ICD-10-CM | POA: Diagnosis not present

## 2020-11-03 DIAGNOSIS — E559 Vitamin D deficiency, unspecified: Secondary | ICD-10-CM | POA: Insufficient documentation

## 2020-11-03 DIAGNOSIS — Z811 Family history of alcohol abuse and dependence: Secondary | ICD-10-CM | POA: Insufficient documentation

## 2020-11-03 DIAGNOSIS — R5383 Other fatigue: Secondary | ICD-10-CM | POA: Diagnosis not present

## 2020-11-03 DIAGNOSIS — K219 Gastro-esophageal reflux disease without esophagitis: Secondary | ICD-10-CM | POA: Insufficient documentation

## 2020-11-03 DIAGNOSIS — Z8673 Personal history of transient ischemic attack (TIA), and cerebral infarction without residual deficits: Secondary | ICD-10-CM | POA: Insufficient documentation

## 2020-11-03 DIAGNOSIS — Z87891 Personal history of nicotine dependence: Secondary | ICD-10-CM | POA: Diagnosis not present

## 2020-11-03 DIAGNOSIS — Z79899 Other long term (current) drug therapy: Secondary | ICD-10-CM | POA: Diagnosis not present

## 2020-11-03 DIAGNOSIS — R630 Anorexia: Secondary | ICD-10-CM | POA: Diagnosis not present

## 2020-11-03 DIAGNOSIS — E785 Hyperlipidemia, unspecified: Secondary | ICD-10-CM | POA: Diagnosis not present

## 2020-11-03 DIAGNOSIS — C9110 Chronic lymphocytic leukemia of B-cell type not having achieved remission: Secondary | ICD-10-CM | POA: Insufficient documentation

## 2020-11-03 DIAGNOSIS — R634 Abnormal weight loss: Secondary | ICD-10-CM | POA: Insufficient documentation

## 2020-11-03 DIAGNOSIS — R590 Localized enlarged lymph nodes: Secondary | ICD-10-CM | POA: Diagnosis not present

## 2020-11-03 LAB — COMPREHENSIVE METABOLIC PANEL
ALT: 12 U/L (ref 0–44)
AST: 19 U/L (ref 15–41)
Albumin: 3.8 g/dL (ref 3.5–5.0)
Alkaline Phosphatase: 72 U/L (ref 38–126)
Anion gap: 8 (ref 5–15)
BUN: 17 mg/dL (ref 8–23)
CO2: 27 mmol/L (ref 22–32)
Calcium: 9 mg/dL (ref 8.9–10.3)
Chloride: 100 mmol/L (ref 98–111)
Creatinine, Ser: 1.12 mg/dL — ABNORMAL HIGH (ref 0.44–1.00)
GFR, Estimated: 47 mL/min — ABNORMAL LOW (ref >60)
Glucose, Bld: 97 mg/dL (ref 70–99)
Potassium: 4.2 mmol/L (ref 3.5–5.1)
Sodium: 135 mmol/L (ref 135–145)
Total Bilirubin: 1.1 mg/dL (ref 0.3–1.2)
Total Protein: 6.9 g/dL (ref 6.5–8.1)

## 2020-11-03 LAB — CBC WITH DIFFERENTIAL/PLATELET
Abs Immature Granulocytes: 0.03 10*3/uL (ref 0.00–0.07)
Basophils Absolute: 0.1 10*3/uL (ref 0.0–0.1)
Basophils Relative: 1 %
Eosinophils Absolute: 0.2 10*3/uL (ref 0.0–0.5)
Eosinophils Relative: 1 %
HCT: 43.7 % (ref 36.0–46.0)
Hemoglobin: 13.8 g/dL (ref 12.0–15.0)
Immature Granulocytes: 0 %
Lymphocytes Relative: 57 %
Lymphs Abs: 7.7 10*3/uL — ABNORMAL HIGH (ref 0.7–4.0)
MCH: 30.5 pg (ref 26.0–34.0)
MCHC: 31.6 g/dL (ref 30.0–36.0)
MCV: 96.5 fL (ref 80.0–100.0)
Monocytes Absolute: 1.1 10*3/uL — ABNORMAL HIGH (ref 0.1–1.0)
Monocytes Relative: 8 %
Neutro Abs: 4.6 10*3/uL (ref 1.7–7.7)
Neutrophils Relative %: 33 %
Platelets: 185 10*3/uL (ref 150–400)
RBC: 4.53 MIL/uL (ref 3.87–5.11)
RDW: 13 % (ref 11.5–15.5)
WBC: 13.6 10*3/uL — ABNORMAL HIGH (ref 4.0–10.5)
nRBC: 0 % (ref 0.0–0.2)

## 2020-11-03 LAB — VITAMIN D 25 HYDROXY (VIT D DEFICIENCY, FRACTURES): Vit D, 25-Hydroxy: 18.03 ng/mL — ABNORMAL LOW (ref 30–100)

## 2020-11-03 LAB — LACTATE DEHYDROGENASE: LDH: 137 U/L (ref 98–192)

## 2020-11-03 LAB — VITAMIN B12: Vitamin B-12: 252 pg/mL (ref 180–914)

## 2020-11-10 ENCOUNTER — Inpatient Hospital Stay (HOSPITAL_COMMUNITY): Payer: Medicare HMO | Admitting: Hematology

## 2020-11-10 ENCOUNTER — Other Ambulatory Visit: Payer: Self-pay

## 2020-11-10 VITALS — BP 130/75 | HR 79 | Temp 97.0°F | Resp 16 | Wt 121.6 lb

## 2020-11-10 DIAGNOSIS — N189 Chronic kidney disease, unspecified: Secondary | ICD-10-CM | POA: Diagnosis not present

## 2020-11-10 DIAGNOSIS — K219 Gastro-esophageal reflux disease without esophagitis: Secondary | ICD-10-CM | POA: Diagnosis not present

## 2020-11-10 DIAGNOSIS — C9110 Chronic lymphocytic leukemia of B-cell type not having achieved remission: Secondary | ICD-10-CM

## 2020-11-10 DIAGNOSIS — G479 Sleep disorder, unspecified: Secondary | ICD-10-CM | POA: Diagnosis not present

## 2020-11-10 DIAGNOSIS — R630 Anorexia: Secondary | ICD-10-CM | POA: Diagnosis not present

## 2020-11-10 DIAGNOSIS — I129 Hypertensive chronic kidney disease with stage 1 through stage 4 chronic kidney disease, or unspecified chronic kidney disease: Secondary | ICD-10-CM | POA: Diagnosis not present

## 2020-11-10 DIAGNOSIS — E559 Vitamin D deficiency, unspecified: Secondary | ICD-10-CM | POA: Diagnosis not present

## 2020-11-10 DIAGNOSIS — R5383 Other fatigue: Secondary | ICD-10-CM | POA: Diagnosis not present

## 2020-11-10 DIAGNOSIS — R634 Abnormal weight loss: Secondary | ICD-10-CM | POA: Diagnosis not present

## 2020-11-10 DIAGNOSIS — E785 Hyperlipidemia, unspecified: Secondary | ICD-10-CM | POA: Diagnosis not present

## 2020-11-10 MED ORDER — ERGOCALCIFEROL 1.25 MG (50000 UT) PO CAPS: 50000.0000 [IU] | ORAL_CAPSULE | ORAL | 2 refills | Status: DC

## 2020-11-10 NOTE — Patient Instructions (Signed)
[facility] Discharge Instructions  You were seen today by Dr. Katragadda. He went over your recent results. You will be prescribed vitamin D 50,000 units to take once a week. Your next appointment will be with the physician assistant in 6 months for labs and follow up.   Thank you for choosing  Cancer Center at [facility] to provide your oncology and hematology care.  To afford each patient quality time with our provider, please arrive at least 15 minutes before your scheduled appointment time.   If you have a lab appointment with the Cancer Center please come in thru the Main Entrance and check in at the main information desk  You need to re-schedule your appointment should you arrive 10 or more minutes late.  We strive to give you quality time with our providers, and arriving late affects you and other patients whose appointments are after yours.  Also, if you no show three or more times for appointments you may be dismissed from the clinic at the providers discretion.     Again, thank you for choosing [facility].  Our hope is that these requests will decrease the amount of time that you wait before being seen by our physicians.       _____________________________________________________________  Should you have questions after your visit to [facility], please contact our office at [phone] between the hours of 8:00 a.m. and 4:30 p.m.  Voicemails left after 4:00 p.m. will not be returned until the following business day.  For prescription refill requests, have your pharmacy contact our office and allow 72 hours.    Cancer Center Support Programs:   > Cancer Support Group  2nd Tuesday of the month 1pm-2pm, Journey Room    

## 2020-11-10 NOTE — Progress Notes (Signed)
[address], [place] [address]   CLINIC:  Medical Oncology/Hematology  PCP:  Sharion Balloon, [place] / [place] [address]  [phone]  REASON FOR VISIT:  Follow-up for CLL  PRIOR THERAPY: None  CURRENT THERAPY: Observation  INTERVAL HISTORY:  [name] Williams, a 87 y.o. female, returns for routine follow-up for her CLL. [name] Williams was last seen on 05/09/2020.  Today she is accompanied by her daughter and she reports feeling okay. She denies having any recent infections, F/C, night sweats, or weight loss. She is drinking 1 can of Ensure daily and drinks a lot of fruit smoothies. She stopped taking Marinol since it was expensive. She takes Buspar daily and ASA inconsistently; she is not taking vitamin D. She denies having any new aches or pains.    REVIEW OF SYSTEMS:  Review of Systems  Constitutional: Positive for appetite change (75%) and fatigue (25%). Negative for chills, diaphoresis, fever and unexpected weight change.  Musculoskeletal: Negative for arthralgias and myalgias.  Psychiatric/Behavioral: Positive for sleep disturbance.  All other systems reviewed and are negative.   PAST MEDICAL/SURGICAL HISTORY:  Past Medical History:  Diagnosis Date  . Anxiety   . Depressive disorder, not elsewhere classified   . Esophageal reflux   . Essential hypertension, benign   . Hypertr obst cardiomyop   . Other and unspecified hyperlipidemia   . Stroke ([place])   . Unspecified transient cerebral ischemia    Past Surgical History:  Procedure Laterality Date  . BLADDER REPAIR    . CERVIX SURGERY    . CHOLECYSTECTOMY    . TONSILLECTOMY    . TUBAL LIGATION      SOCIAL HISTORY:  Social History   Socioeconomic History  . Marital status: Widowed    Spouse name: Not on file  . Number of children: 3  . Years of education: Not on file  . Highest education level: 9th grade  Occupational History  . Not on file  Tobacco  Use  . Smoking status: Former Research scientist (life sciences)  . Smokeless tobacco: Never Used  . Tobacco comment: quit 2012  Vaping Use  . Vaping Use: Never used  Substance and Sexual Activity  . Alcohol use: No  . Drug use: No  . Sexual activity: Not Currently  Other Topics Concern  . Not on file  Social History Narrative   Patient is retired.    Patient is widowed.    Patient has 3 children.    Patient is right handed.    Pt lives with son.     Social Determinants of Health   Financial Resource Strain: Not on file  Food Insecurity: Not on file  Transportation Needs: No Transportation Needs  . Lack of Transportation (Medical): No  . Lack of Transportation (Non-Medical): No  Physical Activity: Not on file  Stress: Stress Concern Present  . Feeling of Stress : Very much  Social Connections: Not on file  Intimate Partner Violence: Not on file    FAMILY HISTORY:  Family History  Problem Relation Age of Onset  . Breast cancer Mother   . Stomach cancer Sister   . Uterine cancer Sister   . Depression Sister   . Cancer Brother   . Alcohol abuse Son     CURRENT MEDICATIONS:  Current Outpatient Medications  Medication Sig Dispense Refill  . aspirin 81 MG EC tablet Take 81 mg by mouth every other day.     Marland Kitchen  busPIRone (BUSPAR) 5 MG tablet Take 1 tablet (5 mg total) by mouth 2 (two) times daily. 60 tablet 4  . busPIRone (BUSPAR) 7.5 MG tablet Take 7.5 mg by mouth 2 (two) times daily.    . ergocalciferol (VITAMIN D2) 1.25 MG (50000 UT) capsule Take 1 capsule (50,000 Units total) by mouth once a week. 12 capsule 2  . venlafaxine XR (EFFEXOR-XR) 37.5 MG 24 hr capsule Take 1 capsule (37.5 mg total) by mouth daily with breakfast. 90 capsule 2   No current facility-administered medications for this visit.    ALLERGIES:  Allergies  Allergen Reactions  . Latex     Per pt 11-10-14 she do not remember being allergic to this.../or    PHYSICAL EXAM:  Performance status (ECOG): 1 - Symptomatic but  completely ambulatory  Vitals:   11/10/20 1114  BP: 130/75  Pulse: 79  Resp: 16  Temp: (!) 97 F (36.1 C)  SpO2: 98%   Wt Readings from Last 3 Encounters:  11/10/20 121 lb 9.6 oz (55.2 kg)  09/12/20 121 lb 12.8 oz (55.2 kg)  05/09/20 119 lb 3.2 oz (54.1 kg)   Physical Exam Vitals reviewed.  Constitutional:      Appearance: Normal appearance.  Cardiovascular:     Rate and Rhythm: Normal rate and regular rhythm.     Pulses: Normal pulses.     Heart sounds: Normal heart sounds.  Pulmonary:     Effort: Pulmonary effort is normal.     Breath sounds: Normal breath sounds.  Chest:  Breasts:     Right: No axillary adenopathy or supraclavicular adenopathy.     Left: No axillary adenopathy or supraclavicular adenopathy.    Abdominal:     Palpations: Abdomen is soft. There is no hepatomegaly, splenomegaly or mass.     Tenderness: There is no abdominal tenderness.     Hernia: No hernia is present.  Musculoskeletal:     Right lower leg: No edema.     Left lower leg: No edema.  Lymphadenopathy:     Cervical: No cervical adenopathy.     Upper Body:     Right upper body: No supraclavicular, axillary or pectoral adenopathy.     Left upper body: No supraclavicular, axillary or pectoral adenopathy.     Lower Body: No right inguinal adenopathy. No left inguinal adenopathy.  Neurological:     General: No focal deficit present.     Mental Status: She is alert and oriented to person, place, and time.  Psychiatric:        Mood and Affect: Mood normal.        Behavior: Behavior normal.     LABORATORY DATA:  I have reviewed the labs as listed.  CBC Latest Ref Rng & Units 11/03/2020 09/12/2020 05/02/2020  WBC 4.0 - 10.5 Williams/uL 13.6(H) 10.9(H) 15.5(H)  Hemoglobin 12.0 - 15.0 g/dL 13.8 16.1(H) 14.2  Hematocrit 36.0 - 46.0 % 43.7 46.2 45.2  Platelets 150 - 400 Williams/uL 185 137(L) 287   CMP Latest Ref Rng & Units 11/03/2020 09/12/2020 05/02/2020  Glucose 70 - 99 mg/dL 97 101(H) 114(H)  BUN 8 -  23 mg/dL $Remove'17 16 15  'GwIKNfD$ Creatinine 0.44 - 1.00 mg/dL 1.12(H) 1.20(H) 1.16(H)  Sodium 135 - 145 mmol/L 135 141 139  Potassium 3.5 - 5.1 mmol/L 4.2 4.7 4.2  Chloride 98 - 111 mmol/L 100 103 100  CO2 22 - 32 mmol/L $RemoveB'27 25 29  'JOfiIXAU$ Calcium 8.9 - 10.3 mg/dL 9.0 9.1 9.2  Total Protein 6.5 -  8.1 g/dL 6.9 6.8 6.3(L)  Total Bilirubin 0.3 - 1.2 mg/dL 1.1 0.7 0.8  Alkaline Phos 38 - 126 U/L 72 93 74  AST 15 - 41 U/L $Remo'19 25 20  'dxtKy$ ALT 0 - 44 U/L $Remo'12 13 18      'yMSZW$ Component Value Date/Time   RBC 4.53 11/03/2020 1309   MCV 96.5 11/03/2020 1309   MCV 92 09/12/2020 1204   MCH 30.5 11/03/2020 1309   MCHC 31.6 11/03/2020 1309   RDW 13.0 11/03/2020 1309   RDW 11.8 09/12/2020 1204   LYMPHSABS 7.7 (H) 11/03/2020 1309   LYMPHSABS 7.3 (H) 09/12/2020 1204   MONOABS 1.1 (H) 11/03/2020 1309   EOSABS 0.2 11/03/2020 1309   EOSABS 0.1 09/12/2020 1204   BASOSABS 0.1 11/03/2020 1309   BASOSABS 0.1 09/12/2020 1204   Lab Results  Component Value Date   LDH 137 11/03/2020   LDH 132 05/02/2020   LDH 148 11/25/2019   Lab Results  Component Value Date   VD25OH 18.03 (L) 11/03/2020   VD25OH 23.88 (L) 05/02/2020   VD25OH 33.3 10/06/2014    DIAGNOSTIC IMAGING:  I have independently reviewed the scans and discussed with the patient. No results found.   ASSESSMENT:  1.CLL, IGHV mutated, Tp53 negative: -She had elevated white count since 2009. No fevers or night sweats. Almost 20 pound weight loss in the last 2 months. -Peripheral blood flow cytometry was consistent with CLL. -Physical examination did not show any splenomegaly or lymphadenopathy. -CT CAP on 04/01/2019 showed mild mediastinal and bilateral hilar adenopathy measuring up to 11 mm. Noncalcified pulmonary nodule seen in the right upper lobe. -CLL FISH panel showed trisomy 12 and 13 q. deletion.  2.Weight loss: -18 to 20 pound weight loss in the last 2 months since a tick bite. -Lyme disease and RMSF antibody panel was negative. Alpha gal panel showed  low positivityfor beef and pork IgE. -Currently on mirtazapine at bedtime which is not helping with appetite.  3.CKD: -She had mild CKD since February 2016.  4.Health maintenance: -Last colonoscopy was 05/2002 which showed normal rectum and diverticular disease.   PLAN:  1.CLL, IGHV mutated, Tp53 negative: -She denies any fevers, night sweats or weight loss. -No palpable adenopathy or splenomegaly. -Reviewed labs from 11/03/2020 which showed white count 13.6 with predominantly lymphocytes.  Hemoglobin and platelets are normal.  LDH was normal. -We will continue to monitor at 37-month intervals with repeat labs and physical exam.  2.  Loss of appetite: -Mirtazapine did not help with appetite.  Marinol was discontinued due to high co-pay.  She is drinking 1 can of boost per day.  Weight has been stable.  3.  Vitamin D deficiency: -Vitamin D level is low at 18.  We will start her on vitamin D 50,000 units weekly.  We will check her level in 6 months.  4.  Depression: -Continue venlafaxine and BuSpar.  Orders placed this encounter:  Orders Placed This Encounter  Procedures  . CBC with Differential/Platelet  . Comprehensive metabolic panel  . Lactate dehydrogenase  . VITAMIN D 25 Hydroxy (Vit-D Deficiency, Fractures)     Derek Jack, MD [place] [phone]   I, Milinda Antis, am acting as a scribe for Dr. Sanda Linger.  I, Derek Jack MD, have reviewed the above documentation for accuracy and completeness, and I agree with the above.

## 2020-11-21 ENCOUNTER — Ambulatory Visit (INDEPENDENT_AMBULATORY_CARE_PROVIDER_SITE_OTHER): Payer: Medicare HMO | Admitting: Licensed Clinical Social Worker

## 2020-11-21 DIAGNOSIS — G4700 Insomnia, unspecified: Secondary | ICD-10-CM

## 2020-11-21 DIAGNOSIS — R69 Illness, unspecified: Secondary | ICD-10-CM | POA: Diagnosis not present

## 2020-11-21 DIAGNOSIS — I10 Essential (primary) hypertension: Secondary | ICD-10-CM

## 2020-11-21 DIAGNOSIS — E785 Hyperlipidemia, unspecified: Secondary | ICD-10-CM | POA: Diagnosis not present

## 2020-11-21 DIAGNOSIS — I639 Cerebral infarction, unspecified: Secondary | ICD-10-CM

## 2020-11-21 DIAGNOSIS — F3342 Major depressive disorder, recurrent, in full remission: Secondary | ICD-10-CM

## 2020-11-21 DIAGNOSIS — F411 Generalized anxiety disorder: Secondary | ICD-10-CM

## 2020-11-21 DIAGNOSIS — K219 Gastro-esophageal reflux disease without esophagitis: Secondary | ICD-10-CM

## 2020-11-21 NOTE — Patient Instructions (Signed)
Visit Information  PATIENT GOALS: Goals Addressed              This Visit's Progress   .  wants to better manage anxiety and stress issues faced (pt-stated)        Timeframe:  Short-Term Goal Priority:  Medium Start Date:         11/21/20                  Expected End Date:   02/19/21                   Follow Up Date  12/26/20   Manage My Emotions (Patient). Manage anxiety and stress issues faced    Why is this important?    When you are stressed, down or upset, your body reacts too.   For example, your blood pressure may get higher; you may have a headache or stomachache.   When your emotions get the best of you, your body's ability to fight off cold and flu gets weak.   These steps will help you manage your emotions.     Patient Self Care Activities:  . Attends all scheduled provider appointments  Patient Coping Strengths:  . Family is supportive . Eats meals with set up assistance . Allows time to rest and relax  Patient Self Care Deficits:  . Decreased appetite  Patient Goals:  - spend time or talk with others every day - practice relaxation or meditation daily - keep a calendar with appointment dates  Follow Up Plan: LCSW will call client or her son on 12/26/20       [name].Tracey Hermance MSW, LCSW Licensed Clinical Social Worker [facility] Care Management [phone]

## 2020-11-21 NOTE — Chronic Care Management (AMB) (Signed)
Chronic Care Management    Clinical Social Work Note  11/21/2020 Name: [name] Williams MRN: [id number] DOB: 1928-01-03  [name] Williams is a 87 y.o. year old female who is a primary care patient of Sharion Balloon, FNP. The CCM team was consulted to assist the patient with chronic disease management and/or care coordination needs related to: [employer] .   Engaged with patient/[name] Williams, son of patient, by telephone for follow up visit in response to provider referral for social work chronic care management and care coordination services.   Consent to Services:  The patient was given information about Chronic Care Management services, agreed to services, and gave verbal consent prior to initiation of services.  Please see initial visit note for detailed documentation.   Patient agreed to services and consent obtained.   Assessment: Review of patient past medical history, allergies, medications, and health status, including review of relevant consultants reports was performed today as part of a comprehensive evaluation and provision of chronic care management and care coordination services.     SDOH (Social Determinants of Health) assessments and interventions performed:    Advanced Directives Status: See Vynca application for related entries.  CCM Care Plan  Allergies  Allergen Reactions  . Latex     Per pt 11-10-14 she do not remember being allergic to this.../or    Outpatient Encounter Medications as of 11/21/2020  Medication Sig Note  . aspirin 81 MG EC tablet Take 81 mg by mouth every other day.  10/06/2014: Received from: [place]  . busPIRone (BUSPAR) 5 MG tablet Take 1 tablet (5 mg total) by mouth 2 (two) times daily.   . busPIRone (BUSPAR) 7.5 MG tablet Take 7.5 mg by mouth 2 (two) times daily.   . ergocalciferol (VITAMIN D2) 1.25 MG (50000 UT) capsule Take 1 capsule (50,000 Units total) by mouth once a week.   . venlafaxine XR (EFFEXOR-XR) 37.5 MG 24 hr capsule Take  1 capsule (37.5 mg total) by mouth daily with breakfast.    No facility-administered encounter medications on file as of 11/21/2020.    Patient Active Problem List   Diagnosis Date Noted  . CLL (chronic lymphocytic leukemia) ([place]) 04/07/2019  . Leukocytosis 03/12/2019  . Insomnia 01/07/2015  . Osteoporosis 01/07/2015  . GAD (generalized anxiety disorder) 11/24/2014  . Dermatitis, purulent 04/08/2014  . Memory loss 01/28/2014  . Fatigue 01/07/2014  . Cerebral vascular accident ([place]) 12/03/2013  . BRADYCARDIA 03/15/2010  . SYNCOPE AND COLLAPSE 03/15/2010  . Depression 09/14/2009  . Transient cerebral ischemia 09/14/2009  . GERD 09/14/2009  . Hyperlipidemia 07/14/2008  . HYPERTENSION, BENIGN 07/14/2008  . Hypertrophic obstructive cardiomyopathy(425.11) 07/14/2008    Conditions to be addressed/monitored:Monitor Anxiety or Stress issues of client   Care Plan : LCSW Care Plan  Updates made by Katha Cabal, LCSW since 11/21/2020 12:00 AM    Problem: Anxiety Identification (Anxiety)     Goal: Manage Anxiety and Depression issues faced   Start Date: 11/21/2020  Expected End Date: 02/19/2021  This Visit's Progress: On track  Priority: Medium  Note:   Current Barriers:  . Chronic Mental Health needs related to anxiety and stress management . Decreased appetite . Suicidal Ideation/Homicidal Ideation: No  Clinical Social Work Goal(s):  . patient will work with SW monthly by telephone or in person to reduce or manage symptoms related to anxiety and stress issues faced . patient will work with SW in next 30 days to address concerns related to mobilty challenges  Interventions: . 1:1 collaboration with Sharion Balloon, FNP regarding development and update of comprehensive plan of care as evidenced by provider attestation and co-signature . Taked with Elson Clan, son of client,about client needs . Talked with [name] Williams about mobility of client . Talked with [name] Williams about appetite of  client . Talked with [name] Williams about pain issues of client . Talked with [name] Williams about upcoming client appointments . Talked with [name] Williams about [name] Williams, who Williams in basement of client's home . Talked with [name] Williams about mood of client [name] Williams said client rests in bed often and does not sleep well, does not eat well) . Talked with [name] Williams about medication procurement of client . Talked with [name] Williams about ADLs completion of client . Encouraged [name] Williams to call [facility] as needed for nursing support  Patient Self Care Activities:  . Attends all scheduled provider appointments  Patient Coping Strengths:  . Family is supportive . Eats meals with set up assistance . Allows time to rest and relax  Patient Self Care Deficits:  . Decreased appetite  Patient Goals:  - spend time or talk with others every day - practice relaxation or meditation daily - keep a calendar with appointment dates  Follow Up Plan: LCSW will call client or her son on 12/26/20     [name] Riffle.Deem Marmol MSW, LCSW Licensed Clinical Social Worker [facility] Care Management [phone]

## 2020-12-03 DIAGNOSIS — Z809 Family history of malignant neoplasm, unspecified: Secondary | ICD-10-CM | POA: Diagnosis not present

## 2020-12-03 DIAGNOSIS — Z720 Tobacco use: Secondary | ICD-10-CM | POA: Diagnosis not present

## 2020-12-03 DIAGNOSIS — R69 Illness, unspecified: Secondary | ICD-10-CM | POA: Diagnosis not present

## 2020-12-10 DIAGNOSIS — S20461A Insect bite (nonvenomous) of right back wall of thorax, initial encounter: Secondary | ICD-10-CM | POA: Diagnosis not present

## 2020-12-10 DIAGNOSIS — W57XXXA Bitten or stung by nonvenomous insect and other nonvenomous arthropods, initial encounter: Secondary | ICD-10-CM | POA: Diagnosis not present

## 2020-12-12 ENCOUNTER — Encounter: Payer: Self-pay | Admitting: Family

## 2020-12-12 ENCOUNTER — Other Ambulatory Visit: Payer: Self-pay

## 2020-12-12 ENCOUNTER — Ambulatory Visit (INDEPENDENT_AMBULATORY_CARE_PROVIDER_SITE_OTHER): Payer: Medicare HMO | Admitting: Family

## 2020-12-12 VITALS — BP 116/75 | HR 62 | Temp 97.4°F | Ht 64.0 in | Wt 122.6 lb

## 2020-12-12 DIAGNOSIS — E785 Hyperlipidemia, unspecified: Secondary | ICD-10-CM | POA: Diagnosis not present

## 2020-12-12 DIAGNOSIS — W57XXXD Bitten or stung by nonvenomous insect and other nonvenomous arthropods, subsequent encounter: Secondary | ICD-10-CM | POA: Diagnosis not present

## 2020-12-12 DIAGNOSIS — I10 Essential (primary) hypertension: Secondary | ICD-10-CM

## 2020-12-12 DIAGNOSIS — G4700 Insomnia, unspecified: Secondary | ICD-10-CM

## 2020-12-12 DIAGNOSIS — K219 Gastro-esophageal reflux disease without esophagitis: Secondary | ICD-10-CM

## 2020-12-12 DIAGNOSIS — F411 Generalized anxiety disorder: Secondary | ICD-10-CM

## 2020-12-12 DIAGNOSIS — F3342 Major depressive disorder, recurrent, in full remission: Secondary | ICD-10-CM | POA: Diagnosis not present

## 2020-12-12 DIAGNOSIS — R69 Illness, unspecified: Secondary | ICD-10-CM | POA: Diagnosis not present

## 2020-12-12 DIAGNOSIS — C9110 Chronic lymphocytic leukemia of B-cell type not having achieved remission: Secondary | ICD-10-CM

## 2020-12-12 MED ORDER — BUSPIRONE HCL 7.5 MG PO TABS: 7.5000 mg | ORAL_TABLET | Freq: Two times a day (BID) | ORAL | 2 refills | Status: DC

## 2020-12-12 MED ORDER — VENLAFAXINE HCL ER 75 MG PO CP24
75.0000 mg | ORAL_CAPSULE | Freq: Every day | ORAL | 2 refills | Status: DC
Start: 2020-12-12 — End: 2021-09-15

## 2020-12-12 NOTE — Patient Instructions (Signed)
http://NIMH.NIH.Gov">  Generalized Anxiety Disorder, Adult Generalized anxiety disorder (GAD) is a mental health condition. Unlike normal worries, anxiety related to GAD is not triggered by a specific event. These worries do not fade or get better with time. GAD interferes with relationships, work, and school. GAD symptoms can vary from mild to severe. People with severe GAD can have intense waves of anxiety with physical symptoms that are similar to panic attacks. What are the causes? The exact cause of GAD is not known, but the following are believed to have an impact:  Differences in natural brain chemicals.  Genes passed down from parents to children.  Differences in the way threats are perceived.  Development during childhood.  Personality. What increases the risk? The following factors may make you more likely to develop this condition:  Being female.  Having a family history of anxiety disorders.  Being very shy.  Experiencing very stressful life events, such as the death of a loved one.  Having a very stressful family environment. What are the signs or symptoms? People with GAD often worry excessively about many things in their lives, such as their health and family. Symptoms may also include:  Mental and emotional symptoms: ? Worrying excessively about natural disasters. ? Fear of being late. ? Difficulty concentrating. ? Fears that others are judging your performance.  Physical symptoms: ? Fatigue. ? Headaches, muscle tension, muscle twitches, trembling, or feeling shaky. ? Feeling like your heart is pounding or beating very fast. ? Feeling out of breath or like you cannot take a deep breath. ? Having trouble falling asleep or staying asleep, or experiencing restlessness. ? Sweating. ? Nausea, diarrhea, or irritable bowel syndrome (IBS).  Behavioral symptoms: ? Experiencing erratic moods or irritability. ? Avoidance of new situations. ? Avoidance of  people. ? Extreme difficulty making decisions. How is this diagnosed? This condition is diagnosed based on your symptoms and medical history. You will also have a physical exam. Your health care provider may perform tests to rule out other possible causes of your symptoms. To be diagnosed with GAD, a person must have anxiety that:  Is out of his or her control.  Affects several different aspects of his or her life, such as work and relationships.  Causes distress that makes him or her unable to take part in normal activities.  Includes at least three symptoms of GAD, such as restlessness, fatigue, trouble concentrating, irritability, muscle tension, or sleep problems. Before your health care provider can confirm a diagnosis of GAD, these symptoms must be present more days than they are not, and they must last for 6 months or longer. How is this treated? This condition may be treated with:  Medicine. Antidepressant medicine is usually prescribed for long-term daily control. Anti-anxiety medicines may be added in severe cases, especially when panic attacks occur.  Talk therapy (psychotherapy). Certain types of talk therapy can be helpful in treating GAD by providing support, education, and guidance. Options include: ? Cognitive behavioral therapy (CBT). People learn coping skills and self-calming techniques to ease their physical symptoms. They learn to identify unrealistic thoughts and behaviors and to replace them with more appropriate thoughts and behaviors. ? Acceptance and commitment therapy (ACT). This treatment teaches people how to be mindful as a way to cope with unwanted thoughts and feelings. ? Biofeedback. This process trains you to manage your body's response (physiological response) through breathing techniques and relaxation methods. You will work with a therapist while machines are used to monitor your physical   symptoms.  Stress management techniques. These include yoga,  meditation, and exercise. A mental health specialist can help determine which treatment is best for you. Some people see improvement with one type of therapy. However, other people require a combination of therapies.   Follow these instructions at home: Lifestyle  Maintain a consistent routine and schedule.  Anticipate stressful situations. Create a plan, and allow extra time to work with your plan.  Practice stress management or self-calming techniques that you have learned from your therapist or your health care provider. General instructions  Take over-the-counter and prescription medicines only as told by your health care provider.  Understand that you are likely to have setbacks. Accept this and be kind to yourself as you persist to take better care of yourself.  Recognize and accept your accomplishments, even if you judge them as small.  Keep all follow-up visits as told by your health care provider. This is important. Contact a health care provider if:  Your symptoms do not get better.  Your symptoms get worse.  You have signs of depression, such as: ? A persistently sad or irritable mood. ? Loss of enjoyment in activities that used to bring you joy. ? Change in weight or eating. ? Changes in sleeping habits. ? Avoiding friends or family members. ? Loss of energy for normal tasks. ? Feelings of guilt or worthlessness. Get help right away if:  You have serious thoughts about hurting yourself or others. If you ever feel like you may hurt yourself or others, or have thoughts about taking your own life, get help right away. Go to your nearest emergency department or:  Call your local emergency services (911 in the [place]  Call a suicide crisis helpline, such as the National Suicide Prevention Lifeline at 1-800-273-8255. This is open 24 hours a day in the [place]  Text the Crisis Text Line at 741741 (in the [place] Summary  Generalized anxiety disorder (GAD) is a mental  health condition that involves worry that is not triggered by a specific event.  People with GAD often worry excessively about many things in their lives, such as their health and family.  GAD may cause symptoms such as restlessness, trouble concentrating, sleep problems, frequent sweating, nausea, diarrhea, headaches, and trembling or muscle twitching.  A mental health specialist can help determine which treatment is best for you. Some people see improvement with one type of therapy. However, other people require a combination of therapies. This information is not intended to replace advice given to you by your health care provider. Make sure you discuss any questions you have with your health care provider. Document Revised: 05/27/2019 Document Reviewed: 05/27/2019 Elsevier Patient Education  2021 [employer].  

## 2020-12-12 NOTE — Progress Notes (Signed)
Subjective:    Patient ID: [name] Williams, female    DOB: August 28, 1927, 87 y.o.   MRN: [id number]  Chief Complaint  Patient presents with  . Medical Management of Chronic Issues  . Insect Bite    On doxy tick bite   . Insomnia   Pt presents to the office today for chronic follow up.   She states her anxiety and depression has worsen. Her son lives in her basement and he is alcoholic. They are currently working on getting him into a nursing home. She reports this is a great deal of stress on her.    She has CLL and is followed by Oncologists.   She reports she had a tick bite on her back and removed 12/10/20 and was started on doxycyline.  Insomnia Primary symptoms: difficulty falling asleep, malaise/fatigue.  The current episode started more than one year. The onset quality is gradual. The problem occurs intermittently. The problem has been waxing and waning since onset. PMH includes: depression.  Anxiety Presents for follow-up visit. Symptoms include depressed mood, insomnia, irritability, nervous/anxious behavior and restlessness. Symptoms occur most days. The severity of symptoms is moderate. The quality of sleep is good.    Depression        This is a chronic problem.  The current episode started more than 1 year ago.   The onset quality is gradual.   The problem occurs intermittently.  Associated symptoms include insomnia, irritable and restlessness.  Past treatments include SNRIs - Serotonin and norepinephrine reuptake inhibitors.  Past medical history includes anxiety.       Review of Systems  Constitutional: Positive for irritability and malaise/fatigue.  Psychiatric/Behavioral: Positive for depression. The patient is nervous/anxious and has insomnia.   All other systems reviewed and are negative.      Objective:   Physical Exam Vitals reviewed.  Constitutional:      General: She is irritable. She is not in acute distress.    Appearance: She is well-developed.   HENT:     Head: Normocephalic and atraumatic.     Right Ear: Tympanic membrane normal.     Left Ear: Tympanic membrane normal.  Eyes:     Pupils: Pupils are equal, round, and reactive to light.  Neck:     Thyroid: No thyromegaly.  Cardiovascular:     Rate and Rhythm: Normal rate and regular rhythm.     Heart sounds: Normal heart sounds. No murmur heard.   Pulmonary:     Effort: Pulmonary effort is normal. No respiratory distress.     Breath sounds: Normal breath sounds. No wheezing.  Abdominal:     General: Bowel sounds are normal. There is no distension.     Palpations: Abdomen is soft.     Tenderness: There is no abdominal tenderness.  Musculoskeletal:        General: No tenderness. Normal range of motion.     Cervical back: Normal range of motion and neck supple.  Skin:    General: Skin is warm and dry.     Findings: Bruising present.  Neurological:     Mental Status: She is alert and oriented to person, place, and time.     Cranial Nerves: No cranial nerve deficit.     Deep Tendon Reflexes: Reflexes are normal and symmetric.  Psychiatric:        Behavior: Behavior normal.        Thought Content: Thought content normal.        Judgment:  Judgment normal.     BP 116/75   Pulse 62   Temp (!) 97.4 F (36.3 C) (Temporal)   Ht 5\' 4"  (1.626 m)   Wt 122 lb 9.6 oz (55.6 kg)   BMI 21.04 kg/m        Assessment & Plan:  [name] Williams comes in today with chief complaint of Medical Management of Chronic Issues, Insect Bite (On doxy tick bite ), and Insomnia   Diagnosis and orders addressed:  1. HYPERTENSION, BENIGN  2. Gastroesophageal reflux disease, unspecified whether esophagitis present  3. CLL (chronic lymphocytic leukemia) ([place])  4. Recurrent major depressive disorder, in full remission (HCC) - venlafaxine XR (EFFEXOR XR) 75 MG 24 hr capsule; Take 1 capsule (75 mg total) by mouth daily with breakfast.  Dispense: 90 capsule; Refill: 2  5. GAD (generalized  anxiety disorder) - venlafaxine XR (EFFEXOR XR) 75 MG 24 hr capsule; Take 1 capsule (75 mg total) by mouth daily with breakfast.  Dispense: 90 capsule; Refill: 2  6. Hyperlipidemia, unspecified hyperlipidemia type   7. Insomnia, unspecified type  8. Tick bite, unspecified site, subsequent encounter Continue Doxycyline  -Pt to report any new fever, joint pain, or rash -Wear protective clothing while outside- Long sleeves and long pants -Put insect repellent on all exposed skin and along clothing -Take a shower as soon as possible after being outside     Labs reviewed from Hematologists  Will increase Effexor to 75 mg from 37.5 mg Stress management  Health Maintenance reviewed Diet and exercise encouraged  Follow up plan: 3 months    Evelina Dun, FNP

## 2020-12-20 ENCOUNTER — Ambulatory Visit (INDEPENDENT_AMBULATORY_CARE_PROVIDER_SITE_OTHER): Payer: Medicare HMO | Admitting: Family

## 2020-12-20 ENCOUNTER — Encounter: Payer: Self-pay | Admitting: Family

## 2020-12-20 VITALS — BP 117/77 | HR 86 | Temp 97.3°F | Resp 20 | Ht 64.0 in | Wt 117.5 lb

## 2020-12-20 DIAGNOSIS — R42 Dizziness and giddiness: Secondary | ICD-10-CM

## 2020-12-20 MED ORDER — MECLIZINE HCL 50 MG PO TABS: 25.0000 mg | ORAL_TABLET | Freq: Three times a day (TID) | ORAL | 0 refills | Status: DC | PRN

## 2020-12-20 NOTE — Progress Notes (Signed)
   Subjective:    Patient ID: Holly Bodily, female    DOB: June 02, 1928, 87 y.o.   MRN: [id number]  Chief Complaint  Patient presents with  . Dizziness   Pt presents to the office today with dizziness that started last Wednesday.  Dizziness This is a new problem. The current episode started in the past 7 days. The problem occurs intermittently. Associated symptoms include fatigue and vertigo. Pertinent negatives include no chest pain, chills, congestion, coughing, nausea, urinary symptoms or visual change. The symptoms are aggravated by standing. She has tried lying down and rest for the symptoms. The treatment provided mild relief.      Review of Systems  Constitutional: Positive for fatigue. Negative for chills.  HENT: Negative for congestion.   Respiratory: Negative for cough.   Cardiovascular: Negative for chest pain.  Gastrointestinal: Negative for nausea.  Neurological: Positive for dizziness and vertigo.  All other systems reviewed and are negative.      Objective:   Physical Exam Vitals reviewed.  Constitutional:      General: She is not in acute distress.    Appearance: She is well-developed.     Comments: underweight  HENT:     Head: Normocephalic and atraumatic.     Right Ear: Tympanic membrane is bulging.     Left Ear: Tympanic membrane is bulging.  Eyes:     Pupils: Pupils are equal, round, and reactive to light.  Neck:     Thyroid: No thyromegaly.  Cardiovascular:     Rate and Rhythm: Normal rate and regular rhythm.     Heart sounds: Normal heart sounds. No murmur heard.   Pulmonary:     Effort: Pulmonary effort is normal. No respiratory distress.     Breath sounds: Normal breath sounds. No wheezing.  Abdominal:     General: Bowel sounds are normal. There is no distension.     Palpations: Abdomen is soft.     Tenderness: There is no abdominal tenderness.  Musculoskeletal:        General: No tenderness. Normal range of motion.     Cervical back: Normal  range of motion and neck supple.  Skin:    General: Skin is warm and dry.  Neurological:     Mental Status: She is alert and oriented to person, place, and time.     Cranial Nerves: No cranial nerve deficit.     Deep Tendon Reflexes: Reflexes are normal and symmetric.  Psychiatric:        Behavior: Behavior normal.        Thought Content: Thought content normal.        Judgment: Judgment normal.          BP 117/77   Pulse 86   Temp (!) 97.3 F (36.3 C) (Temporal)   Resp 20   Ht 5\' 4"  (1.626 m)   Wt 117 lb 8 oz (53.3 kg)   SpO2 97%   BMI 20.17 kg/m   Assessment & Plan:  [name] Mahar comes in today with chief complaint of Dizziness   Diagnosis and orders addressed:  1. Dizziness Fall precautions discussed Force fluids Antivert as needed RTO if symptoms worse or do not improve  - CBC with Differential/Platelet - meclizine (ANTIVERT) 50 MG tablet; Take 0.5 tablets (25 mg total) by mouth 3 (three) times daily as needed.  Dispense: 30 tablet; Refill: 0  Evelina Dun, FNP

## 2020-12-20 NOTE — Patient Instructions (Signed)
Dizziness Dizziness is a common problem. It is a feeling of unsteadiness or light-headedness. You may feel like you are about to faint. Dizziness can lead to injury if you stumble or fall. Anyone can become dizzy, but dizziness is more common in older adults. This condition can be caused by a number of things, including medicines, dehydration, or illness. Follow these instructions at home: Eating and drinking  Drink enough fluid to keep your urine clear or pale yellow. This helps to keep you from becoming dehydrated. Try to drink more clear fluids, such as water.  Do not drink alcohol.  Limit your caffeine intake if told to do so by your health care provider. Check ingredients and nutrition facts to see if a food or beverage contains caffeine.  Limit your salt (sodium) intake if told to do so by your health care provider. Check ingredients and nutrition facts to see if a food or beverage contains sodium. Activity  Avoid making quick movements. ? Rise slowly from chairs and steady yourself until you feel okay. ? In the morning, first sit up on the side of the bed. When you feel okay, stand slowly while you hold onto something until you know that your balance is fine.  If you need to stand in one place for a long time, move your legs often. Tighten and relax the muscles in your legs while you are standing.  Do not drive or use heavy machinery if you feel dizzy.  Avoid bending down if you feel dizzy. Place items in your home so that they are easy for you to reach without leaning over. Lifestyle  Do not use any products that contain nicotine or tobacco, such as cigarettes and e-cigarettes. If you need help quitting, ask your health care provider.  Try to reduce your stress level by using methods such as yoga or meditation. Talk with your health care provider if you need help to manage your stress. General instructions  Watch your dizziness for any changes.  Take over-the-counter and  prescription medicines only as told by your health care provider. Talk with your health care provider if you think that your dizziness is caused by a medicine that you are taking.  Tell a friend or a family member that you are feeling dizzy. If he or she notices any changes in your behavior, have this person call your health care provider.  Keep all follow-up visits as told by your health care provider. This is important. Contact a health care provider if:  Your dizziness does not go away.  Your dizziness or light-headedness gets worse.  You feel nauseous.  You have reduced hearing.  You have new symptoms.  You are unsteady on your feet or you feel like the room is spinning. Get help right away if:  You vomit or have diarrhea and are unable to eat or drink anything.  You have problems talking, walking, swallowing, or using your arms, hands, or legs.  You feel generally weak.  You are not thinking clearly or you have trouble forming sentences. It may take a friend or family member to notice this.  You have chest pain, abdominal pain, shortness of breath, or sweating.  Your vision changes.  You have any bleeding.  You have a severe headache.  You have neck pain or a stiff neck.  You have a fever. These symptoms may represent a serious problem that is an emergency. Do not wait to see if the symptoms will go away. Get medical help   right away. Call your local emergency services (911 in the [place] Do not drive yourself to the hospital. Summary  Dizziness is a feeling of unsteadiness or light-headedness. This condition can be caused by a number of things, including medicines, dehydration, or illness.  Anyone can become dizzy, but dizziness is more common in older adults.  Drink enough fluid to keep your urine clear or pale yellow. Do not drink alcohol.  Avoid making quick movements if you feel dizzy. Monitor your dizziness for any changes. This information is not intended to  replace advice given to you by your health care provider. Make sure you discuss any questions you have with your health care provider. Document Revised: 08/09/2017 Document Reviewed: 09/08/2016 Elsevier Patient Education  2021 Elsevier Inc.  

## 2020-12-21 LAB — CBC WITH DIFFERENTIAL/PLATELET
Basophils Absolute: 0.1 10*3/uL (ref 0.0–0.2)
Basos: 1 %
EOS (ABSOLUTE): 0.2 10*3/uL (ref 0.0–0.4)
Eos: 1 %
Hematocrit: 48.4 % — ABNORMAL HIGH (ref 34.0–46.6)
Hemoglobin: 15.7 g/dL (ref 11.1–15.9)
Immature Grans (Abs): 0 10*3/uL (ref 0.0–0.1)
Immature Granulocytes: 0 %
Lymphocytes Absolute: 9.7 10*3/uL — ABNORMAL HIGH (ref 0.7–3.1)
Lymphs: 56 %
MCH: 30.3 pg (ref 26.6–33.0)
MCHC: 32.4 g/dL (ref 31.5–35.7)
MCV: 93 fL (ref 79–97)
Monocytes Absolute: 1.3 10*3/uL — ABNORMAL HIGH (ref 0.1–0.9)
Monocytes: 8 %
Neutrophils Absolute: 5.8 10*3/uL (ref 1.4–7.0)
Neutrophils: 34 %
Platelets: 180 10*3/uL (ref 150–450)
RBC: 5.19 x10E6/uL (ref 3.77–5.28)
RDW: 12.1 % (ref 11.7–15.4)
WBC: 17.1 10*3/uL — ABNORMAL HIGH (ref 3.4–10.8)

## 2020-12-22 ENCOUNTER — Telehealth: Payer: Self-pay | Admitting: *Deleted

## 2020-12-22 ENCOUNTER — Ambulatory Visit: Payer: Medicare HMO | Admitting: Family

## 2020-12-22 NOTE — Telephone Encounter (Signed)
KeyShea Evans - PA Case ID: [id number] - Rx #: [id number] Status Sent to [facility] Meclizine HCl 25MG  tablets  PA in process

## 2020-12-22 NOTE — Telephone Encounter (Signed)
This approval authorizes your coverage from 09/20/2020 - 08/19/2021, unless we notify you  otherwise, and as long as the following conditions apply: . you remain enrolled in our Medicare Part D prescription drug plan, . your physician or other prescriber continues to prescribe the medication for you, and . the medication continues to be safe for treating your condition.  Pharmacy aware

## 2020-12-26 ENCOUNTER — Telehealth: Payer: Medicare HMO

## 2020-12-28 ENCOUNTER — Other Ambulatory Visit: Payer: Self-pay

## 2020-12-28 ENCOUNTER — Ambulatory Visit (INDEPENDENT_AMBULATORY_CARE_PROVIDER_SITE_OTHER): Payer: Medicare HMO | Admitting: Family Medicine

## 2020-12-28 ENCOUNTER — Ambulatory Visit: Payer: Medicare HMO | Admitting: Family Medicine

## 2020-12-28 ENCOUNTER — Encounter: Payer: Self-pay | Admitting: Family Medicine

## 2020-12-28 VITALS — BP 120/76 | HR 57 | Temp 97.5°F | Ht 64.0 in | Wt 119.4 lb

## 2020-12-28 DIAGNOSIS — D72820 Lymphocytosis (symptomatic): Secondary | ICD-10-CM

## 2020-12-28 DIAGNOSIS — C9110 Chronic lymphocytic leukemia of B-cell type not having achieved remission: Secondary | ICD-10-CM

## 2020-12-28 DIAGNOSIS — R531 Weakness: Secondary | ICD-10-CM

## 2020-12-28 NOTE — Progress Notes (Signed)
Subjective: CC: Generalized weakness PCP: Sharion Balloon, FNP [name] Williams is a 87 y.o. female presenting to clinic today for:  1. Generalized weakness Patient here for generalized weakness.  She was seen by her PCP last week for reports of dizziness.  Per last oncology note in 10/2020, she has had several pound weight loss over the last several months.  She has known CLL and is followed very closely by Dr. Delton Coombes.  White blood cell count was elevated on labs last week and this was thought to be in the setting of ongoing CLL.  [name] Williams was encouraged to follow-up with her oncologist.  However no apparent appointment scheduled till September.  Admits that she feels somewhat weaker than her normal but overall seems to be improving.  She had a diarrheal illness which resulted in watery diarrhea and vomiting.  No hematochezia or hematemesis.  Symptoms were only present on Friday and have since resolved.  Bowel movements are back to her baseline.  She is trying to hydrate but admits that she does not really enjoy water.  No falls.  She does not feel that she needs physical therapy.  Denies any dizziness today.  She wanted to get her labs rechecked since her white blood cell count had gone up a little bit.   ROS: Per HPI  Allergies  Allergen Reactions  . Latex     Per pt 11-10-14 she do not remember being allergic to this.../or   Past Medical History:  Diagnosis Date  . Anxiety   . Depressive disorder, not elsewhere classified   . Esophageal reflux   . Essential hypertension, benign   . Hypertr obst cardiomyop   . Other and unspecified hyperlipidemia   . Stroke ([place])   . Unspecified transient cerebral ischemia     Current Outpatient Medications:  .  busPIRone (BUSPAR) 7.5 MG tablet, Take 1 tablet (7.5 mg total) by mouth 2 (two) times daily., Disp: 60 tablet, Rfl: 2 .  ergocalciferol (VITAMIN D2) 1.25 MG (50000 UT) capsule, Take 1 capsule (50,000 Units total) by mouth once a  week., Disp: 12 capsule, Rfl: 2 .  meclizine (ANTIVERT) 50 MG tablet, Take 0.5 tablets (25 mg total) by mouth 3 (three) times daily as needed., Disp: 30 tablet, Rfl: 0 .  venlafaxine XR (EFFEXOR XR) 75 MG 24 hr capsule, Take 1 capsule (75 mg total) by mouth daily with breakfast., Disp: 90 capsule, Rfl: 2 Social History   Socioeconomic History  . Marital status: Widowed    Spouse name: Not on file  . Number of children: 3  . Years of education: Not on file  . Highest education level: 9th grade  Occupational History  . Not on file  Tobacco Use  . Smoking status: Former Research scientist (life sciences)  . Smokeless tobacco: Never Used  . Tobacco comment: quit 2012  Vaping Use  . Vaping Use: Never used  Substance and Sexual Activity  . Alcohol use: No  . Drug use: No  . Sexual activity: Not Currently  Other Topics Concern  . Not on file  Social History Narrative   Patient is retired.    Patient is widowed.    Patient has 3 children.    Patient is right handed.    Pt lives with son.     Social Determinants of Health   Financial Resource Strain: Not on file  Food Insecurity: Not on file  Transportation Needs: No Transportation Needs  . Lack of Transportation (Medical): No  .  Lack of Transportation (Non-Medical): No  Physical Activity: Not on file  Stress: Stress Concern Present  . Feeling of Stress : Very much  Social Connections: Not on file  Intimate Partner Violence: Not on file   Family History  Problem Relation Age of Onset  . Breast cancer Mother   . Stomach cancer Sister   . Uterine cancer Sister   . Depression Sister   . Cancer Brother   . Alcohol abuse Son     Objective: Office vital signs reviewed. BP 120/76   Pulse (!) 57   Temp (!) 97.5 F (36.4 C)   Ht 5\' 4"  (1.626 m)   Wt 119 lb 6.4 oz (54.2 kg)   SpO2 96%   BMI 20.49 kg/m   Physical Examination:  General: Awake, alert, thin elderly female, No acute distress HEENT: Normal; sclera white.    Cardio: regular rate and  rhythm, S1S2 heard, no murmurs appreciated Pulm: clear to auscultation bilaterally, no wheezes, rhonchi or rales; normal work of breathing on room air Extremities: cool, well perfused, No edema, cyanosis or clubbing; +2 pulses bilaterally.  Reduced skin turgor noted MSK: Ambulating independently.  Assessment/ Plan: 87 y.o. female   Weakness - Plan: Urinalysis, Routine w reflex microscopic, Basic Metabolic Panel, CBC with Differential  Lymphocytosis - Plan: Urinalysis, Routine w reflex microscopic, CBC with Differential  CLL (chronic lymphocytic leukemia) (HCC) - Plan: CBC with Differential  Weakness seems to be resolving.  She declined physical therapy.  I obtained a urinalysis just to be sure that we were not missing an undiagnosed urinary tract infection since she had a slight increase in her white blood cell count on recent labs.  She is able to ambulate independently on exam.  She was admittedly somewhat dry appearing and I have reiterated need for oral hydration.  I have ordered repeat CBC and metabolic panel given known renal impairment in the past with recent diarrheal illness.  I will CC PCP and oncologist as Juluis Rainier.  She will follow-up as needed or as scheduled  No orders of the defined types were placed in this encounter.  No orders of the defined types were placed in this encounter.    Janora Norlander, DO [place] [phone]

## 2020-12-28 NOTE — Patient Instructions (Signed)
No evidence of urinary tract infection  We rechecked your white blood cell labs and a kidney function test since you had all that diarrhea last week.  Labs should be back in a few days.  We will call you.

## 2020-12-29 LAB — MICROSCOPIC EXAMINATION: Bacteria, UA: NONE SEEN

## 2020-12-29 LAB — CBC WITH DIFFERENTIAL/PLATELET
Basophils Absolute: 0.1 10*3/uL (ref 0.0–0.2)
Basos: 1 %
EOS (ABSOLUTE): 0.1 10*3/uL (ref 0.0–0.4)
Eos: 1 %
Hematocrit: 40.1 % (ref 34.0–46.6)
Hemoglobin: 13.7 g/dL (ref 11.1–15.9)
Immature Grans (Abs): 0 10*3/uL (ref 0.0–0.1)
Immature Granulocytes: 0 %
Lymphocytes Absolute: 10.2 10*3/uL — ABNORMAL HIGH (ref 0.7–3.1)
Lymphs: 64 %
MCH: 31.9 pg (ref 26.6–33.0)
MCHC: 34.2 g/dL (ref 31.5–35.7)
MCV: 94 fL (ref 79–97)
Monocytes Absolute: 1.1 10*3/uL — ABNORMAL HIGH (ref 0.1–0.9)
Monocytes: 7 %
Neutrophils Absolute: 4.4 10*3/uL (ref 1.4–7.0)
Neutrophils: 27 %
Platelets: 174 10*3/uL (ref 150–450)
RBC: 4.29 x10E6/uL (ref 3.77–5.28)
RDW: 12.1 % (ref 11.7–15.4)
WBC: 15.9 10*3/uL — ABNORMAL HIGH (ref 3.4–10.8)

## 2020-12-29 LAB — URINALYSIS, ROUTINE W REFLEX MICROSCOPIC
Bilirubin, UA: NEGATIVE
Glucose, UA: NEGATIVE
Ketones, UA: NEGATIVE
Nitrite, UA: NEGATIVE
Protein,UA: NEGATIVE
Specific Gravity, UA: 1.025 (ref 1.005–1.030)
Urobilinogen, Ur: 0.2 mg/dL (ref 0.2–1.0)
pH, UA: 5.5 (ref 5.0–7.5)

## 2020-12-29 LAB — BASIC METABOLIC PANEL
BUN/Creatinine Ratio: 14 (ref 12–28)
BUN: 15 mg/dL (ref 10–36)
CO2: 29 mmol/L (ref 20–29)
Calcium: 8.9 mg/dL (ref 8.7–10.3)
Chloride: 101 mmol/L (ref 96–106)
Creatinine, Ser: 1.11 mg/dL — ABNORMAL HIGH (ref 0.57–1.00)
Glucose: 104 mg/dL — ABNORMAL HIGH (ref 65–99)
Potassium: 4.8 mmol/L (ref 3.5–5.2)
Sodium: 141 mmol/L (ref 134–144)
eGFR: 47 mL/min/{1.73_m2} — ABNORMAL LOW (ref >59)

## 2021-01-11 ENCOUNTER — Other Ambulatory Visit: Payer: Self-pay | Admitting: Family

## 2021-02-02 ENCOUNTER — Telehealth: Payer: Medicare HMO

## 2021-02-22 ENCOUNTER — Telehealth: Payer: Medicare Other

## 2021-03-07 ENCOUNTER — Telehealth: Payer: Medicare Other

## 2021-03-13 ENCOUNTER — Other Ambulatory Visit: Payer: Self-pay

## 2021-03-13 ENCOUNTER — Encounter: Payer: Self-pay | Admitting: Family

## 2021-03-13 ENCOUNTER — Ambulatory Visit (INDEPENDENT_AMBULATORY_CARE_PROVIDER_SITE_OTHER): Payer: Medicare Other | Admitting: Family

## 2021-03-13 ENCOUNTER — Ambulatory Visit (INDEPENDENT_AMBULATORY_CARE_PROVIDER_SITE_OTHER): Payer: Medicare Other

## 2021-03-13 VITALS — BP 119/78 | HR 74 | Temp 97.5°F | Ht 64.0 in | Wt 125.0 lb

## 2021-03-13 DIAGNOSIS — Z780 Asymptomatic menopausal state: Secondary | ICD-10-CM

## 2021-03-13 DIAGNOSIS — G4700 Insomnia, unspecified: Secondary | ICD-10-CM

## 2021-03-13 DIAGNOSIS — F411 Generalized anxiety disorder: Secondary | ICD-10-CM

## 2021-03-13 DIAGNOSIS — E785 Hyperlipidemia, unspecified: Secondary | ICD-10-CM | POA: Diagnosis not present

## 2021-03-13 DIAGNOSIS — C9110 Chronic lymphocytic leukemia of B-cell type not having achieved remission: Secondary | ICD-10-CM | POA: Diagnosis not present

## 2021-03-13 DIAGNOSIS — I10 Essential (primary) hypertension: Secondary | ICD-10-CM | POA: Diagnosis not present

## 2021-03-13 DIAGNOSIS — M85852 Other specified disorders of bone density and structure, left thigh: Secondary | ICD-10-CM | POA: Diagnosis not present

## 2021-03-13 DIAGNOSIS — M85832 Other specified disorders of bone density and structure, left forearm: Secondary | ICD-10-CM | POA: Diagnosis not present

## 2021-03-13 DIAGNOSIS — F3342 Major depressive disorder, recurrent, in full remission: Secondary | ICD-10-CM

## 2021-03-13 DIAGNOSIS — K219 Gastro-esophageal reflux disease without esophagitis: Secondary | ICD-10-CM

## 2021-03-13 NOTE — Progress Notes (Signed)
Subjective:    Patient ID: [name] Williams, female    DOB: 05/17/1928, 87 y.o.   MRN: [id number]  Chief Complaint  Patient presents with   Medical Management of Chronic Issues   Pt presents to the office today for chronic follow up.   She states her anxiety and depression has worsen. Her son lives in her basement and he is alcoholic. They are currently working on getting him into a nursing home. She reports this is a great deal of stress on her.     She has CLL and is followed by Oncologists. Gastroesophageal Reflux She complains of belching and heartburn. This is a chronic problem. The current episode started more than 1 year ago. The problem occurs occasionally. The problem has been resolved. She has tried an antacid for the symptoms. The treatment provided moderate relief.  Insomnia Primary symptoms: difficulty falling asleep, frequent awakening.   The current episode started more than one year. The onset quality is gradual. The problem occurs intermittently. PMH includes: depression.   Hyperlipidemia This is a chronic problem. The current episode started more than 1 year ago. Current antihyperlipidemic treatment includes diet change. The current treatment provides mild improvement of lipids. Risk factors for coronary artery disease include dyslipidemia, hypertension and a sedentary lifestyle.  Anxiety Presents for follow-up visit. Symptoms include depressed mood, excessive worry, insomnia, irritability and nervous/anxious behavior. Symptoms occur occasionally. The severity of symptoms is moderate.    Depression        This is a chronic problem.  The current episode started more than 1 year ago.   Associated symptoms include helplessness, hopelessness, insomnia, irritable, decreased interest and sad.  Past treatments include SNRIs - Serotonin and norepinephrine reuptake inhibitors.  Past medical history includes anxiety.      Review of Systems  Constitutional:  Positive for  irritability.  Gastrointestinal:  Positive for heartburn.  Psychiatric/Behavioral:  Positive for depression. The patient is nervous/anxious and has insomnia.   All other systems reviewed and are negative.     Objective:   Physical Exam Vitals reviewed.  Constitutional:      General: She is irritable. She is not in acute distress.    Appearance: She is well-developed.  HENT:     Head: Normocephalic and atraumatic.     Right Ear: Tympanic membrane normal.     Left Ear: Tympanic membrane normal.  Eyes:     Pupils: Pupils are equal, round, and reactive to light.  Neck:     Thyroid: No thyromegaly.  Cardiovascular:     Rate and Rhythm: Normal rate and regular rhythm.     Heart sounds: Murmur heard.  Pulmonary:     Effort: Pulmonary effort is normal. No respiratory distress.     Breath sounds: Normal breath sounds. No wheezing.  Abdominal:     General: Bowel sounds are normal. There is no distension.     Palpations: Abdomen is soft.     Tenderness: There is no abdominal tenderness.  Musculoskeletal:        General: No tenderness. Normal range of motion.     Cervical back: Normal range of motion and neck supple.  Skin:    General: Skin is warm and dry.  Neurological:     Mental Status: She is alert and oriented to person, place, and time.     Cranial Nerves: No cranial nerve deficit.     Deep Tendon Reflexes: Reflexes are normal and symmetric.  Psychiatric:  Behavior: Behavior normal.        Thought Content: Thought content normal.        Judgment: Judgment normal.         BP 119/78   Pulse 74   Temp (!) 97.5 F (36.4 C) (Temporal)   Ht '5\' 4"'$  (1.626 m)   Wt 125 lb (56.7 kg)   SpO2 95%   BMI 21.46 kg/m   Assessment & Plan:  [name] Williams comes in today with chief complaint of Medical Management of Chronic Issues   Diagnosis and orders addressed:  1. HYPERTENSION, BENIGN  2. Gastroesophageal reflux disease, unspecified whether esophagitis present  3.  CLL (chronic lymphocytic leukemia) ([place])  4. GAD (generalized anxiety disorder)  5. Insomnia, unspecified type  6. Recurrent major depressive disorder, in full remission ([place])  7. Hyperlipidemia, unspecified hyperlipidemia type  8. Post-menopause - DG [facility] DEXA   Labs pending Health Maintenance reviewed Diet and exercise encouraged  Follow up plan: 3 months and keep follow up with Oncologists    Evelina Dun, FNP

## 2021-03-13 NOTE — Patient Instructions (Signed)

## 2021-03-14 ENCOUNTER — Other Ambulatory Visit: Payer: Self-pay | Admitting: Family

## 2021-03-15 NOTE — Progress Notes (Signed)
Pt called and aware

## 2021-03-17 ENCOUNTER — Other Ambulatory Visit: Payer: Self-pay

## 2021-03-17 ENCOUNTER — Ambulatory Visit (INDEPENDENT_AMBULATORY_CARE_PROVIDER_SITE_OTHER): Payer: Medicare Other

## 2021-03-17 ENCOUNTER — Ambulatory Visit (INDEPENDENT_AMBULATORY_CARE_PROVIDER_SITE_OTHER): Payer: Medicare Other | Admitting: Family Medicine

## 2021-03-17 VITALS — BP 116/73 | HR 76 | Ht 64.0 in | Wt 127.0 lb

## 2021-03-17 DIAGNOSIS — R1032 Left lower quadrant pain: Secondary | ICD-10-CM

## 2021-03-17 MED ORDER — AMOXICILLIN-POT CLAVULANATE 875-125 MG PO TABS: 1.0000 | ORAL_TABLET | Freq: Two times a day (BID) | ORAL | 0 refills | Status: AC

## 2021-03-17 NOTE — Progress Notes (Signed)
 Acute Office Visit  Subjective:    Patient ID: [name] Williams, female    DOB: 07/06/1928, 87 y.o.   MRN: [id number]  Chief Complaint  Patient presents with   Abdominal Pain    LLQ- worse with walking    HPI Patient is in today for LLQ x 10 days. The pain is intermittent. It is mild to moderate. Reports a soreness when she is walking. The pain has been about the same. Denies cramping, fever, diarrhea, nausea, vomiting, chills, or changes in appetite. She reports a daily DM. Denies urinary symptoms. Denies blood in stool or urine. She has a history of diverticulosis. She reports decreased pain response and is worried about her symptoms. She   Past Medical History:  Diagnosis Date   Anxiety    Depressive disorder, not elsewhere classified    Esophageal reflux    Essential hypertension, benign    Hypertr obst cardiomyop    Other and unspecified hyperlipidemia    Stroke (HCC)    Unspecified transient cerebral ischemia     Past Surgical History:  Procedure Laterality Date   BLADDER REPAIR     CERVIX SURGERY     CHOLECYSTECTOMY     TONSILLECTOMY     TUBAL LIGATION      Family History  Problem Relation Age of Onset   Breast cancer Mother    Stomach cancer Sister    Uterine cancer Sister    Depression Sister    Cancer Brother    Alcohol abuse Son     Social History   Socioeconomic History   Marital status: Widowed    Spouse name: Not on file   Number of children: 3   Years of education: Not on file   Highest education level: 9th grade  Occupational History   Not on file  Tobacco Use   Smoking status: Former   Smokeless tobacco: Never   Tobacco comments:    quit 2012  Vaping Use   Vaping Use: Never used  Substance and Sexual Activity   Alcohol use: No   Drug use: No   Sexual activity: Not Currently  Other Topics Concern   Not on file  Social History Narrative   Patient is retired.    Patient is widowed.    Patient has 3 children.    Patient is right  handed.    Pt lives with son.     Social Determinants of Health   Financial Resource Strain: Not on file  Food Insecurity: Not on file  Transportation Needs: No Transportation Needs   Lack of Transportation (Medical): No   Lack of Transportation (Non-Medical): No  Physical Activity: Not on file  Stress: Stress Concern Present   Feeling of Stress : Very much  Social Connections: Not on file  Intimate Partner Violence: Not on file    Outpatient Medications Prior to Visit  Medication Sig Dispense Refill   busPIRone (BUSPAR) 7.5 MG tablet TAKE 1 TABLET BY MOUTH 2 TIMES DAILY. 180 tablet 0   ergocalciferol (VITAMIN D2) 1.25 MG (50000 UT) capsule Take 1 capsule (50,000 Units total) by mouth once a week. 12 capsule 2   meclizine (ANTIVERT) 50 MG tablet Take 0.5 tablets (25 mg total) by mouth 3 (three) times daily as needed. 30 tablet 0   venlafaxine XR (EFFEXOR XR) 75 MG 24 hr capsule Take 1 capsule (75 mg total) by mouth daily with breakfast. 90 capsule 2   No facility-administered medications prior to visit.      Allergies  Allergen Reactions   Latex     Per pt 11-10-14 she do not remember being allergic to this.../or    Review of Systems As per HPI.     Objective:    Physical Exam Vitals and nursing note reviewed.  Constitutional:      General: She is not in acute distress.    Appearance: She is not ill-appearing, toxic-appearing or diaphoretic.  HENT:     Head: Normocephalic and atraumatic.  Cardiovascular:     Rate and Rhythm: Normal rate and regular rhythm.     Heart sounds: Normal heart sounds. No murmur heard. Pulmonary:     Effort: Pulmonary effort is normal. No respiratory distress.     Breath sounds: Normal breath sounds.  Abdominal:     General: Bowel sounds are normal. There is no distension.     Palpations: Abdomen is soft.     Tenderness: There is abdominal tenderness in the left lower quadrant. There is no right CVA tenderness, left CVA tenderness, guarding  or rebound. Negative signs include Murphy's sign, Rovsing's sign and McBurney's sign.     Hernia: No hernia is present.  Skin:    General: Skin is warm and dry.  Neurological:     Mental Status: She is alert and oriented to person, place, and time.  Psychiatric:        Mood and Affect: Mood normal.        Behavior: Behavior normal.    BP 116/73   Pulse 76   Ht 5' 4" (1.626 m)   Wt 127 lb (57.6 kg)   SpO2 97%   BMI 21.80 kg/m  Wt Readings from Last 3 Encounters:  03/17/21 127 lb (57.6 kg)  03/13/21 125 lb (56.7 kg)  12/28/20 119 lb 6.4 oz (54.2 kg)    There are no preventive care reminders to display for this patient.  There are no preventive care reminders to display for this patient.   Lab Results  Component Value Date   TSH 3.690 09/12/2020   Lab Results  Component Value Date   WBC 15.9 (H) 12/28/2020   HGB 13.7 12/28/2020   HCT 40.1 12/28/2020   MCV 94 12/28/2020   PLT 174 12/28/2020   Lab Results  Component Value Date   NA 141 12/28/2020   K 4.8 12/28/2020   CO2 29 12/28/2020   GLUCOSE 104 (H) 12/28/2020   BUN 15 12/28/2020   CREATININE 1.11 (H) 12/28/2020   BILITOT 1.1 11/03/2020   ALKPHOS 72 11/03/2020   AST 19 11/03/2020   ALT 12 11/03/2020   PROT 6.9 11/03/2020   ALBUMIN 3.8 11/03/2020   CALCIUM 8.9 12/28/2020   ANIONGAP 8 11/03/2020   EGFR 47 (L) 12/28/2020   Lab Results  Component Value Date   CHOL 166 04/01/2017   Lab Results  Component Value Date   HDL 34 (L) 04/01/2017   Lab Results  Component Value Date   LDLCALC 79 04/01/2017   Lab Results  Component Value Date   TRIG 265 (H) 04/01/2017   Lab Results  Component Value Date   CHOLHDL 4.9 (H) 04/01/2017   Lab Results  Component Value Date   HGBA1C  05/17/2008    6.0 (NOTE)   The ADA recommends the following therapeutic goal for glycemic   control related to Hgb A1C measurement:   Goal of Therapy:   < 7.0% Hgb A1C   Reference: American Diabetes Association: Clinical  Practice   Recommendations 2008, Diabetes Care,    2008, 31:(Suppl 1).       Assessment & Plan:   [name] Williams was seen today for abdominal pain.  Diagnoses and all orders for this visit:  Left lower quadrant abdominal pain KUB today in office- appears unremarkable, radiology report is pending. Will treat with augmentin given hx of diverticulosis with LLQ pain and tenderness.  -     DG Abd 1 View; Future -     amoxicillin-clavulanate (AUGMENTIN) 875-125 MG tablet; Take 1 tablet by mouth 2 (two) times daily for 7 days.  Return to office for new or worsening symptoms, or if symptoms persist.   The patient indicates understanding of these issues and agrees with the plan.   Gwenlyn Perking, FNP

## 2021-03-20 ENCOUNTER — Encounter: Payer: Self-pay | Admitting: Family Medicine

## 2021-04-09 ENCOUNTER — Other Ambulatory Visit: Payer: Self-pay | Admitting: Family

## 2021-04-14 ENCOUNTER — Ambulatory Visit (INDEPENDENT_AMBULATORY_CARE_PROVIDER_SITE_OTHER): Payer: Medicare Other | Admitting: Licensed Clinical Social Worker

## 2021-04-14 DIAGNOSIS — I10 Essential (primary) hypertension: Secondary | ICD-10-CM

## 2021-04-14 DIAGNOSIS — I639 Cerebral infarction, unspecified: Secondary | ICD-10-CM

## 2021-04-14 DIAGNOSIS — F3342 Major depressive disorder, recurrent, in full remission: Secondary | ICD-10-CM

## 2021-04-14 DIAGNOSIS — G4700 Insomnia, unspecified: Secondary | ICD-10-CM

## 2021-04-14 DIAGNOSIS — E785 Hyperlipidemia, unspecified: Secondary | ICD-10-CM | POA: Diagnosis not present

## 2021-04-14 DIAGNOSIS — K219 Gastro-esophageal reflux disease without esophagitis: Secondary | ICD-10-CM

## 2021-04-14 DIAGNOSIS — F411 Generalized anxiety disorder: Secondary | ICD-10-CM

## 2021-04-14 NOTE — Chronic Care Management (AMB) (Signed)
Chronic Care Management    Clinical Social Work Note  04/14/2021 Name: [name] Williams MRN: [id number] DOB: 1928-06-09  [name] Williams is a 87 y.o. year old female who is a primary care patient of Sharion Balloon, FNP. The CCM team was consulted to assist the patient with chronic disease management and/or care coordination needs related to: [employer] .   Engaged with patient/ son of patient, [name] Williams, by telephone for follow up visit in response to provider referral for social work chronic care management and care coordination services.   Consent to Services:  The patient was given information about Chronic Care Management services, agreed to services, and gave verbal consent prior to initiation of services.  Please see initial visit note for detailed documentation.   Patient agreed to services and consent obtained.   Assessment: Review of patient past medical history, allergies, medications, and health status, including review of relevant consultants reports was performed today as part of a comprehensive evaluation and provision of chronic care management and care coordination services.     SDOH (Social Determinants of Health) assessments and interventions performed:  SDOH Interventions    Flowsheet Row Most Recent Value  SDOH Interventions   Stress Interventions Provide Counseling  [client has anxiety and stress over family issues]  Depression Interventions/Treatment  Medication        Advanced Directives Status: See Vynca application for related entries.  CCM Care Plan  Allergies  Allergen Reactions   Latex     Per pt 11-10-14 she do not remember being allergic to this.../or    Outpatient Encounter Medications as of 04/14/2021  Medication Sig   busPIRone (BUSPAR) 7.5 MG tablet TAKE 1 TABLET BY MOUTH TWICE A DAY   ergocalciferol (VITAMIN D2) 1.25 MG (50000 UT) capsule Take 1 capsule (50,000 Units total) by mouth once a week.   meclizine (ANTIVERT) 50 MG tablet Take  0.5 tablets (25 mg total) by mouth 3 (three) times daily as needed.   venlafaxine XR (EFFEXOR XR) 75 MG 24 hr capsule Take 1 capsule (75 mg total) by mouth daily with breakfast.   No facility-administered encounter medications on file as of 04/14/2021.    Patient Active Problem List   Diagnosis Date Noted   CLL (chronic lymphocytic leukemia) ([place]) 04/07/2019   Leukocytosis 03/12/2019   Insomnia 01/07/2015   Osteopenia 01/07/2015   GAD (generalized anxiety disorder) 11/24/2014   Dermatitis, purulent 04/08/2014   Memory loss 01/28/2014   Fatigue 01/07/2014   Cerebral vascular accident ([place]) 12/03/2013   BRADYCARDIA 03/15/2010   SYNCOPE AND COLLAPSE 03/15/2010   Depression 09/14/2009   Transient cerebral ischemia 09/14/2009   GERD 09/14/2009   Hyperlipidemia 07/14/2008   HYPERTENSION, BENIGN 07/14/2008   Hypertrophic obstructive cardiomyopathy(425.11) 07/14/2008   Hypertrophic obstructive cardiomyopathy ([place]) 07/14/2008    Conditions to be addressed/monitored: monitor client management of anxiety and stress issues faced  Care Plan : LCSW Care Plan  Updates made by Katha Cabal, LCSW since 04/14/2021 12:00 AM     Problem: Anxiety Identification (Anxiety)      Goal: Manage Anxiety and Depression issues faced   Start Date: 04/14/2021  Expected End Date: 07/12/2021  This Visit's Progress: On track  Recent Progress: On track  Priority: Medium  Note:   Current Barriers:  Chronic Mental Health needs related to anxiety and stress management Decreased appetite Suicidal Ideation/Homicidal Ideation: No  Clinical Social Work Goal(s):  patient will work with SW monthly by telephone or in person to reduce  or manage symptoms related to anxiety and stress issues faced patient will work with SW in next 30 days to address concerns related to mobilty challenges  Interventions: 1:1 collaboration with Sharion Balloon, FNP regarding development and update of comprehensive plan of  care as evidenced by provider attestation and co-signature Talked with Elson Clan, son of client,about client needs Talked with Zenia Resides about appetite of client Talked with Zenia Resides about sleeping issues of client Talked  with Zenia Resides about Violett Ohayon, who resides in basement of client's home Talked with Zenia Resides about decreased energy of client Talked with Zenia Resides about skin issues of client Talked with client about client needs   Patient Self Care Activities:  Attends all scheduled provider appointments  Patient Coping Strengths:  Family is supportive Eats meals with set up assistance Allows time to rest and relax  Patient Self Care Deficits:  Decreased appetite  Patient Goals:  - spend time or talk with others every day - practice relaxation or meditation daily - keep a calendar with appointment dates  Follow Up Plan: LCSW will call client or her son on 05/24/21 to assess client needs      [name].Aarti Mankowski MSW, LCSW Licensed Clinical Social Worker [facility] Care Management [phone]

## 2021-04-14 NOTE — Patient Instructions (Signed)
Visit Information  PATIENT GOALS:  Goals Addressed               This Visit's Progress     Protect My Health        wants to better manage anxiety and stress issues faced (pt-stated)        Timeframe:  Short-Term Goal Priority:  Medium Progress: On Track Start Date:         04/14/21                  Expected End Date:   07/12/21                   Follow Up Date  05/24/21  Manage My Emotions (Patient). Manage anxiety and stress issues faced    Why is this important?   When you are stressed, down or upset, your body reacts too.  For example, your blood pressure may get higher; you may have a headache or stomachache.  When your emotions get the best of you, your body's ability to fight off cold and flu gets weak.  These steps will help you manage your emotions.     Patient Self Care Activities:  Attends all scheduled provider appointments  Patient Coping Strengths:  Family is supportive Eats meals with set up assistance Allows time to rest and relax  Patient Self Care Deficits:  Decreased appetite  Patient Goals:  - spend time or talk with others every day - practice relaxation or meditation daily - keep a calendar with appointment dates  Follow Up Plan: LCSW will call client or her son on 05/24/21 to assess client needs     [name].Makenzy Krist MSW, LCSW Licensed Clinical Social Worker [facility] Care Management [phone]

## 2021-05-11 ENCOUNTER — Other Ambulatory Visit: Payer: Self-pay

## 2021-05-11 ENCOUNTER — Inpatient Hospital Stay (HOSPITAL_COMMUNITY): Payer: Medicare Other

## 2021-05-11 ENCOUNTER — Encounter: Payer: Self-pay | Admitting: Family

## 2021-05-11 ENCOUNTER — Ambulatory Visit (INDEPENDENT_AMBULATORY_CARE_PROVIDER_SITE_OTHER): Payer: Medicare Other | Admitting: Family

## 2021-05-11 VITALS — BP 95/52 | HR 41 | Ht 63.0 in | Wt 120.2 lb

## 2021-05-11 DIAGNOSIS — S51012A Laceration without foreign body of left elbow, initial encounter: Secondary | ICD-10-CM | POA: Diagnosis not present

## 2021-05-11 DIAGNOSIS — Y92009 Unspecified place in unspecified non-institutional (private) residence as the place of occurrence of the external cause: Secondary | ICD-10-CM

## 2021-05-11 DIAGNOSIS — G4700 Insomnia, unspecified: Secondary | ICD-10-CM

## 2021-05-11 DIAGNOSIS — F411 Generalized anxiety disorder: Secondary | ICD-10-CM

## 2021-05-11 DIAGNOSIS — I959 Hypotension, unspecified: Secondary | ICD-10-CM | POA: Diagnosis not present

## 2021-05-11 DIAGNOSIS — R001 Bradycardia, unspecified: Secondary | ICD-10-CM | POA: Diagnosis not present

## 2021-05-11 DIAGNOSIS — W19XXXA Unspecified fall, initial encounter: Secondary | ICD-10-CM

## 2021-05-11 NOTE — Progress Notes (Signed)
Subjective:    Patient ID: [name] Williams, female    DOB: Sep 28, 1927, 87 y.o.   MRN: [id number]  No chief complaint on file.  Pt presents to the office today with worsening depression and anxiety.   She reports she fell Monday evening in her kitchen. She has a skin tear on her left elbow.  Anxiety Presents for follow-up visit. Symptoms include depressed mood, excessive worry, irritability and restlessness. Symptoms occur most days. The severity of symptoms is moderate and interfering with daily activities.    Depression        This is a chronic problem.  The current episode started more than 1 year ago.   Associated symptoms include helplessness, hopelessness, irritable, restlessness and sad.  Past treatments include SNRIs - Serotonin and norepinephrine reuptake inhibitors.  Past medical history includes anxiety.      Review of Systems  Constitutional:  Positive for irritability.  Skin:  Positive for wound.  Psychiatric/Behavioral:  Positive for depression.   All other systems reviewed and are negative.     Objective:   Physical Exam Vitals reviewed.  Constitutional:      General: She is irritable. She is not in acute distress.    Appearance: She is well-developed.  HENT:     Head: Normocephalic and atraumatic.  Eyes:     Pupils: Pupils are equal, round, and reactive to light.  Neck:     Thyroid: No thyromegaly.  Cardiovascular:     Rate and Rhythm: Regular rhythm. Bradycardia present.     Heart sounds: Normal heart sounds. No murmur heard. Pulmonary:     Effort: Pulmonary effort is normal. No respiratory distress.     Breath sounds: Normal breath sounds. No wheezing.  Abdominal:     General: Bowel sounds are normal. There is no distension.     Palpations: Abdomen is soft.     Tenderness: There is no abdominal tenderness.  Musculoskeletal:        General: No tenderness. Normal range of motion.     Cervical back: Normal range of motion and neck supple.  Skin:     General: Skin is warm and dry.     Findings: Abrasion present.     Comments: Skin tear on left elbow  Neurological:     Mental Status: She is alert and oriented to person, place, and time.     Cranial Nerves: No cranial nerve deficit.     Deep Tendon Reflexes: Reflexes are normal and symmetric.  Psychiatric:        Behavior: Behavior normal.        Thought Content: Thought content normal.        Judgment: Judgment normal.      BP (!) 95/52   Pulse (!) 41   Ht 5\' 3"  (1.6 m)   Wt 120 lb 3.2 oz (54.5 kg)   SpO2 96%   BMI 21.29 kg/m      Assessment & Plan:  [name] Williams comes in today with chief complaint of No chief complaint on file.   Diagnosis and orders addressed:  1. Insomnia, unspecified type  2. GAD (generalized anxiety disorder)  3. Hypotension, unspecified hypotension type - EKG 12-Lead - Ambulatory referral to Cardiology  4. Bradycardia - EKG 12-Lead - Ambulatory referral to Cardiology  5. Fall in home, initial encounter  6. Skin tear of left elbow without complication, initial encounter Keep clean and dry Report any s/s of infection     Evelina Dun, FNP

## 2021-05-11 NOTE — Patient Instructions (Signed)
Bradycardia, Adult Bradycardia is a slower-than-normal heartbeat. A normal resting heart rate for an adult ranges from 60 to 100 beats per minute. With bradycardia, the restingheart rate is less than 60 beats per minute. Bradycardia can prevent enough oxygen from reaching certain areas of your body when you are active. It can be serious if it keeps enough oxygen from reaching your brain and other parts of your body. Bradycardia is not a problem foreveryone. For some healthy adults, a slow resting heart rate is normal. What are the causes? This condition may be caused by: A problem with the heart, including: A problem with the heart's electrical system, such as a heart block. With a heart block, electrical signals between the chambers of the heart are partially or completely blocked, so they are not able to work as they should. A problem with the heart's natural pacemaker (sinus node). Heart disease. A heart attack. Heart damage. Lyme disease. A heart infection. A heart condition that is present at birth (congenital heart defect). Certain medicines that treat heart conditions. Certain conditions, such as hypothyroidism and obstructive sleep apnea. Problems with the balance of chemicals and other substances, like potassium, in the blood. Trauma. Radiation therapy. What increases the risk? You are more likely to develop this condition if you: Are age 65 or older. Have high blood pressure (hypertension), high cholesterol (hyperlipidemia), or diabetes. Drink heavily, use tobacco or nicotine products, or use drugs. What are the signs or symptoms? Symptoms of this condition include: Light-headedness. Feeling faint or fainting. Fatigue and weakness. Trouble with activity or exercise. Shortness of breath. Chest pain (angina). Drowsiness. Confusion. Dizziness. How is this diagnosed? This condition may be diagnosed based on: Your symptoms. Your medical history. A physical exam. During  the exam, your health care provider will listen to your heartbeat and check your pulse. To confirm the diagnosis, your health care provider may order tests, such as: Blood tests. An electrocardiogram (ECG). This test records the heart's electrical activity. The test can show how fast your heart is beating and whether the heartbeat is steady. A test in which you wear a portable device (event recorder or Holter monitor) to record your heart's electrical activity while you go about your day. An exercise test. How is this treated? Treatment for this condition depends on the cause of the condition and how severe your symptoms are. Treatment may involve: Treatment of the underlying condition. Changing your medicines or how much medicine you take. Having a small, battery-operated device called a pacemaker implanted under the skin. When bradycardia occurs, this device can be used to increase your heart rate and help your heart beat in a regular rhythm. Follow these instructions at home: Lifestyle  Manage any health conditions that contribute to bradycardia as told by your health care provider. Follow a heart-healthy diet. A nutrition specialist (dietitian) can help educate you about healthy food options and changes. Follow an exercise program that is approved by your health care provider. Maintain a healthy weight. Try to reduce or manage your stress, such as with yoga or meditation. If you need help reducing stress, ask your health care provider. Do not use any products that contain nicotine or tobacco, such as cigarettes, e-cigarettes, and chewing tobacco. If you need help quitting, ask your health care provider. Do not use illegal drugs. Limit alcohol intake to no more than 1 drink a day for nonpregnant women and 2 drinks a day for men. Be aware of how much alcohol is in your drink. In   the [place] one drink equals one 12 oz bottle of beer (355 mL), one 5 oz glass of wine (148 mL), or one 1 oz glass of  hard liquor (44 mL).  General instructions Take over-the-counter and prescription medicines only as told by your health care provider. Keep all follow-up visits as told by your health care provider. This is important. How is this prevented? In some cases, bradycardia may be prevented by: Treating underlying medical problems. Stopping behaviors or medicines that can trigger the condition. Contact a health care provider if you: Feel light-headed or dizzy. Almost faint. Feel weak or are easily fatigued during physical activity. Experience confusion or have memory problems. Get help right away if: You faint. You have: An irregular heartbeat (palpitations). Chest pain. Trouble breathing. Summary Bradycardia is a slower-than-normal heartbeat. With bradycardia, the resting heart rate is less than 60 beats per minute. Treatment for this condition depends on the cause. Manage any health conditions that contribute to bradycardia as told by your health care provider. Do not use any products that contain nicotine or tobacco, such as cigarettes, e-cigarettes, and chewing tobacco, and limit alcohol intake. Keep all follow-up visits as told by your health care provider. This is important. This information is not intended to replace advice given to you by your health care provider. Make sure you discuss any questions you have with your healthcare provider. Document Revised: 02/17/2018 Document Reviewed: 01/15/2018 Elsevier Patient Education  2022 [employer]vier Inc.  

## 2021-05-18 ENCOUNTER — Ambulatory Visit (HOSPITAL_COMMUNITY): Payer: Medicare HMO | Admitting: Physician Assistant

## 2021-05-24 ENCOUNTER — Ambulatory Visit (INDEPENDENT_AMBULATORY_CARE_PROVIDER_SITE_OTHER): Payer: Medicare Other | Admitting: Licensed Clinical Social Worker

## 2021-05-24 DIAGNOSIS — E785 Hyperlipidemia, unspecified: Secondary | ICD-10-CM

## 2021-05-24 DIAGNOSIS — F3342 Major depressive disorder, recurrent, in full remission: Secondary | ICD-10-CM

## 2021-05-24 DIAGNOSIS — F411 Generalized anxiety disorder: Secondary | ICD-10-CM

## 2021-05-24 DIAGNOSIS — I639 Cerebral infarction, unspecified: Secondary | ICD-10-CM

## 2021-05-24 DIAGNOSIS — K219 Gastro-esophageal reflux disease without esophagitis: Secondary | ICD-10-CM

## 2021-05-24 DIAGNOSIS — I10 Essential (primary) hypertension: Secondary | ICD-10-CM

## 2021-05-24 DIAGNOSIS — G4700 Insomnia, unspecified: Secondary | ICD-10-CM

## 2021-05-24 NOTE — Patient Instructions (Signed)
Visit Information  PATIENT GOALS:  Goals Addressed               This Visit's Progress     wants to better manage anxiety and stress issues faced (pt-stated)        Timeframe:  Short-Term Goal Priority:  Medium Start Date:         04/14/21                  Expected End Date:   08/08/21                   Follow Up Date  07/17/21 at 11:15 AM  Manage My Emotions (Patient). Manage anxiety and stress issues faced    Why is this important?   When you are stressed, down or upset, your body reacts too.  For example, your blood pressure may get higher; you may have a headache or stomachache.  When your emotions get the best of you, your body's ability to fight off cold and flu gets weak.  These steps will help you manage your emotions.     Patient Self Care Activities:  Attends all scheduled provider appointments  Patient Coping Strengths:  Family is supportive Eats meals with set up assistance Allows time to rest and relax  Patient Self Care Deficits:  Decreased appetite  Patient Goals:  - spend time or talk with others every day - practice relaxation or meditation daily - keep a calendar with appointment dates  Follow Up Plan: LCSW will call client or her son , Zenia Resides, on 07/17/21 at 11:15 AM to assess client needs     [name].Jomes Giraldo MSW, LCSW Licensed Clinical Social Worker [facility] Care Management [phone]

## 2021-05-24 NOTE — Chronic Care Management (AMB) (Signed)
Chronic Care Management    Clinical Social Work Note  05/24/2021 Name: [name] Williams MRN: [id number] DOB: [birth year]  [name] Williams is a 87 y.o. year old female who is a primary care patient of Sharion Balloon, FNP. The CCM team was consulted to assist the patient with chronic disease management and/or care coordination needs related to: [employer] .   Engaged with patient /son of patient, khari, lett telephone for follow up visit in response to provider referral for social work chronic care management and care coordination services.   Consent to Services:  The patient was given information about Chronic Care Management services, agreed to services, and gave verbal consent prior to initiation of services.  Please see initial visit note for detailed documentation.   Patient agreed to services and consent obtained.   Assessment: Review of patient past medical history, allergies, medications, and health status, including review of relevant consultants reports was performed today as part of a comprehensive evaluation and provision of chronic care management and care coordination services.     SDOH (Social Determinants of Health) assessments and interventions performed:  SDOH Interventions    Flowsheet Row Most Recent Value  SDOH Interventions   Physical Activity Interventions Other (Comments)  [client has walking challenges,  client had recent fall,  often she does not use a cane or walker to help her ambulate]  Depression Interventions/Treatment  Medication        Advanced Directives Status: See Vynca application for related entries.  CCM Care Plan  Allergies  Allergen Reactions   Latex     Per pt 11-10-14 she do not remember being allergic to this.../or    Outpatient Encounter Medications as of 05/24/2021  Medication Sig   ergocalciferol (VITAMIN D2) 1.25 MG (50000 UT) capsule Take 1 capsule (50,000 Units total) by mouth once a week.   venlafaxine XR (EFFEXOR XR) 75 MG  24 hr capsule Take 1 capsule (75 mg total) by mouth daily with breakfast.   No facility-administered encounter medications on file as of 05/24/2021.    Patient Active Problem List   Diagnosis Date Noted   CLL (chronic lymphocytic leukemia) ([place]) 04/07/2019   Leukocytosis 03/12/2019   Insomnia 01/07/2015   Osteopenia 01/07/2015   GAD (generalized anxiety disorder) 11/24/2014   Dermatitis, purulent 04/08/2014   Memory loss 01/28/2014   Fatigue 01/07/2014   Cerebral vascular accident ([place]) 12/03/2013   BRADYCARDIA 03/15/2010   SYNCOPE AND COLLAPSE 03/15/2010   Depression 09/14/2009   Transient cerebral ischemia 09/14/2009   GERD 09/14/2009   Hyperlipidemia 07/14/2008   HYPERTENSION, BENIGN 07/14/2008   Hypertrophic obstructive cardiomyopathy(425.11) 07/14/2008   Hypertrophic obstructive cardiomyopathy ([place]) 07/14/2008    Conditions to be addressed/monitored: monitor client management of anxiety and stress issues  Care Plan : LCSW Care Plan  Updates made by Katha Cabal, LCSW since 05/24/2021 12:00 AM     Problem: Anxiety Identification (Anxiety)      Goal: Manage Anxiety and Depression issues faced   Start Date: 04/14/2021  Expected End Date: 08/10/2021  This Visit's Progress: On track  Recent Progress: On track  Priority: Medium  Note:   Current Barriers:  Chronic Mental Health needs related to anxiety and stress management Decreased appetite Mobility issues; risk for falls Suicidal Ideation/Homicidal Ideation: No  Clinical Social Work Goal(s):  patient will work with SW monthly by telephone or in person to reduce or manage symptoms related to anxiety and stress issues faced patient will work with SW in next  30 days to address concerns related to mobilty challenges  Interventions: 1:1 collaboration with Sharion Balloon, FNP regarding development and update of comprehensive plan of care as evidenced by provider attestation and co-signature Discussed client  needs with Elson Clan, son of client.  Discussed with Zenia Resides the sleeping issues of client. Client has diagnosis of insomnia Reviewed with Zenia Resides client medication adherence.  Zenia Resides said that he thinks client is not taking medications on schedule. Zenia Resides said that client has memory issues and he thinks she just sometimes forgets to take prescribed medication.  Reviewed with Zenia Resides that Manus Rudd, son of client, resides in basement of client's home. Zenia Resides said that family has tried different approaches to try to get Carloyn Manner to move, have tried to seek placement for Carloyn Manner at facility for care, but no approaches have been successful. Zenia Resides said that he thinks client's depression and anxiety is related to  fact that Carloyn Manner is staying in her home and this causes emotional distress on client.                Discussed with Zenia Resides ambulation needs of client. He said client had a recent fall        He said client does not use a cane or walker to help her walk               Encouraged Zenia Resides to call RNCM as needed to discuss nursing needs of client          Collaborated with RNCM to discuss current nursing needs of client  Patient Self Care Activities:  Attends all scheduled provider appointments  Patient Coping Strengths:  Family is supportive Eats meals with set up assistance Allows time to rest and relax  Patient Self Care Deficits:  Decreased appetite  Patient Goals:  - spend time or talk with others every day - practice relaxation or meditation daily - keep a calendar with appointment dates  Follow Up Plan: LCSW will call client or her son, Zenia Resides, on 07/17/21 at 11:15 AM to discuss client needs     [name].Saraiyah Hemminger MSW, LCSW Licensed Clinical Social Worker [facility] Care Management [phone]

## 2021-05-25 ENCOUNTER — Telehealth: Payer: Self-pay

## 2021-05-25 NOTE — Chronic Care Management (AMB) (Signed)
  Chronic Care Management   Note  05/25/2021 Name: [name] Williams MRN: [id number] DOB: [birth year]  [name] Williams is a 87 y.o. year old female who is a primary care patient of Sharion Balloon, FNP. [name] Williams is currently enrolled in care management services. An additional referral for RN CM  was placed.   Follow up plan: Patient declines engagement by the RN CM . Appropriate care team members and provider have been notified via electronic communication.   [name] Williams, [address], [place], [place] [address] Direct Dial: [phone] [name] Website: [email or web address]

## 2021-06-02 NOTE — Progress Notes (Signed)
[name] Williams, [name] Williams [id number]   CLINIC:  Medical Oncology/Hematology  PCP:  [name] Williams, [place] [address] [phone]   REASON FOR VISIT:  Follow-up for CLL  PRIOR THERAPY: None  CURRENT THERAPY: Surveillance  INTERVAL HISTORY:  [name] Williams 87 y.o. female returns for routine follow-up of her chronic lymphocytic leukemia.  She was last seen by [name] Williams on 11/10/2020.  At today's visit, she reports feeling fairly well.  The only recent change to her baseline health status is that she sustained a fall in late September 2022 and was found to have bradycardia at the time with heart rate in the 40s.  She is scheduled to follow-up with cardiologist in early November.  Regarding her CLL, she denies any new lumps or bumps.  She has not had any unexplained fever, chills, night sweats.  Although she previously had some episodes of weight loss in the summer 2020, her weight has been stable at about 120 pounds over the past year.  She denies any frequent infections.  No new pains or hyperviscosity symptoms such as strokelike symptoms or TIA.  She does complain of fatigue and poor appetite, but is still able to keep up with her daily tasks.  She was able to walk about 100 yards without stopping to reach her appointment today.  However, she reports that she does rest in bed and sleep quite a bit more than she used to.  She has 30% energy and little to no appetite. She endorses that she is maintaining a stable weight.    REVIEW OF SYSTEMS:  Review of Systems  Constitutional:  Positive for appetite change and fatigue. Negative for chills, diaphoresis, fever and unexpected weight change.  HENT:   Negative for lump/mass and nosebleeds.   Eyes:  Negative for eye problems.  Respiratory:  Positive for cough (occasional) and shortness of breath (occasional). Negative for hemoptysis.   Cardiovascular:  Negative for chest pain,  leg swelling and palpitations.  Gastrointestinal:  Negative for abdominal pain, blood in stool, constipation, diarrhea, nausea and vomiting.  Genitourinary:  Negative for hematuria.   Skin: Negative.   Neurological:  Positive for dizziness (occasional). Negative for headaches and light-headedness.  Hematological:  Does not bruise/bleed easily.  Psychiatric/Behavioral:  Positive for depression. The patient is nervous/anxious.      PAST MEDICAL/SURGICAL HISTORY:  Past Medical History:  Diagnosis Date   Anxiety    Depressive disorder, not elsewhere classified    Esophageal reflux    Essential hypertension, benign    Hypertr obst cardiomyop    Other and unspecified hyperlipidemia    Stroke ([place])    Unspecified transient cerebral ischemia    Past Surgical History:  Procedure Laterality Date   BLADDER REPAIR     CERVIX SURGERY     CHOLECYSTECTOMY     TONSILLECTOMY     TUBAL LIGATION       SOCIAL HISTORY:  Social History   Socioeconomic History   Marital status: Widowed    Spouse name: Not on file   Number of children: 3   Years of education: Not on file   Highest education level: 9th grade  Occupational History   Not on file  Tobacco Use   Smoking status: Former   Smokeless tobacco: Never   Tobacco comments:    quit 2012  Vaping Use   Vaping Use: Never used  Substance and Sexual Activity   Alcohol use: No  Drug use: No   Sexual activity: Not Currently  Other Topics Concern   Not on file  Social History Narrative   Patient is retired.    Patient is widowed.    Patient has 3 children.    Patient is right handed.    Pt lives with son.     Social Determinants of Health   Financial Resource Strain: Not on file  Food Insecurity: Not on file  Transportation Needs: No Transportation Needs   Lack of Transportation (Medical): No   Lack of Transportation (Non-Medical): No  Physical Activity: Inactive   Days of Exercise per Week: 0 days   Minutes of Exercise per  Session: 0 min  Stress: Stress Concern Present   Feeling of Stress : To some extent  Social Connections: Not on file  Intimate Partner Violence: Not on file    FAMILY HISTORY:  Family History  Problem Relation Age of Onset   Breast cancer Mother    Stomach cancer Sister    Uterine cancer Sister    Depression Sister    Cancer Brother    Alcohol abuse Son     CURRENT MEDICATIONS:  Outpatient Encounter Medications as of 06/05/2021  Medication Sig   ergocalciferol (VITAMIN D2) 1.25 MG (50000 UT) capsule Take 1 capsule (50,000 Units total) by mouth once a week.   venlafaxine XR (EFFEXOR XR) 75 MG 24 hr capsule Take 1 capsule (75 mg total) by mouth daily with breakfast.   No facility-administered encounter medications on file as of 06/05/2021.    ALLERGIES:  Allergies  Allergen Reactions   Latex     Per pt 11-10-14 she do not remember being allergic to this.../or     PHYSICAL EXAM:  ECOG PERFORMANCE STATUS: 2 - Symptomatic, <50% confined to bed  There were no vitals filed for this visit. There were no vitals filed for this visit. Physical Exam Constitutional:      Appearance: Normal appearance.     Comments: Thin body habitus  HENT:     Head: Normocephalic and atraumatic.     Mouth/Throat:     Mouth: Mucous membranes are moist.  Eyes:     Extraocular Movements: Extraocular movements intact.     Pupils: Pupils are equal, round, and reactive to light.  Cardiovascular:     Rate and Rhythm: Normal rate and regular rhythm.     Pulses: Normal pulses.     Heart sounds: Normal heart sounds.  Pulmonary:     Effort: Pulmonary effort is normal.     Breath sounds: Normal breath sounds.  Abdominal:     General: Bowel sounds are normal.     Palpations: Abdomen is soft. There is no hepatomegaly or splenomegaly.     Tenderness: There is no abdominal tenderness.  Musculoskeletal:        General: No swelling.     Right lower leg: No edema.     Left lower leg: No edema.   Lymphadenopathy:     Cervical: No cervical adenopathy.  Skin:    General: Skin is warm and dry.  Neurological:     General: No focal deficit present.     Mental Status: She is alert and oriented to person, place, and time.  Psychiatric:        Mood and Affect: Mood normal.        Behavior: Behavior normal.     LABORATORY DATA:  I have reviewed the labs as listed.  CBC    Component Value  Date/Time   WBC 15.9 (H) 12/28/2020 1514   WBC 13.6 (H) 11/03/2020 1309   RBC 4.29 12/28/2020 1514   RBC 4.53 11/03/2020 1309   HGB 13.7 12/28/2020 1514   HCT 40.1 12/28/2020 1514   PLT 174 12/28/2020 1514   MCV 94 12/28/2020 1514   MCH 31.9 12/28/2020 1514   MCH 30.5 11/03/2020 1309   MCHC 34.2 12/28/2020 1514   MCHC 31.6 11/03/2020 1309   RDW 12.1 12/28/2020 1514   LYMPHSABS 10.2 (H) 12/28/2020 1514   MONOABS 1.1 (H) 11/03/2020 1309   EOSABS 0.1 12/28/2020 1514   BASOSABS 0.1 12/28/2020 1514   CMP Latest Ref Rng & Units 12/28/2020 11/03/2020 09/12/2020  Glucose 65 - 99 mg/dL 104(H) 97 101(H)  BUN 10 - 36 mg/dL _0 Creatinine 0.57 - 1.00 mg/dL 1.11(H) 1.12(H) 1.20(H)  Sodium 134 - 144 mmol/L 141 135 141  Potassium 3.5 - 5.2 mmol/L 4.8 4.2 4.7  Chloride 96 - 106 mmol/L 101 100 103  CO2 20 - 29 mmol/L _1 Calcium 8.7 - 10.3 mg/dL 8.9 9.0 9.1  Total Protein 6.5 - 8.1 g/dL - 6.9 6.8  Total Bilirubin 0.3 - 1.2 mg/dL - 1.1 0.7  Alkaline Phos 38 - 126 U/L - 72 93  AST 15 - 41 U/L - 19 25  ALT 0 - 44 U/L - 12 13    DIAGNOSTIC IMAGING:  I have independently reviewed the relevant imaging and discussed with the patient.  ASSESSMENT & PLAN: 1.  CLL, IGHV mutated, Tp53 negative: - She had elevated white count since 2009.  No fevers or night sweats.  Almost 20 pound weight loss in the last 2 months. - Peripheral blood flow cytometry was consistent with CLL. - CT CAP on 04/01/2019 showed mild mediastinal and bilateral hilar adenopathy measuring up to 11 mm.  Noncalcified  pulmonary nodule seen in the right upper lobe.  No hepatosplenomegaly. - CLL FISH panel showed trisomy 12 and 13 q.deletion. - She denies any fevers, night sweats or weight loss.   - No palpable adenopathy or splenomegaly on physical exam  - Most recent labs (06/05/2021): WBC 13.5 with absolute lymphocytes 8.7.  CMP unremarkable.  LDH normal. - We will continue to monitor at 12-monthintervals with repeat labs and physical exam. - PLAN: We will continue to monitor at 6 months intervals with repeat labs and physical exam.  2.  Weight loss: - Patient had about 20 pound weight loss in May 2020 through July 2020 following tick bite.  - Lyme disease and RMSF antibody panel was negative.  Alpha gal panel showed low positivity for beef and pork IgE. - She was on mirtazapine, which did not help.  Marinol was discontinued due to high co-pay. - She has been drinking 1 can of Boost per day.   - Weight has been stable at approximately 120 pounds for the past year - PLAN: Encouraged continued oral intake, recommend increasing to 2 cans of Boost per day.  3.  Vitamin D deficiency: - Vitamin D level (11/03/2020) was low at 18.  She was started on vitamin D 50,000 units weekly.   - Most recent vitamin D is pending, will follow - PLAN: Continue vitamin D 50,000 units weekly  4.   CKD: - She had mild CKD since February 2016.  5.  Depression: - Continue venlafaxine and BuSpar.  6.  Health maintenance: - Last colonoscopy was 05/2002 which showed normal rectum and diverticular disease.  PLAN SUMMARY & DISPOSITION: -Repeat labs and RTC in 6 months  All questions were answered. The patient knows to call the clinic with any problems, questions or concerns.  Medical decision making: Low  Time spent on visit: I spent 20 minutes counseling the patient face to face. The total time spent in the appointment was 30 minutes and more than 50% was on counseling.   Harriett Rush, PA-C  06/05/2021 3:46  PM

## 2021-06-05 ENCOUNTER — Inpatient Hospital Stay (HOSPITAL_COMMUNITY): Payer: Medicare Other

## 2021-06-05 ENCOUNTER — Inpatient Hospital Stay (HOSPITAL_COMMUNITY): Payer: Medicare Other | Attending: Physician Assistant | Admitting: Physician Assistant

## 2021-06-05 ENCOUNTER — Other Ambulatory Visit: Payer: Self-pay

## 2021-06-05 VITALS — BP 141/75 | HR 62 | Temp 97.6°F | Resp 16 | Wt 120.6 lb

## 2021-06-05 DIAGNOSIS — R634 Abnormal weight loss: Secondary | ICD-10-CM | POA: Insufficient documentation

## 2021-06-05 DIAGNOSIS — F32A Depression, unspecified: Secondary | ICD-10-CM | POA: Insufficient documentation

## 2021-06-05 DIAGNOSIS — Z87891 Personal history of nicotine dependence: Secondary | ICD-10-CM | POA: Diagnosis not present

## 2021-06-05 DIAGNOSIS — Z79899 Other long term (current) drug therapy: Secondary | ICD-10-CM | POA: Diagnosis not present

## 2021-06-05 DIAGNOSIS — C9110 Chronic lymphocytic leukemia of B-cell type not having achieved remission: Secondary | ICD-10-CM | POA: Diagnosis not present

## 2021-06-05 DIAGNOSIS — E559 Vitamin D deficiency, unspecified: Secondary | ICD-10-CM | POA: Insufficient documentation

## 2021-06-05 DIAGNOSIS — N189 Chronic kidney disease, unspecified: Secondary | ICD-10-CM | POA: Insufficient documentation

## 2021-06-05 LAB — CBC WITH DIFFERENTIAL/PLATELET
Abs Immature Granulocytes: 0.04 10*3/uL (ref 0.00–0.07)
Basophils Absolute: 0.1 10*3/uL (ref 0.0–0.1)
Basophils Relative: 1 %
Eosinophils Absolute: 0.1 10*3/uL (ref 0.0–0.5)
Eosinophils Relative: 1 %
HCT: 45.3 % (ref 36.0–46.0)
Hemoglobin: 14.7 g/dL (ref 12.0–15.0)
Immature Granulocytes: 0 %
Lymphocytes Relative: 65 %
Lymphs Abs: 8.7 10*3/uL — ABNORMAL HIGH (ref 0.7–4.0)
MCH: 31 pg (ref 26.0–34.0)
MCHC: 32.5 g/dL (ref 30.0–36.0)
MCV: 95.6 fL (ref 80.0–100.0)
Monocytes Absolute: 1 10*3/uL (ref 0.1–1.0)
Monocytes Relative: 7 %
Neutro Abs: 3.6 10*3/uL (ref 1.7–7.7)
Neutrophils Relative %: 26 %
Platelets: 153 10*3/uL (ref 150–400)
RBC: 4.74 MIL/uL (ref 3.87–5.11)
RDW: 12.3 % (ref 11.5–15.5)
WBC: 13.5 10*3/uL — ABNORMAL HIGH (ref 4.0–10.5)
nRBC: 0 % (ref 0.0–0.2)

## 2021-06-05 LAB — COMPREHENSIVE METABOLIC PANEL
ALT: 12 U/L (ref 0–44)
AST: 18 U/L (ref 15–41)
Albumin: 3.9 g/dL (ref 3.5–5.0)
Alkaline Phosphatase: 67 U/L (ref 38–126)
Anion gap: 6 (ref 5–15)
BUN: 12 mg/dL (ref 8–23)
CO2: 31 mmol/L (ref 22–32)
Calcium: 9.3 mg/dL (ref 8.9–10.3)
Chloride: 103 mmol/L (ref 98–111)
Creatinine, Ser: 1.06 mg/dL — ABNORMAL HIGH (ref 0.44–1.00)
GFR, Estimated: 50 mL/min — ABNORMAL LOW (ref >60)
Glucose, Bld: 98 mg/dL (ref 70–99)
Potassium: 5.1 mmol/L (ref 3.5–5.1)
Sodium: 140 mmol/L (ref 135–145)
Total Bilirubin: 1.1 mg/dL (ref 0.3–1.2)
Total Protein: 6.6 g/dL (ref 6.5–8.1)

## 2021-06-05 LAB — VITAMIN D 25 HYDROXY (VIT D DEFICIENCY, FRACTURES): Vit D, 25-Hydroxy: 80.69 ng/mL (ref 30–100)

## 2021-06-05 LAB — LACTATE DEHYDROGENASE: LDH: 134 U/L (ref 98–192)

## 2021-06-05 NOTE — Patient Instructions (Signed)
[place] at [facility] Discharge Instructions  You were seen today by Tarri Abernethy PA-C for your CLL (chronic lymphocytic leukemia).  At this time, your labs are stable and you do not meet criteria for treatment at this time.  We will check your labs again and see you for follow-up visit in 6 months.  However, if you experience any significant symptoms before that time, please call our office to schedule follow-up sooner rather than later.  Symptoms that you should be on the look out for would be any new lumps or bumps, any unexplained fever/chills, any unexplained weight loss, or night sweats.  LABS: Return in 6 months for repeat labs  OTHER TESTS: No other tests at this time  MEDICATIONS: Continue taking previously prescribed home medications  FOLLOW-UP APPOINTMENT: Office visit in 6 months, after labs   Thank you for choosing [place] at [facility] to provide your oncology and hematology care.  To afford each patient quality time with our provider, please arrive at least 15 minutes before your scheduled appointment time.   If you have a lab appointment with the [facility] please come in thru the Main Entrance and check in at the main information desk.  You need to re-schedule your appointment should you arrive 10 or more minutes late.  We strive to give you quality time with our providers, and arriving late affects you and other patients whose appointments are after yours.  Also, if you no show three or more times for appointments you may be dismissed from the clinic at the providers discretion.     Again, thank you for choosing [facility].  Our hope is that these requests will decrease the amount of time that you wait before being seen by our physicians.       _____________________________________________________________  Should you have questions after your visit to [facility], please contact our  office at [phone] and follow the prompts.  Our office hours are 8:00 a.m. and 4:30 p.m. Monday - Friday.  Please note that voicemails left after 4:00 p.m. may not be returned until the following business day.  We are closed weekends and major holidays.  You do have access to a nurse 24-7, just call the main number to the clinic [phone] and do not press any options, hold on the line and a nurse will answer the phone.    For prescription refill requests, have your pharmacy contact our office and allow 72 hours.    Due to Covid, you will need to wear a mask upon entering the hospital. If you do not have a mask, a mask will be given to you at the Main Entrance upon arrival. For doctor visits, patients may have 1 support person age 51 or older with them. For treatment visits, patients can not have anyone with them due to social distancing guidelines and our immunocompromised population.

## 2021-06-13 ENCOUNTER — Ambulatory Visit (INDEPENDENT_AMBULATORY_CARE_PROVIDER_SITE_OTHER): Payer: Medicare Other | Admitting: Family

## 2021-06-13 ENCOUNTER — Telehealth: Payer: Self-pay | Admitting: Family

## 2021-06-13 ENCOUNTER — Other Ambulatory Visit: Payer: Self-pay

## 2021-06-13 ENCOUNTER — Encounter: Payer: Self-pay | Admitting: Family

## 2021-06-13 VITALS — BP 132/75 | HR 65 | Temp 97.2°F | Resp 20 | Ht 63.0 in | Wt 119.0 lb

## 2021-06-13 DIAGNOSIS — I10 Essential (primary) hypertension: Secondary | ICD-10-CM

## 2021-06-13 DIAGNOSIS — E785 Hyperlipidemia, unspecified: Secondary | ICD-10-CM

## 2021-06-13 DIAGNOSIS — K219 Gastro-esophageal reflux disease without esophagitis: Secondary | ICD-10-CM

## 2021-06-13 DIAGNOSIS — G4700 Insomnia, unspecified: Secondary | ICD-10-CM | POA: Diagnosis not present

## 2021-06-13 DIAGNOSIS — C9110 Chronic lymphocytic leukemia of B-cell type not having achieved remission: Secondary | ICD-10-CM | POA: Diagnosis not present

## 2021-06-13 DIAGNOSIS — R5383 Other fatigue: Secondary | ICD-10-CM

## 2021-06-13 DIAGNOSIS — F3342 Major depressive disorder, recurrent, in full remission: Secondary | ICD-10-CM

## 2021-06-13 DIAGNOSIS — F411 Generalized anxiety disorder: Secondary | ICD-10-CM | POA: Diagnosis not present

## 2021-06-13 NOTE — Telephone Encounter (Signed)
LVM for pt to rtn my call to schedule AWV with [facility].  

## 2021-06-13 NOTE — Progress Notes (Signed)
Subjective:    Patient ID: [name] Williams, female    DOB: 05-02-1928, 87 y.o.   MRN: [id number]  Chief Complaint  Patient presents with   Medical Management of Chronic Issues   Pt presents to the office today for chronic follow up.   She states her anxiety and depression has worsen. Her son lives in her basement and he is alcoholic. They are currently working on getting him into a nursing home. She reports this is a great deal of stress on her.     She has CLL and is followed by Oncologists. Anxiety Presents for follow-up visit. Symptoms include depressed mood, excessive worry, insomnia, irritability, nervous/anxious behavior and restlessness. Patient reports no shortness of breath. Symptoms occur most days. The severity of symptoms is moderate.    Depression        This is a chronic problem.  The current episode started more than 1 year ago.   The onset quality is gradual.   The problem occurs intermittently.  Associated symptoms include helplessness, hopelessness, insomnia, irritable, restlessness and sad.  Past treatments include SNRIs - Serotonin and norepinephrine reuptake inhibitors.  Past medical history includes anxiety.   Hypertension This is a chronic problem. The current episode started more than 1 year ago. The problem has been resolved since onset. Associated symptoms include anxiety and malaise/fatigue. Pertinent negatives include no peripheral edema or shortness of breath. Risk factors for coronary artery disease include dyslipidemia. The current treatment provides moderate improvement.  Insomnia Primary symptoms: difficulty falling asleep, frequent awakening, malaise/fatigue.   The current episode started more than one year. The onset quality is gradual. The problem has been waxing and waning since onset. PMH includes: depression.   Hyperlipidemia This is a chronic problem. The current episode started more than 1 year ago. Pertinent negatives include no shortness of breath.  [name] on no antihyperlipidemic treatment. The current treatment provides mild improvement of lipids.     Review of Systems  Constitutional:  Positive for irritability and malaise/fatigue.  Respiratory:  Negative for shortness of breath.   Psychiatric/Behavioral:  Positive for depression. The patient is nervous/anxious and has insomnia.   All other systems reviewed and are negative.     Objective:   Physical Exam Vitals reviewed.  Constitutional:      General: She is irritable. She is not in acute distress.    Appearance: She is well-developed.  HENT:     Head: Normocephalic and atraumatic.     Right Ear: Tympanic membrane normal.     Left Ear: Tympanic membrane normal.  Eyes:     Pupils: Pupils are equal, round, and reactive to light.  Neck:     Thyroid: No thyromegaly.  Cardiovascular:     Rate and Rhythm: Normal rate and regular rhythm.     Heart sounds: Normal heart sounds. No murmur heard. Pulmonary:     Effort: Pulmonary effort is normal. No respiratory distress.     Breath sounds: Normal breath sounds. No wheezing.  Abdominal:     General: Bowel sounds are normal. There is no distension.     Palpations: Abdomen is soft.     Tenderness: There is no abdominal tenderness.  Musculoskeletal:        General: No tenderness. Normal range of motion.     Cervical back: Normal range of motion and neck supple.  Skin:    General: Skin is warm and dry.  Neurological:     Mental Status: She is alert  and oriented to person, place, and time.     Cranial Nerves: No cranial nerve deficit.     Deep Tendon Reflexes: Reflexes are normal and symmetric.  Psychiatric:        Behavior: Behavior normal.        Thought Content: Thought content normal.        Judgment: Judgment normal.      BP 132/75   Pulse 65   Temp (!) 97.2 F (36.2 C) (Temporal)   Resp 20   Ht 5\' 3"  (1.6 m)   Wt 119 lb (54 kg)   SpO2 98%   BMI 21.08 kg/m    Assessment & Plan:  [name] Williams  comes in today with chief complaint of Medical Management of Chronic Issues   Diagnosis and orders addressed:  1. HYPERTENSION, BENIGN  2. Gastroesophageal reflux disease, unspecified whether esophagitis present  3. CLL (chronic lymphocytic leukemia) ([name] Williams)  4. GAD (generalized anxiety disorder)   5. Insomnia, unspecified type  6. Other fatigue  7. Hyperlipidemia, unspecified hyperlipidemia type  8. Recurrent major depressive disorder, in full remission ([name] Williams)   Labs reviewed from [name] Williams Maintenance reviewed Diet and exercise encouraged  Follow up plan: 3 months    Evelina Dun, FNP

## 2021-06-13 NOTE — Patient Instructions (Signed)

## 2021-06-16 ENCOUNTER — Other Ambulatory Visit: Payer: Self-pay

## 2021-06-16 ENCOUNTER — Ambulatory Visit (INDEPENDENT_AMBULATORY_CARE_PROVIDER_SITE_OTHER): Payer: Medicare Other | Admitting: Family Medicine

## 2021-06-16 ENCOUNTER — Ambulatory Visit (INDEPENDENT_AMBULATORY_CARE_PROVIDER_SITE_OTHER): Payer: Medicare Other

## 2021-06-16 ENCOUNTER — Encounter: Payer: Self-pay | Admitting: Family Medicine

## 2021-06-16 VITALS — BP 131/65 | HR 62 | Ht 63.0 in | Wt 119.0 lb

## 2021-06-16 DIAGNOSIS — C9110 Chronic lymphocytic leukemia of B-cell type not having achieved remission: Secondary | ICD-10-CM

## 2021-06-16 DIAGNOSIS — R079 Chest pain, unspecified: Secondary | ICD-10-CM

## 2021-06-16 DIAGNOSIS — F19939 Other psychoactive substance use, unspecified with withdrawal, unspecified: Secondary | ICD-10-CM | POA: Diagnosis not present

## 2021-06-16 DIAGNOSIS — I639 Cerebral infarction, unspecified: Secondary | ICD-10-CM

## 2021-06-16 DIAGNOSIS — F411 Generalized anxiety disorder: Secondary | ICD-10-CM

## 2021-06-16 NOTE — Progress Notes (Signed)
BP 131/65   Pulse 62   Ht 5\' 3"  (1.6 m)   Wt 119 lb (54 kg)   SpO2 98%   BMI 21.08 kg/m    Subjective:   Patient ID: [name] Williams, female    DOB: Jan 17, 1928, 87 y.o.   MRN: [id number]  HPI: [name] Williams is a 87 y.o. female presenting on 06/16/2021 for Medical Management of Chronic Issues   HPI Patient just says she feels funny in her chest and throughout her body.  She does not know why.  She just wanted to come in and get a chest x-ray and see if there was anything wrong there.  She says she is not felt her heart flutter or chest pain but just feels funny.  She denies any cough or fever or chills or wheezing.  Relevant past medical, surgical, family and social history reviewed and updated as indicated. Interim medical history since our last visit reviewed. Allergies and medications reviewed and updated.  Review of Systems  Constitutional:  Negative for chills and fever.  HENT:  Negative for congestion, ear discharge and ear pain.   Eyes:  Negative for redness and visual disturbance.  Respiratory:  Negative for chest tightness and shortness of breath.   Cardiovascular:  Negative for chest pain and leg swelling.  Genitourinary:  Negative for difficulty urinating and dysuria.  Musculoskeletal:  Negative for back pain and gait problem.  Skin:  Negative for rash.  Neurological:  Negative for light-headedness and headaches.  Psychiatric/Behavioral:  Negative for agitation and behavioral problems.   All other systems reviewed and are negative.  Per HPI unless specifically indicated above   Allergies as of 06/16/2021       Reactions   Latex    Per pt 11-10-14 she do not remember being allergic to this.../or        Medication List        Accurate as of June 16, 2021  4:35 PM. If you have any questions, ask your nurse or doctor.          ergocalciferol 1.25 MG (50000 UT) capsule Commonly known as: VITAMIN D2 Take 1 capsule (50,000 Units total) by mouth once a  week.   meclizine 25 MG tablet Commonly known as: ANTIVERT Take 25 mg by mouth 3 (three) times daily as needed.   venlafaxine XR 75 MG 24 hr capsule Commonly known as: Effexor XR Take 1 capsule (75 mg total) by mouth daily with breakfast.         Objective:   BP 131/65   Pulse 62   Ht 5\' 3"  (1.6 m)   Wt 119 lb (54 kg)   SpO2 98%   BMI 21.08 kg/m   Wt Readings from Last 3 Encounters:  06/16/21 119 lb (54 kg)  06/13/21 119 lb (54 kg)  06/05/21 120 lb 9.5 oz (54.7 kg)    Physical Exam Vitals and nursing note reviewed.  Constitutional:      General: She is not in acute distress.    Appearance: She is well-developed. She is not diaphoretic.  Eyes:     Conjunctiva/sclera: Conjunctivae normal.     Pupils: Pupils are equal, round, and reactive to light.  Cardiovascular:     Rate and Rhythm: Normal rate and regular rhythm.     Heart sounds: Normal heart sounds. No murmur heard. Pulmonary:     Effort: Pulmonary effort is normal. No respiratory distress.     Breath sounds: Normal breath sounds. No  wheezing.  Musculoskeletal:        General: No tenderness. Normal range of motion.  Skin:    General: Skin is warm and dry.     Findings: No rash.  Neurological:     Mental Status: She is alert and oriented to person, place, and time.     Coordination: Coordination normal.  Psychiatric:        Behavior: Behavior normal.     Chest x-ray clear Assessment & Plan:   Problem List Items Addressed This Visit   None Visit Diagnoses     Withdrawal from other psychoactive substance ([place])    -  Primary     Patient has not been taking the Effexor every day and that may be why she is having the symptoms.  Follow up plan: Return if symptoms worsen or fail to improve.  Counseling provided for all of the vaccine components No orders of the defined types were placed in this encounter.   Caryl Pina, MD [facility] 06/16/2021, 4:35 PM

## 2021-06-19 DIAGNOSIS — F3342 Major depressive disorder, recurrent, in full remission: Secondary | ICD-10-CM | POA: Diagnosis not present

## 2021-06-19 DIAGNOSIS — E785 Hyperlipidemia, unspecified: Secondary | ICD-10-CM | POA: Diagnosis not present

## 2021-06-19 DIAGNOSIS — I639 Cerebral infarction, unspecified: Secondary | ICD-10-CM | POA: Diagnosis not present

## 2021-06-19 DIAGNOSIS — I10 Essential (primary) hypertension: Secondary | ICD-10-CM | POA: Diagnosis not present

## 2021-06-22 ENCOUNTER — Ambulatory Visit: Payer: Medicare Other | Admitting: Cardiology

## 2021-07-17 ENCOUNTER — Telehealth: Payer: Medicare Other

## 2021-07-31 ENCOUNTER — Ambulatory Visit: Payer: Medicare Other | Admitting: Licensed Clinical Social Worker

## 2021-07-31 DIAGNOSIS — F3342 Major depressive disorder, recurrent, in full remission: Secondary | ICD-10-CM

## 2021-07-31 DIAGNOSIS — I10 Essential (primary) hypertension: Secondary | ICD-10-CM

## 2021-07-31 DIAGNOSIS — G4700 Insomnia, unspecified: Secondary | ICD-10-CM

## 2021-07-31 DIAGNOSIS — I639 Cerebral infarction, unspecified: Secondary | ICD-10-CM

## 2021-07-31 DIAGNOSIS — E785 Hyperlipidemia, unspecified: Secondary | ICD-10-CM

## 2021-07-31 DIAGNOSIS — K219 Gastro-esophageal reflux disease without esophagitis: Secondary | ICD-10-CM

## 2021-07-31 DIAGNOSIS — F411 Generalized anxiety disorder: Secondary | ICD-10-CM

## 2021-07-31 NOTE — Chronic Care Management (AMB) (Signed)
Chronic Care Management    Clinical Social Work Note  07/31/2021 Name: [name] Williams MRN: [id number] DOB: 05-18-1928  [name] Williams is a 87 y.o. year old female who is a primary care patient of Sharion Balloon, FNP. The CCM team was consulted to assist the patient with chronic disease management and/or care coordination needs related to: [employer] .   Engaged with patient / daughter of patient, [name] Williams, by telephone for follow up visit in response to provider referral for social work chronic care management and care coordination services.   Consent to Services:  The patient was given information about Chronic Care Management services, agreed to services, and gave verbal consent prior to initiation of services.  Please see initial visit note for detailed documentation.   Patient agreed to services and consent obtained.   Assessment: Review of patient past medical history, allergies, medications, and health status, including review of relevant consultants reports was performed today as part of a comprehensive evaluation and provision of chronic care management and care coordination services.     SDOH (Social Determinants of Health) assessments and interventions performed:  SDOH Interventions    Flowsheet Row Most Recent Value  SDOH Interventions   Physical Activity Interventions Other (Comments)  [client has decreased activity.  she rests in bed until mid-morning on many days. she often has decreased motivation to do activity.]  Stress Interventions Other (Comment)  [client has stress related to issues of conflict with her son who resides in basement of home.]  Depression Interventions/Treatment  Medication        Advanced Directives Status: See Vynca application for related entries.  CCM Care Plan  Allergies  Allergen Reactions   Latex     Per pt 11-10-14 she do not remember being allergic to this.../or    Outpatient Encounter Medications as of 07/31/2021   Medication Sig   ergocalciferol (VITAMIN D2) 1.25 MG (50000 UT) capsule Take 1 capsule (50,000 Units total) by mouth once a week.   meclizine (ANTIVERT) 25 MG tablet Take 25 mg by mouth 3 (three) times daily as needed.   venlafaxine XR (EFFEXOR XR) 75 MG 24 hr capsule Take 1 capsule (75 mg total) by mouth daily with breakfast.   No facility-administered encounter medications on file as of 07/31/2021.    Patient Active Problem List   Diagnosis Date Noted   CLL (chronic lymphocytic leukemia) ([place]) 04/07/2019   Leukocytosis 03/12/2019   Insomnia 01/07/2015   Osteopenia 01/07/2015   GAD (generalized anxiety disorder) 11/24/2014   Dermatitis, purulent 04/08/2014   Memory loss 01/28/2014   Fatigue 01/07/2014   Cerebral vascular accident ([place]) 12/03/2013   BRADYCARDIA 03/15/2010   SYNCOPE AND COLLAPSE 03/15/2010   Depression 09/14/2009   Transient cerebral ischemia 09/14/2009   GERD 09/14/2009   Hyperlipidemia 07/14/2008   HYPERTENSION, BENIGN 07/14/2008   Hypertrophic obstructive cardiomyopathy(425.11) 07/14/2008   Hypertrophic obstructive cardiomyopathy ([place]) 07/14/2008    Conditions to be addressed/monitored: monitor client management of anxiety and stress issues faced  Care Plan : LCSW Care Plan  Updates made by Katha Cabal, LCSW since 07/31/2021 12:00 AM     Problem: Anxiety Identification (Anxiety)      Goal: Manage Anxiety and Depression issues faced   Start Date: 07/31/2021  Expected End Date: 10/23/2021  This Visit's Progress: On track  Recent Progress: On track  Priority: Medium  Note:   Current Barriers:  Chronic Mental Health needs related to anxiety and stress management Decreased appetite Mobility issues;  risk for falls Stress issues with son who resides in basement Suicidal Ideation/Homicidal Ideation: No  Clinical Social Work Goal(s):  patient will work with SW monthly by telephone or in person to reduce or manage symptoms related to anxiety and  stress issues faced patient will work with SW in next 30 days to address concerns related to [employer] challenges Patient will communicate with RNCM in next 30 days to discuss nursing needs of client  Interventions: 1:1 collaboration with Sharion Balloon, FNP regarding development and update of comprehensive plan of care as evidenced by provider attestation and co-signature Discussed client needs with [name] Williams, daughter of client.  Discussed with Mariann Laster the sleeping issues of client. Client has diagnosis of insomnia. Mariann Laster said often client will sleep until mid-morning .  She said client has reduced motivation, has decreased energy to do activities Discussed appetite of client. Mariann Laster said that client does drink two ensures per day. But, she said client only eats small meals at meal time. Reviewed with Mariann Laster the situation of client's son who resides in basement of client home.  Mariann Laster said son of client resides in home of client. This son has drinking issues and does not help client very much around the home.  Per Mariann Laster, his living area in basement often needs cleaning and is often dirty.  Mariann Laster said that client will often drive her son to and from store to pick up items he needs.  Son of client who resides in basement of client home has refused to go to another residence. This has caused stress and anxiety on client, Nannie Lakey. Mariann Laster and LCSW discussed depression issues of client. Again, client would like to socialize with others but many of her friends are now deceased.  She does have a few friends from church.  Client likes to go to [facility] and shop.  Encouraged client or Mariann Laster to call RNCM to discuss nursing needs of client Discussed with Mariann Laster the services of CCM program  Patient Self Care Activities:  Attends all scheduled provider appointments  Patient Coping Strengths:  Family is supportive Eats meals with set up assistance Allows time to rest and relax  Patient Self Care Deficits:   Decreased appetite Memory issues Risk of falls   Patient Goals:  - spend time or talk with others every day - practice relaxation or meditation daily - keep a calendar with appointment dates  Follow Up Plan: LCSW will call client or her daughter, Mariann Laster, on 09/25/21 at 9:00 AM to assess client needs at that time.     [name].Miel Wisener MSW, LCSW Licensed Clinical Social Worker [facility] Care Management [phone]

## 2021-07-31 NOTE — Patient Instructions (Addendum)
Visit Information  Patient Goals: Manage My Emotions (Patient).  Manage anxiety and stress issues faced  Timeframe:  Short-Term Goal Priority:  Medium Progress: On Track Start Date:        07/31/21                  Expected End Date:   10/23/21                   Follow Up Date    09/25/21 at 9:00 AM  Manage My Emotions (Patient). Manage anxiety and stress issues faced    Why is this important?   When you are stressed, down or upset, your body reacts too.  For example, your blood pressure may get higher; you may have a headache or stomachache.  When your emotions get the best of you, your body's ability to fight off cold and flu gets weak.  These steps will help you manage your emotions.     Patient Self Care Activities:  Attends all scheduled provider appointments  Patient Coping Strengths:  Family is supportive Eats meals with set up assistance Allows time to rest and relax  Patient Self Care Deficits:  Decreased appetite  Patient Goals:  - spend time or talk with others every day - practice relaxation or meditation daily - keep a calendar with appointment dates  Follow Up Plan: LCSW will call client or her daughter, Mariann Laster on 09/25/21 at 9:00 AM to assess client needs   [name].Janes Colegrove MSW, LCSW Licensed Clinical Social Worker [facility] Care Management [phone]

## 2021-09-14 ENCOUNTER — Encounter: Payer: Self-pay | Admitting: Family

## 2021-09-14 ENCOUNTER — Ambulatory Visit: Payer: Medicare Other | Admitting: Family

## 2021-09-15 ENCOUNTER — Ambulatory Visit (INDEPENDENT_AMBULATORY_CARE_PROVIDER_SITE_OTHER): Payer: Medicare Other | Admitting: Family

## 2021-09-15 ENCOUNTER — Encounter: Payer: Self-pay | Admitting: Family

## 2021-09-15 VITALS — BP 116/52 | HR 41 | Temp 97.4°F | Ht 63.0 in | Wt 119.0 lb

## 2021-09-15 DIAGNOSIS — F411 Generalized anxiety disorder: Secondary | ICD-10-CM | POA: Diagnosis not present

## 2021-09-15 DIAGNOSIS — C9110 Chronic lymphocytic leukemia of B-cell type not having achieved remission: Secondary | ICD-10-CM | POA: Diagnosis not present

## 2021-09-15 DIAGNOSIS — F3342 Major depressive disorder, recurrent, in full remission: Secondary | ICD-10-CM

## 2021-09-15 DIAGNOSIS — G4700 Insomnia, unspecified: Secondary | ICD-10-CM

## 2021-09-15 MED ORDER — DESVENLAFAXINE SUCCINATE ER 50 MG PO TB24
50.0000 mg | ORAL_TABLET | Freq: Every day | ORAL | 0 refills | Status: DC
Start: 2021-09-15 — End: 2021-11-23

## 2021-09-15 NOTE — Patient Instructions (Signed)
Major Depressive Disorder, Adult °Major depressive disorder (MDD) is a mental health condition. It may also be called clinical depression or unipolar depression. MDD causes symptoms of sadness, hopelessness, and loss of interest in things. These symptoms last most of the day, almost every day, for 2 weeks. MDD can also cause physical symptoms. It can interfere with relationships and with everyday activities, such as work, school, and activities that are usually pleasant. °MDD may be mild, moderate, or severe. It may be single-episode MDD, which happens once, or recurrent MDD, which may occur multiple times. °What are the causes? °The exact cause of this condition is not known. MDD is most likely caused by a combination of things, which may include: °Your personality traits. °Learned or conditioned behaviors or thoughts or feelings that reinforce negativity. °Any alcohol or substance misuse. °Long-term (chronic) physical or mental health illness. °Going through a traumatic experience or major life changes. °What increases the risk? °The following factors may make someone more likely to develop MDD: °A family history of depression. °Being a woman. °Troubled family relationships. °Abnormally low levels of certain brain chemicals. °Traumatic or painful events in childhood, especially abuse or loss of a parent. °A lot of stress from life experiences, such as poor living conditions or discrimination. °Chronic physical illness or other mental health disorders. °What are the signs or symptoms? °The main symptoms of MDD usually include: °Constant depressed or irritable mood. °A loss of interest in things and activities. °Other symptoms include: °Sleeping or eating too much or too little. °Unexplained weight gain or weight loss. °Tiredness or low energy. °Being agitated, restless, or weak. °Feeling hopeless, worthless, or guilty. °Trouble thinking clearly or making decisions. °Thoughts of suicide or thoughts of harming  others. °Isolating oneself or avoiding other people or activities. °Trouble completing tasks, work, or any normal obligations. °Severe symptoms of this condition may include: °Psychotic depression.This may include false beliefs, or delusions. It may also include seeing, hearing, tasting, smelling, or feeling things that are not real (hallucinations). °Chronic depression or persistent depressive disorder. This is low-level depression that lasts for at least 2 years. °Melancholic depression, or feeling extremely sad and hopeless. °Catatonic depression, which includes trouble speaking and trouble moving. °How is this diagnosed? °This condition may be diagnosed based on: °Your symptoms. °Your medical and mental health history. You may be asked questions about your lifestyle, including any drug and alcohol use. °A physical exam. °Blood tests to rule out other conditions. °MDD is confirmed if you have the following symptoms most of the day, nearly every day, in a 2-week period: °Either a depressed mood or loss of interest. °At least four other MDD symptoms. °How is this treated? °This condition is usually treated by mental health professionals, such as psychologists, psychiatrists, and clinical social workers. You may need more than one type of treatment. Treatment may include: °Psychotherapy, also called talk therapy or counseling. Types of psychotherapy include: °Cognitive behavioral therapy (CBT). This teaches you to recognize unhealthy feelings, thoughts, and behaviors, and replace them with positive thoughts and actions. °Interpersonal therapy (IPT). This helps you to improve the way you communicate with others or relate to them. °Family therapy. This treatment includes members of your family. °Medicines to treat anxiety and depression. These medicines help to balance the brain chemicals that affect your emotions. °Lifestyle changes. You may be asked to: °Limit alcohol use and avoid drug use. °Get regular  exercise. °Get plenty of sleep. °Make healthy eating choices. °Spend more time outdoors. °Brain stimulation. This may   be done if symptoms are very severe and other treatments have not worked. Examples of this treatment are electroconvulsive therapy and transcranial magnetic stimulation. °Follow these instructions at home: °Activity °Exercise regularly and spend time outdoors. °Find activities that you enjoy doing, and make time to do them. °Find healthy ways to manage stress, such as: °Meditation or deep breathing. °Spending time in nature. °Journaling. °Return to your normal activities as told by your health care provider. Ask your health care provider what activities are safe for you. °Alcohol and drug use °If you drink alcohol: °Limit how much you use to: °0-1 drink a day for women who are not pregnant. °0-2 drinks a day for men. °Be aware of how much alcohol is in your drink. In the [place] one drink equals one 12 oz bottle of beer (355 mL), one 5 oz glass of wine (148 mL), or one 1½ oz glass of hard liquor (44 mL). °Discuss your alcohol use with your health care provider. Alcohol can affect any antidepressant medicines you are taking. °Discuss any drug use with your health care provider. °General instructions ° °Take over-the-counter and prescription medicines only as told by your health care provider. °Eat a healthy diet and get plenty of sleep. °Consider joining a support group. Your health care provider may be able to recommend one. °Keep all follow-up visits as told by your health care provider. This is important. °Where to find more information °National Alliance on Mental Illness: www.nami.org °[place] National Institute of Mental Health: www.nimh.nih.gov °Contact a health care provider if: °Your symptoms get worse. °You develop new symptoms. °Get help right away if: °You self-harm. °You have serious thoughts about hurting yourself or others. °You hallucinate. °If you ever feel like you may hurt yourself or  others, or have thoughts about taking your own life, get help right away. Go to your nearest emergency department or: °Call your local emergency services (911 in the [place] °Call a suicide crisis helpline, such as the National Suicide Prevention Lifeline at 1-800-273-8255 or 988 in the [place] This is open 24 hours a day in the [place] °Text the Crisis Text Line at 741741 (in the [place] °Summary °Major depressive disorder (MDD) is a mental health condition. MDD causes symptoms of sadness, hopelessness, and loss of interest in things. These symptoms last most of the day, almost every day, for 2 weeks. °The symptoms of MDD can interfere with relationships and with everyday activities. °Treatments and support are available for people who develop MDD. You may need more than one type of treatment. °Get help right away if you have serious thoughts about hurting yourself or others. °This information is not intended to replace advice given to you by your health care provider. Make sure you discuss any questions you have with your health care provider. °Document Revised: 03/01/2021 Document Reviewed: 07/18/2019 °Elsevier Patient Education © 2022 Elsevier Inc. ° °

## 2021-09-15 NOTE — Progress Notes (Signed)
Subjective:    Patient ID: [name] Williams, female    DOB: 05-21-1928, 88 y.o.   MRN: [id number]  Chief Complaint  Patient presents with   Medical Management of Chronic Issues   Pt presents to the office today for chronic follow up.   She states her anxiety and depression has worsen. Her son lives in her basement and he is alcoholic. They are currently working on getting him into a nursing home. She reports this is a great deal of stress on her.  She also reports winter months are worse for her.    She has CLL and is followed by Oncologists.  She has not taken any medications in the last several months.  Depression        This is a chronic problem.  The current episode started more than 1 year ago.   The problem occurs intermittently.  Associated symptoms include fatigue, helplessness, hopelessness, insomnia, irritable, restlessness, decreased interest and sad.  Past treatments include nothing.  Compliance with treatment is good.  Past medical history includes anxiety.   Anxiety Presents for follow-up visit. Symptoms include excessive worry, insomnia, irritability, nervous/anxious behavior and restlessness. Symptoms occur most days. The severity of symptoms is moderate.    Insomnia Primary symptoms: difficulty falling asleep, frequent awakening.   The current episode started more than one year. The onset quality is gradual. The problem occurs intermittently. PMH includes: depression.      Review of Systems  Constitutional:  Positive for fatigue and irritability.  Psychiatric/Behavioral:  Positive for depression. The patient is nervous/anxious and has insomnia.   All other systems reviewed and are negative.     Objective:   Physical Exam Vitals reviewed.  Constitutional:      General: She is irritable. She is not in acute distress.    Appearance: She is well-developed.  HENT:     Head: Normocephalic and atraumatic.     Right Ear: Tympanic membrane normal.     Left Ear:  Tympanic membrane normal.  Eyes:     Pupils: Pupils are equal, round, and reactive to light.  Neck:     Thyroid: No thyromegaly.  Cardiovascular:     Rate and Rhythm: Normal rate and regular rhythm.     Heart sounds: Normal heart sounds. No murmur heard. Pulmonary:     Effort: Pulmonary effort is normal. No respiratory distress.     Breath sounds: Normal breath sounds. No wheezing.  Abdominal:     General: Bowel sounds are normal. There is no distension.     Palpations: Abdomen is soft.     Tenderness: There is no abdominal tenderness.  Musculoskeletal:        General: No tenderness. Normal range of motion.     Cervical back: Normal range of motion and neck supple.  Skin:    General: Skin is warm and dry.  Neurological:     Mental Status: She is alert and oriented to person, place, and time.     Cranial Nerves: No cranial nerve deficit.     Deep Tendon Reflexes: Reflexes are normal and symmetric.  Psychiatric:        Behavior: Behavior normal.        Thought Content: Thought content normal.        Judgment: Judgment normal.     BP (!) 116/52    Pulse (!) 41    Temp (!) 97.4 F (36.3 C) (Temporal)    Ht $R'5\' 3"'Pb$  (1.6 m)  Wt 119 lb (54 kg)    BMI 21.08 kg/m       Assessment & Plan:  [name] Williams comes in today with chief complaint of Medical Management of Chronic Issues   Diagnosis and orders addressed:  1. CLL (chronic lymphocytic leukemia) ([facility]) - CMP14+EGFR - CBC with Differential/Platelet  2. Recurrent major depressive disorder, in full remission ([place]) Start Pristiq 50 mg  Stress management   - desvenlafaxine (PRISTIQ) 50 MG 24 hr tablet; Take 1 tablet (50 mg total) by mouth daily.  Dispense: 90 tablet; Refill: 0 - CMP14+EGFR - CBC with Differential/Platelet  3. Insomnia, unspecified type - desvenlafaxine (PRISTIQ) 50 MG 24 hr tablet; Take 1 tablet (50 mg total) by mouth daily.  Dispense: 90 tablet; Refill: 0 - CMP14+EGFR - CBC with  Differential/Platelet  4. GAD (generalized anxiety disorder) - desvenlafaxine (PRISTIQ) 50 MG 24 hr tablet; Take 1 tablet (50 mg total) by mouth daily.  Dispense: 90 tablet; Refill: 0 - CMP14+EGFR - CBC with Differential/Platelet   Labs pending Health Maintenance reviewed Diet and exercise encouraged  Follow up plan: 1 month to follow up with GAD and Depression   Evelina Dun, FNP

## 2021-09-16 LAB — CMP14+EGFR
ALT: 11 IU/L (ref 0–32)
AST: 17 IU/L (ref 0–40)
Albumin/Globulin Ratio: 1.8 (ref 1.2–2.2)
Albumin: 4 g/dL (ref 3.5–4.6)
Alkaline Phosphatase: 85 IU/L (ref 44–121)
BUN/Creatinine Ratio: 9 — ABNORMAL LOW (ref 12–28)
BUN: 12 mg/dL (ref 10–36)
Bilirubin Total: 0.8 mg/dL (ref 0.0–1.2)
CO2: 30 mmol/L — ABNORMAL HIGH (ref 20–29)
Calcium: 9.3 mg/dL (ref 8.7–10.3)
Chloride: 104 mmol/L (ref 96–106)
Creatinine, Ser: 1.27 mg/dL — ABNORMAL HIGH (ref 0.57–1.00)
Globulin, Total: 2.2 g/dL (ref 1.5–4.5)
Glucose: 105 mg/dL — ABNORMAL HIGH (ref 70–99)
Potassium: 5.2 mmol/L (ref 3.5–5.2)
Sodium: 143 mmol/L (ref 134–144)
Total Protein: 6.2 g/dL (ref 6.0–8.5)
eGFR: 40 mL/min/{1.73_m2} — ABNORMAL LOW (ref >59)

## 2021-09-16 LAB — CBC WITH DIFFERENTIAL/PLATELET
Basophils Absolute: 0.1 10*3/uL (ref 0.0–0.2)
Basos: 1 %
EOS (ABSOLUTE): 0.2 10*3/uL (ref 0.0–0.4)
Eos: 1 %
Hematocrit: 44.9 % (ref 34.0–46.6)
Hemoglobin: 14.8 g/dL (ref 11.1–15.9)
Immature Grans (Abs): 0 10*3/uL (ref 0.0–0.1)
Immature Granulocytes: 0 %
Lymphocytes Absolute: 9.3 10*3/uL — ABNORMAL HIGH (ref 0.7–3.1)
Lymphs: 62 %
MCH: 31.1 pg (ref 26.6–33.0)
MCHC: 33 g/dL (ref 31.5–35.7)
MCV: 94 fL (ref 79–97)
Monocytes Absolute: 1.1 10*3/uL — ABNORMAL HIGH (ref 0.1–0.9)
Monocytes: 7 %
Neutrophils Absolute: 4.3 10*3/uL (ref 1.4–7.0)
Neutrophils: 29 %
Platelets: 155 10*3/uL (ref 150–450)
RBC: 4.76 x10E6/uL (ref 3.77–5.28)
RDW: 12.6 % (ref 11.7–15.4)
WBC: 15.1 10*3/uL — ABNORMAL HIGH (ref 3.4–10.8)

## 2021-09-25 ENCOUNTER — Ambulatory Visit (INDEPENDENT_AMBULATORY_CARE_PROVIDER_SITE_OTHER): Payer: Medicare Other

## 2021-09-25 DIAGNOSIS — F3342 Major depressive disorder, recurrent, in full remission: Secondary | ICD-10-CM

## 2021-09-25 DIAGNOSIS — G4700 Insomnia, unspecified: Secondary | ICD-10-CM

## 2021-09-25 DIAGNOSIS — I10 Essential (primary) hypertension: Secondary | ICD-10-CM

## 2021-09-25 DIAGNOSIS — I639 Cerebral infarction, unspecified: Secondary | ICD-10-CM

## 2021-09-25 DIAGNOSIS — E785 Hyperlipidemia, unspecified: Secondary | ICD-10-CM

## 2021-09-25 DIAGNOSIS — K219 Gastro-esophageal reflux disease without esophagitis: Secondary | ICD-10-CM

## 2021-09-25 DIAGNOSIS — F411 Generalized anxiety disorder: Secondary | ICD-10-CM

## 2021-09-25 NOTE — Chronic Care Management (AMB) (Signed)
Chronic Care Management    Clinical Social Work Note  09/25/2021 Name: [name] Williams MRN: [id number] DOB: [birth year]  [name] Williams is a 88 y.o. year old female who is a primary care patient of Sharion Balloon, FNP. The CCM team was consulted to assist the patient with chronic disease management and/or care coordination needs related to: [employer] .   Engaged with patient by telephone for follow up visit in response to provider referral for social work chronic care management and care coordination services.   Consent to Services:  The patient was given information about Chronic Care Management services, agreed to services, and gave verbal consent prior to initiation of services.  Please see initial visit note for detailed documentation.   Patient agreed to services and consent obtained.   Assessment: Review of patient past medical history, allergies, medications, and health status, including review of relevant consultants reports was performed today as part of a comprehensive evaluation and provision of chronic care management and care coordination services.     SDOH (Social Determinants of Health) assessments and interventions performed:  SDOH Interventions    Flowsheet Row Most Recent Value  SDOH Interventions   Physical Activity Interventions Other (Comments)  [client is at risk for falls. She is not using a device at present to help her walk in the home.  Stairs from one level of home to her basement. Risk for fall]  Stress Interventions Provide Counseling  [client has stress related to managing medical needs]  Depression Interventions/Treatment  Medication, Counseling        Advanced Directives Status: See Vynca application for related entries.  CCM Care Plan  Allergies  Allergen Reactions   Latex     Per pt 11-10-14 she do not remember being allergic to this.../or    Outpatient Encounter Medications as of 09/25/2021  Medication Sig   desvenlafaxine (PRISTIQ) 50 MG  24 hr tablet Take 1 tablet (50 mg total) by mouth daily.   ergocalciferol (VITAMIN D2) 1.25 MG (50000 UT) capsule Take 1 capsule (50,000 Units total) by mouth once a week. (Patient not taking: Reported on 09/15/2021)   No facility-administered encounter medications on file as of 09/25/2021.    Patient Active Problem List   Diagnosis Date Noted   CLL (chronic lymphocytic leukemia) ([place]) 04/07/2019   Leukocytosis 03/12/2019   Insomnia 01/07/2015   Osteopenia 01/07/2015   GAD (generalized anxiety disorder) 11/24/2014   Dermatitis, purulent 04/08/2014   Memory loss 01/28/2014   Fatigue 01/07/2014   Cerebral vascular accident ([place]) 12/03/2013   BRADYCARDIA 03/15/2010   SYNCOPE AND COLLAPSE 03/15/2010   Depression 09/14/2009   Transient cerebral ischemia 09/14/2009   GERD 09/14/2009   Hyperlipidemia 07/14/2008   HYPERTENSION, BENIGN 07/14/2008   Hypertrophic obstructive cardiomyopathy(425.11) 07/14/2008   Hypertrophic obstructive cardiomyopathy ([place]) 07/14/2008    Conditions to be addressed/monitored: Monitor client management of anxiety and stress issues faced  Care Plan : LCSW Care Plan  Updates made by Katha Cabal, LCSW since 09/25/2021 12:00 AM     Problem: Anxiety Identification (Anxiety)      Goal: Manage Anxiety and Depression issues faced. Manage mobility needs   Start Date: 09/25/2021  Expected End Date: 12/21/2021  This Visit's Progress: On track  Recent Progress: On track  Priority: Medium  Note:   Current Barriers:  Chronic Mental Health needs related to anxiety and stress management Decreased appetite Mobility issues; risk for falls Stress issues with son who Williams in basement Suicidal Ideation/Homicidal Ideation: No  Clinical Social Work Goal(s):  patient will work with SW monthly by telephone or in person to reduce or manage symptoms related to anxiety and stress issues faced patient will work with SW in next 30 days to address concerns related to mobilty  challenges Patient will communicate with RNCM in next 30 days to discuss nursing needs of client Client will attend scheduled medical appointments in next 30 days  Interventions: 1:1 collaboration with Sharion Balloon, FNP regarding development and update of comprehensive plan of care as evidenced by provider attestation and co-signature Discussed client needs with [name] Williams. Discussed sleeping issues of client. Client has diagnosis of insomnia. [name] said she has difficulty sleeping. It is hard for her to rest at night. Reviewed mood of client. [name] said she was recently prescribed medication, Pristiq. She said that medication prescribed is helping her.  Today on phone call, client seemed more upbeat, seemed in positive mood, was communicative with LCWW.  [name] said she had put more guidelines on her son, who Williams in basement of [name]'s home. Son in basement is paying occasionally for gas expense and helping [name] occasionally with food expenses. [name] said that her son who Williams in her basement gets Disability benefit each month.  LCSW congratulated  [name] on setting these guidelines for her son who Williams with her. Such guidelines are helpful to her son, helpful to client and can help client financially as well. Discussed client support from her daughter, [name] Williams, and from her son, [name] Williams. Encouraged [name] to call RNCM as needed for CCM nursing support. Discussed appetite of client. [name] said she has reduce appetite. She said she has started buying protein drinks and protein bars to use to help with her nutrition. Reviewed mobility of [name] Williams.  She said she walks in side her home without use of a device.  Client may be at risk for falls Provided counseling support for client  Patient Self Care Activities:  Attends all scheduled provider appointments  Patient Coping Strengths:  Family is supportive Eats meals with set up assistance Allows time to rest and  relax  Patient Self Care Deficits:  Decreased appetite Memory issues Risk of falls   Patient Goals:  - spend time or talk with others every day - practice relaxation or meditation daily - keep a calendar with appointment dates  Follow Up Plan: LCSW will call client or her daughter, [name] Williams, on 11/16/21 at 1:00 PM to assess client needs at that time.     [name].Elbie Statzer MSW, [place] [employer] representative [facility] Care Management [phone]

## 2021-09-25 NOTE — Patient Instructions (Addendum)
Visit Information  Patient Goals: Manage My Emotions (Patient). Manage Anxiety and stress issues faced  Timeframe:  Short-Term Goal Priority:  Medium Progress: On Track Start Date:       09/25/21                 Expected End Date:   12/21/21                   Follow Up Date    11/16/21 at 1:00 PM  Manage My Emotions (Patient). Manage anxiety and stress issues faced    Why is this important?   When you are stressed, down or upset, your body reacts too.  For example, your blood pressure may get higher; you may have a headache or stomachache.  When your emotions get the best of you, your body's ability to fight off cold and flu gets weak.  These steps will help you manage your emotions.     Patient Self Care Activities:  Attends all scheduled provider appointments  Patient Coping Strengths:  Family is supportive Eats meals with set up assistance Allows time to rest and relax  Patient Self Care Deficits:  Decreased appetite  Patient Goals:  - spend time or talk with others every day - practice relaxation or meditation daily - keep a calendar with appointment dates  Follow Up Plan: LCSW will call client or her daughter, Mariann Laster on 11/16/21 at 1:00 PM to assess client needs   [name].Maciah Feeback MSW, [place] [facility] representative [facility] Care Management [phone]

## 2021-11-03 ENCOUNTER — Encounter: Payer: Self-pay | Admitting: Family

## 2021-11-03 ENCOUNTER — Ambulatory Visit (INDEPENDENT_AMBULATORY_CARE_PROVIDER_SITE_OTHER): Payer: Medicare Other | Admitting: Family

## 2021-11-03 VITALS — BP 114/73 | HR 77 | Temp 97.0°F | Ht 63.0 in | Wt 116.6 lb

## 2021-11-03 DIAGNOSIS — R5383 Other fatigue: Secondary | ICD-10-CM

## 2021-11-03 DIAGNOSIS — I10 Essential (primary) hypertension: Secondary | ICD-10-CM | POA: Diagnosis not present

## 2021-11-03 DIAGNOSIS — F3342 Major depressive disorder, recurrent, in full remission: Secondary | ICD-10-CM

## 2021-11-03 DIAGNOSIS — G4700 Insomnia, unspecified: Secondary | ICD-10-CM | POA: Diagnosis not present

## 2021-11-03 DIAGNOSIS — K219 Gastro-esophageal reflux disease without esophagitis: Secondary | ICD-10-CM

## 2021-11-03 DIAGNOSIS — C9110 Chronic lymphocytic leukemia of B-cell type not having achieved remission: Secondary | ICD-10-CM

## 2021-11-03 DIAGNOSIS — F411 Generalized anxiety disorder: Secondary | ICD-10-CM

## 2021-11-03 NOTE — Progress Notes (Signed)
? ?Subjective:  ? ? Patient ID: [name] Williams, female    DOB: 09-Jan-1928, 88 y.o.   MRN: [id number] ? ?Chief Complaint  ?Patient presents with  ? Medical Management of Chronic Issues  ? ?[name] Williams presents to the office today to recheck GAD and depression. [name] Williams 6 weeks ago. [name] Williams [name] Williams took "half of it", but just stopped because [name] Williams does not like taking medications.  ?  ?[name] Williams states [name] Williams anxiety and depression are good at this time.  [name] Williams [name] Williams does lives in [name] Williams basement and he is alcoholic. [name] Williams are currently working on getting him into a nursing home. [name] Williams this is a great deal of stress on [name] Williams.  [name] Williams winter months are worse for [name] Williams.  ?  ?[name] Williams and is followed by Oncologists. ?Anxiety ?Presents for follow-up visit. Symptoms include depressed mood, excessive worry, insomnia, irritability, nervous/anxious behavior and restlessness. Symptoms occur occasionally. The severity of symptoms is moderate.  ? ? ?Depression ?       This is a chronic problem.  The current episode started more than 1 year ago.   The problem occurs intermittently.  Associated symptoms include fatigue, helplessness, hopelessness, insomnia, restlessness and sad.  Past treatments include nothing.  Past medical history includes anxiety.   ?Insomnia ?Primary symptoms: difficulty falling asleep, malaise/fatigue.   ?The current episode started more than one year. The onset quality is gradual. The problem occurs intermittently. PMH includes: depression.   ?Gastroesophageal Reflux ?[name] Williams complains of belching and heartburn. This is a chronic problem. The current episode started more than 1 year ago. The problem occurs occasionally. Associated symptoms include fatigue. [name] Williams has tried a PPI for the symptoms. The treatment provided moderate relief.  ? ? ? ?Review of Systems  ?Constitutional:  Positive for fatigue, irritability and malaise/fatigue.  ?Gastrointestinal:  Positive for heartburn.  ?Psychiatric/Behavioral:  Positive for  depression. The patient is nervous/anxious and has insomnia.   ?All other systems reviewed and are negative. ? ?   ?Objective:  ? Physical Exam ?Vitals reviewed.  ?Constitutional:   ?   General: [name] Williams is not in acute distress. ?   Appearance: [name] Williams is well-developed.  ?HENT:  ?   Head: Normocephalic and atraumatic.  ?   Right Ear: Tympanic membrane normal.  ?   Left Ear: Tympanic membrane normal.  ?Eyes:  ?   Pupils: Pupils are equal, round, and reactive to light.  ?Neck:  ?   Thyroid: No thyromegaly.  ?Cardiovascular:  ?   Rate and Rhythm: Normal rate and regular rhythm.  ?   Heart sounds: Normal heart sounds. No murmur heard. ?Pulmonary:  ?   Effort: Pulmonary effort is normal. No respiratory distress.  ?   Breath sounds: Normal breath sounds. No wheezing.  ?Abdominal:  ?   General: Bowel sounds are normal. There is no distension.  ?   Palpations: Abdomen is soft.  ?   Tenderness: There is no abdominal tenderness.  ?Musculoskeletal:     ?   General: No tenderness. Normal range of motion.  ?   Cervical back: Normal range of motion and neck supple.  ?Skin: ?   General: Skin is warm and dry.  ?Neurological:  ?   Mental Status: [name] Williams is alert and oriented to person, place, and time.  ?   Cranial Nerves: No cranial nerve deficit.  ?   Motor: Weakness present.  ?   Deep Tendon Reflexes: Reflexes are normal and symmetric.  ?Psychiatric:     ?  Behavior: Behavior normal.     ?   Thought Content: Thought content normal.     ?   Judgment: Judgment normal.  ? ? ? ? ? ?  BP 114/73   Pulse 77   Temp (!) 97 ?F (36.1 ?C) (Temporal)   Ht '5\' 3"'$  (1.6 m)   Wt 116 lb 9.6 oz (52.9 kg)   BMI 20.65 kg/m?  ? ?Assessment & Plan:  ?Zelta Enfield Berlanga comes in today with chief complaint of Medical Management of Chronic Issues ? ? ?Diagnosis and orders addressed: ? ?1. HYPERTENSION, BENIGN ? ?2. Gastroesophageal reflux disease, unspecified whether esophagitis present ? ?3. Recurrent major depressive disorder, in full remission ([place]) ? ?4. Williams  (chronic lymphocytic leukemia) ([place]) ? ?5. Other fatigue ? ?6. Insomnia, unspecified type ? ?7. GAD (generalized anxiety disorder) ? ? ?Health Maintenance reviewed ?Diet and exercise encouraged ? ?Follow up plan: ?3 months and keep appt with Oncologists  ? ?Evelina Dun, FNP ? ? ?

## 2021-11-03 NOTE — Patient Instructions (Signed)

## 2021-11-16 ENCOUNTER — Telehealth: Payer: Self-pay

## 2021-11-16 ENCOUNTER — Ambulatory Visit (INDEPENDENT_AMBULATORY_CARE_PROVIDER_SITE_OTHER): Payer: Medicare Other | Admitting: Licensed Clinical Social Worker

## 2021-11-16 DIAGNOSIS — K219 Gastro-esophageal reflux disease without esophagitis: Secondary | ICD-10-CM

## 2021-11-16 DIAGNOSIS — F411 Generalized anxiety disorder: Secondary | ICD-10-CM

## 2021-11-16 DIAGNOSIS — I639 Cerebral infarction, unspecified: Secondary | ICD-10-CM

## 2021-11-16 DIAGNOSIS — G4700 Insomnia, unspecified: Secondary | ICD-10-CM

## 2021-11-16 DIAGNOSIS — I10 Essential (primary) hypertension: Secondary | ICD-10-CM

## 2021-11-16 DIAGNOSIS — E785 Hyperlipidemia, unspecified: Secondary | ICD-10-CM

## 2021-11-16 DIAGNOSIS — F3342 Major depressive disorder, recurrent, in full remission: Secondary | ICD-10-CM

## 2021-11-16 MED ORDER — MIRTAZAPINE 7.5 MG PO TABS: 7.5000 mg | ORAL_TABLET | Freq: Every day | ORAL | 1 refills | Status: DC

## 2021-11-16 NOTE — Patient Instructions (Addendum)
Visit Information ? ?Patient Goals: Manage My Emotions (Patient).  Manage anxiety and stress issues faced ? ?Timeframe:  Short-Term Goal ?Priority:  Medium ?Progress: On Track ?Start Date:       11/16/21                   ?Expected End Date:   02/13/22                  ? ?Follow Up Date    01/08/22 at 3:00 PM ? ?Manage My Emotions (Patient). Manage anxiety and stress issues faced  ?  ?Why is this important?   ?When you are stressed, down or upset, your body reacts too.  ?For example, your blood pressure may get higher; you may have a headache or stomachache.  ?When your emotions get the best of you, your body's ability to fight off cold and flu gets weak.  ?These steps will help you manage your emotions.   ?  ?Patient Self Care Activities:  ?Attends all scheduled provider appointments ? ?Patient Coping Strengths:  ?Family is supportive ?Eats meals with set up assistance ?Allows time to rest and relax ? ?Patient Self Care Deficits:  ?Decreased appetite ? ?Patient Goals:  ?- spend time or talk with others every day ?- practice relaxation or meditation daily ?- keep a calendar with appointment dates ? ?Follow Up Plan: LCSW will call client or her daughter, Mariann Laster on 01/08/22  at 3:00 PM to assess client needs  ? ?[name].Ralphael Southgate MSW, LCSW ?Licensed Clinical Social Worker ?[place] Management ?[phone]  ?

## 2021-11-16 NOTE — Telephone Encounter (Signed)
Lmtcb.

## 2021-11-16 NOTE — Telephone Encounter (Signed)
Remeron 7.5 mg Prescription sent to pharmacy. Take at bed time.  ? ?Evelina Dun, FNP ? ?

## 2021-11-16 NOTE — Chronic Care Management (AMB) (Signed)
?Chronic Care Management  ? ? Clinical Social Work Note ? ?11/16/2021 ?Name: [name] Williams MRN: [id number] DOB: Jun 03, 1928 ? ?[name] Williams is a 88 y.o. year old female who is a primary care patient of Sharion Balloon, FNP. The CCM team was consulted to assist the patient with chronic disease management and/or care coordination needs related to: [employer] .  ? ?Engaged with patient by telephone for follow up visit in response to provider referral for social work chronic care management and care coordination services.  ? ?Consent to Services:  ?The patient was given information about Chronic Care Management services, agreed to services, and gave verbal consent prior to initiation of services.  Please see initial visit note for detailed documentation.  ? ?Patient agreed to services and consent obtained.  ? ?Assessment: Review of patient past medical history, allergies, medications, and health status, including review of relevant consultants reports was performed today as part of a comprehensive evaluation and provision of chronic care management and care coordination services.    ? ?SDOH (Social Determinants of Health) assessments and interventions performed:  ?SDOH Interventions   ? ?Flowsheet Row Most Recent Value  ?SDOH Interventions   ?Physical Activity Interventions Other (Comments)  [client is at risk for falls]  ?Stress Interventions Provide Counseling  [client has stress related to decreased sleep. client has stress related to decreased appetite]  ? ?  ?  ? ?Advanced Directives Status: See Vynca application for related entries. ? ?CCM Care Plan ? ?Allergies  ?Allergen Reactions  ? Latex   ?  Per pt 11-10-14 she do not remember being allergic to this.../or  ? ? ?Outpatient Encounter Medications as of 11/16/2021  ?Medication Sig  ? desvenlafaxine (PRISTIQ) 50 MG 24 hr tablet Take 1 tablet (50 mg total) by mouth daily.  ? ergocalciferol (VITAMIN D2) 1.25 MG (50000 UT) capsule Take 1 capsule (50,000 Units  total) by mouth once a week. (Patient not taking: Reported on 09/15/2021)  ? mirtazapine (REMERON) 7.5 MG tablet Take 1 tablet (7.5 mg total) by mouth at bedtime.  ? ?No facility-administered encounter medications on file as of 11/16/2021.  ? ? ?Patient Active Problem List  ? Diagnosis Date Noted  ? CLL (chronic lymphocytic leukemia) ([place]) 04/07/2019  ? Leukocytosis 03/12/2019  ? Insomnia 01/07/2015  ? Osteopenia 01/07/2015  ? GAD (generalized anxiety disorder) 11/24/2014  ? Dermatitis, purulent 04/08/2014  ? Memory loss 01/28/2014  ? Fatigue 01/07/2014  ? Cerebral vascular accident ([place]) 12/03/2013  ? BRADYCARDIA 03/15/2010  ? SYNCOPE AND COLLAPSE 03/15/2010  ? Depression 09/14/2009  ? Transient cerebral ischemia 09/14/2009  ? GERD 09/14/2009  ? Hyperlipidemia 07/14/2008  ? HYPERTENSION, BENIGN 07/14/2008  ? Hypertrophic obstructive cardiomyopathy(425.11) 07/14/2008  ? Hypertrophic obstructive cardiomyopathy ([place]) 07/14/2008  ? ? ?Conditions to be addressed/monitored: monitor client management of anxiety issues and stress issues ? ?Care Plan : LCSW Care Plan  ?Updates made by Katha Cabal, LCSW since 11/16/2021 12:00 AM  ?  ? ?Problem: Anxiety Identification (Anxiety)   ?  ? ?Goal: Manage Anxiety and Depression issues faced. Manage mobility needs   ?Start Date: 11/16/2021  ?Expected End Date: 02/13/2022  ?This Visit's Progress: On track  ?Recent Progress: On track  ?Priority: Medium  ?Note:   ?Current Barriers:  ?Chronic Mental Health needs related to anxiety and stress management ?Decreased appetite ?Mobility issues; risk for falls ?Stress issues with son who resides in basement ?Suicidal Ideation/Homicidal Ideation: No ? ?Clinical Social Work Goal(s):  ?patient will work with SW  monthly by telephone or in person to reduce or manage symptoms related to anxiety and stress issues faced ?patient will work with SW in next 30 days to address concerns related to mobilty challenges ?Patient will communicate with RNCM in  next 30 days to discuss nursing needs of client ?Client will attend scheduled medical appointments in next 30 days ? ?Interventions: ?1:1 collaboration with Sharion Balloon, FNP regarding development and update of comprehensive plan of care as evidenced by provider attestation and co-signature ?Discussed client needs with [name] Williams. ?Discussed sleeping issues of client. Client has diagnosis of insomnia. [name] said she has difficulty sleeping. It is hard for her to rest at night. ?Reviewed mood of client. [name] said she was recently prescribed medication, Pristiq. She said that medication prescribed is helping her. She receives support from one of her sons and from her daughter. ?Encouraged [name] to call RNCM as needed for CCM nursing support. ?Discussed appetite of client. [name] said she has reduced appetite. She said: " I have zero appetite." She said she drinks one can of Ensure in the AM; but, she said she just has no appetite. She said she thinks she has lost about 15 pounds in 2-3 month period. She is concerned about her weight loss, concerned about her decreased appetite.  She said she has appointment next Monday with Evelina Dun, FNP at [facility].  LCSW also called [facility] Triage nurse today and left phone message for Triage Nurse informing nurse of client decreased appetite and client concern over weight loss.   ?Reviewed mobility of [name] Williams.  She said she walks in side her home without use of a device.  Client may be at risk for falls. She said she was fearful that she might fall ?Provided counseling support for client ?Reviewed pain issues of client.   ?Client said she has reduced energy.  She said she fatigues often.  Her son resides in basement of her home. She feels that her son staying in her home is causing client stress. She said her son is alcoholic.  She and LCSW talked of son's help with costs of living. [name] did say that son helps with grocery costs each month.She said son sometimes  helps with gas costs for car. ?Encouraged [name] to talk with Evelina Dun FNP about client's appetite issues, weight loss issues, and sleeping challenges. ? ?Patient Self Care Activities:  ?Attends all scheduled provider appointments ? ?Patient Coping Strengths:  ?Family is supportive ?Eats meals with set up assistance ?Allows time to rest and relax ? ?Patient Self Care Deficits:  ?Decreased appetite ?Memory issues ?Risk of falls  ? ?Patient Goals:  ?- spend time or talk with others every day ?- practice relaxation or meditation daily ?- keep a calendar with appointment dates ? ?Follow Up Plan: LCSW will call client or her daughter, [name] Williams, on 01/08/22 at 3:00 PM to assess client needs at that time.   ?  ?[name].Tarrin Menn MSW, LCSW ?Licensed Clinical Social Worker ?[place] [facility] ?[phone] ? ?

## 2021-11-17 DIAGNOSIS — F3342 Major depressive disorder, recurrent, in full remission: Secondary | ICD-10-CM

## 2021-11-17 DIAGNOSIS — I639 Cerebral infarction, unspecified: Secondary | ICD-10-CM

## 2021-11-17 DIAGNOSIS — I10 Essential (primary) hypertension: Secondary | ICD-10-CM

## 2021-11-17 DIAGNOSIS — E785 Hyperlipidemia, unspecified: Secondary | ICD-10-CM

## 2021-11-17 NOTE — Telephone Encounter (Signed)
Patient notified and verbalized understanding. 

## 2021-11-20 ENCOUNTER — Ambulatory Visit: Payer: Medicare Other | Admitting: Family

## 2021-11-23 ENCOUNTER — Encounter: Payer: Self-pay | Admitting: Family

## 2021-11-23 ENCOUNTER — Ambulatory Visit (INDEPENDENT_AMBULATORY_CARE_PROVIDER_SITE_OTHER): Payer: Medicare Other | Admitting: Family

## 2021-11-23 VITALS — BP 119/74 | HR 66 | Temp 97.4°F | Ht 63.0 in | Wt 115.2 lb

## 2021-11-23 DIAGNOSIS — R630 Anorexia: Secondary | ICD-10-CM | POA: Diagnosis not present

## 2021-11-23 DIAGNOSIS — R5383 Other fatigue: Secondary | ICD-10-CM | POA: Diagnosis not present

## 2021-11-23 DIAGNOSIS — R6889 Other general symptoms and signs: Secondary | ICD-10-CM | POA: Diagnosis not present

## 2021-11-23 DIAGNOSIS — R2689 Other abnormalities of gait and mobility: Secondary | ICD-10-CM | POA: Diagnosis not present

## 2021-11-23 NOTE — Progress Notes (Signed)
? ?  Subjective:  ? ? Patient ID: [name] Williams, female    DOB: 19-Feb-1928, 88 y.o.   MRN: [id number] ? ?Chief Complaint  ?Patient presents with  ? Follow-up  ?  Head just dont feel right she started a new med last week   ? ?Pt presents to the office today with "my head not feeling good". Reports her balance is off and she fell yesterday in the bathroom.  ? ?She was started on Remeron 7.5 mg last week for depression and to increase appetite. She did not take it yesterday and states her balance seems the same today.  ?Fall ?The accident occurred 12 to 24 hours ago. She landed on Hard floor. There was no blood loss. The point of impact was the right elbow.  ? ? ? ?Review of Systems  ?All other systems reviewed and are negative. ? ?   ?Objective:  ? Physical Exam ?Vitals reviewed.  ?Constitutional:   ?   General: She is not in acute distress. ?   Appearance: She is well-developed.  ?HENT:  ?   Head: Normocephalic and atraumatic.  ?Eyes:  ?   Pupils: Pupils are equal, round, and reactive to light.  ?Neck:  ?   Thyroid: No thyromegaly.  ?Cardiovascular:  ?   Rate and Rhythm: Normal rate and regular rhythm.  ?   Heart sounds: Normal heart sounds. No murmur heard. ?Pulmonary:  ?   Effort: Pulmonary effort is normal. No respiratory distress.  ?   Breath sounds: Normal breath sounds. No wheezing.  ?Abdominal:  ?   General: Bowel sounds are normal. There is no distension.  ?   Palpations: Abdomen is soft.  ?   Tenderness: There is no abdominal tenderness.  ?Musculoskeletal:     ?   General: No tenderness. Normal range of motion.  ?   Cervical back: Normal range of motion and neck supple.  ?Skin: ?   General: Skin is warm and dry.  ?Neurological:  ?   Mental Status: She is alert and oriented to person, place, and time.  ?   Cranial Nerves: No cranial nerve deficit.  ?   Motor: Weakness present.  ?   Deep Tendon Reflexes: Reflexes are normal and symmetric.  ?Psychiatric:     ?   Behavior: Behavior normal.     ?   Thought Content:  Thought content normal.     ?   Judgment: Judgment normal.  ? ? ? ?BP 119/74   Pulse 66   Temp (!) 97.4 ?F (36.3 ?C) (Temporal)   Ht _0  (1.6 m)   Wt 115 lb 3.2 oz (52.3 kg)   BMI 20.41 kg/m?  ? ? ?   ?Assessment & Plan:  ?[name] Williams comes in today with chief complaint of Follow-up (Head just dont feel right she started a new med last week ) ? ? ?Diagnosis and orders addressed: ? ?1. Other fatigue ?- CMP14+EGFR ?- Anemia Profile B ? ?2. Balance problem ? ?- CMP14+EGFR ?- Anemia Profile B ? ?3. Decreased appetite ?- CMP14+EGFR ?- Anemia Profile B ? ?Will hold Remeron 7.5 mg until Monday to see if symptoms resolved.  ?Discussed she should not wear heels while feeling dizzy. Neuro exam normal.  ?Labs pending ?Health Maintenance reviewed ?Diet and exercise encouraged ? ? ?Evelina Dun, FNP ? ? ?

## 2021-11-23 NOTE — Progress Notes (Deleted)
? ?  Subjective:  ? ? Patient ID: [name] Williams, female    DOB: 06/28/1928, 87 y.o.   MRN: [id number] ? ?Chief Complaint  ?Patient presents with  ? Follow-up  ?  Head just dont feel right she started a new med last week   ? ?Pt presents to the office today with "my head not feeling good". Reports her balance is off and she fell yesterday in the bathroom.  ? ?She was started on Remeron 7.5 mg last week for depression and to increase appetite. She did not take it yesterday and states her balance seems the same today.  ?Fall ?The accident occurred 12 to 24 hours ago. She landed on Hard floor. There was no blood loss. Point of impact: right elbow. The pain is mild.  ? ? ? ?Review of Systems ? ?   ?Objective:  ? Physical Exam ? ? ? ? ?   ?Assessment & Plan:  ? ? ?

## 2021-11-23 NOTE — Patient Instructions (Signed)
Fall Prevention in the Home, Adult ?Falls can cause injuries and affect people of all ages. There are many simple things that you can do to make your home safe and to help prevent falls. Ask for help when making these changes, if needed. ?What actions can I take to prevent falls? ?General instructions ?Use good lighting in all rooms. Replace any light bulbs that burn out, turn on lights if it is dark, and use night-lights. ?Place frequently used items in easy-to-reach places. Lower the shelves around your home if necessary. ?Set up furniture so that there are clear paths around it. Avoid moving your furniture around. ?Remove throw rugs and other tripping hazards from the floor. ?Avoid walking on wet floors. ?Fix any uneven floor surfaces. ?Add color or contrast paint or tape to grab bars and handrails in your home. Place contrasting color strips on the first and last steps of staircases. ?When you use a stepladder, make sure that it is completely opened and that the sides and supports are firmly locked. Have someone hold the ladder while you are using it. Do not climb a closed stepladder. ?Know where your pets are when moving through your home. ?What can I do in the bathroom? ?  ?Keep the floor dry. Immediately clean up any water that is on the floor. ?Remove soap buildup in the tub or shower regularly. ?Use nonskid mats or decals on the floor of the tub or shower. ?Attach bath mats securely with double-sided, nonslip rug tape. ?If you need to sit down while you are in the shower, use a plastic, nonslip stool. ?Install grab bars by the toilet and in the tub and shower. Do not use towel bars as grab bars. ?What can I do in the bedroom? ?Make sure that a bedside light is easy to reach. ?Do not use oversized bedding that reaches the floor. ?Have a firm chair that has side arms to use for getting dressed. ?What can I do in the kitchen? ?Clean up any spills right away. ?If you need to reach for something above you, use a  sturdy step stool that has a grab bar. ?Keep electrical cables out of the way. ?Do not use floor polish or wax that makes floors slippery. If you must use wax, make sure that it is non-skid floor wax. ?What can I do with my stairs? ?Do not leave any items on the stairs. ?Make sure that you have a light switch at the top and the bottom of the stairs. Have them installed if you do not have them. ?Make sure that there are handrails on both sides of the stairs. Fix handrails that are broken or loose. Make sure that handrails are as long as the staircases. ?Install non-slip stair treads on all stairs in your home. ?Avoid having throw rugs at the top or bottom of stairs, or secure the rugs with carpet tape to prevent them from moving. ?Choose a carpet design that does not hide the edge of steps on the stairs. ?Check any carpeting to make sure that it is firmly attached to the stairs. Fix any carpet that is loose or worn. ?What can I do on the outside of my home? ?Use bright outdoor lighting. ?Regularly repair the edges of walkways and driveways and fix any cracks. ?Remove high doorway thresholds. ?Trim any shrubbery on the main path into your home. ?Regularly check that handrails are securely fastened and in good repair. Both sides of all steps should have handrails. ?Install guardrails along the edges  of any raised decks or porches. ?Clear walkways of debris and clutter, including tools and rocks. ?Have leaves, snow, and ice cleared regularly. ?Use sand or salt on walkways during winter months. ?In the garage, clean up any spills right away, including grease or oil spills. ?What other actions can I take? ?Wear closed-toe shoes that fit well and support your feet. Wear shoes that have rubber soles or low heels. ?Use mobility aids as needed, such as canes, walkers, scooters, and crutches. ?Review your medicines with your health care provider. Some medicines can cause dizziness or changes in blood pressure, which increase  your risk of falling. ?Talk with your health care provider about other ways that you can decrease your risk of falls. This may include working with a physical therapist or trainer to improve your strength, balance, and endurance. ?Where to find more information ?Centers for Disease Control and Prevention, [employer]: http://www.wolf.info/ ?[employer] on Aging: http://kim-miller.com/ ?Contact a health care provider if: ?You are afraid of falling at home. ?You feel weak, drowsy, or dizzy at home. ?You fall at home. ?Summary ?There are many simple things that you can do to make your home safe and to help prevent falls. ?Ways to make your home safe include removing tripping hazards and installing grab bars in the bathroom. ?Ask for help when making these changes in your home. ?This information is not intended to replace advice given to you by your health care provider. Make sure you discuss any questions you have with your health care provider. ?Document Revised: 03/09/2020 Document Reviewed: 03/09/2020 ?Elsevier Patient Education ? [place]. ? ?

## 2021-11-24 LAB — ANEMIA PROFILE B
Basophils Absolute: 0.1 10*3/uL (ref 0.0–0.2)
Basos: 1 %
EOS (ABSOLUTE): 0.1 10*3/uL (ref 0.0–0.4)
Eos: 1 %
Ferritin: 152 ng/mL — ABNORMAL HIGH (ref 15–150)
Folate: 11.1 ng/mL (ref >3.0)
Hematocrit: 42.9 % (ref 34.0–46.6)
Hemoglobin: 14.3 g/dL (ref 11.1–15.9)
Immature Grans (Abs): 0 10*3/uL (ref 0.0–0.1)
Immature Granulocytes: 0 %
Iron Saturation: 28 % (ref 15–55)
Iron: 64 ug/dL (ref 27–139)
Lymphocytes Absolute: 9 10*3/uL — ABNORMAL HIGH (ref 0.7–3.1)
Lymphs: 60 %
MCH: 30.6 pg (ref 26.6–33.0)
MCHC: 33.3 g/dL (ref 31.5–35.7)
MCV: 92 fL (ref 79–97)
Monocytes Absolute: 1 10*3/uL — ABNORMAL HIGH (ref 0.1–0.9)
Monocytes: 7 %
Neutrophils Absolute: 4.7 10*3/uL (ref 1.4–7.0)
Neutrophils: 31 %
Platelets: 177 10*3/uL (ref 150–450)
RBC: 4.68 x10E6/uL (ref 3.77–5.28)
RDW: 11.9 % (ref 11.7–15.4)
Retic Ct Pct: 0.7 % (ref 0.6–2.6)
Total Iron Binding Capacity: 229 ug/dL — ABNORMAL LOW (ref 250–450)
UIBC: 165 ug/dL (ref 118–369)
Vitamin B-12: >2000 pg/mL — ABNORMAL HIGH (ref 232–1245)
WBC: 15 10*3/uL — ABNORMAL HIGH (ref 3.4–10.8)

## 2021-11-24 LAB — CMP14+EGFR
ALT: 10 IU/L (ref 0–32)
AST: 20 IU/L (ref 0–40)
Albumin/Globulin Ratio: 2.2 (ref 1.2–2.2)
Albumin: 3.9 g/dL (ref 3.5–4.6)
Alkaline Phosphatase: 98 IU/L (ref 44–121)
BUN/Creatinine Ratio: 13 (ref 12–28)
BUN: 14 mg/dL (ref 10–36)
Bilirubin Total: 0.6 mg/dL (ref 0.0–1.2)
CO2: 29 mmol/L (ref 20–29)
Calcium: 9.3 mg/dL (ref 8.7–10.3)
Chloride: 100 mmol/L (ref 96–106)
Creatinine, Ser: 1.12 mg/dL — ABNORMAL HIGH (ref 0.57–1.00)
Globulin, Total: 1.8 g/dL (ref 1.5–4.5)
Glucose: 100 mg/dL — ABNORMAL HIGH (ref 70–99)
Potassium: 4.6 mmol/L (ref 3.5–5.2)
Sodium: 139 mmol/L (ref 134–144)
Total Protein: 5.7 g/dL — ABNORMAL LOW (ref 6.0–8.5)
eGFR: 46 mL/min/{1.73_m2} — ABNORMAL LOW (ref >59)

## 2021-12-01 NOTE — Progress Notes (Deleted)
NO SHOW

## 2021-12-04 ENCOUNTER — Ambulatory Visit (HOSPITAL_COMMUNITY): Payer: Medicare Other | Admitting: Physician Assistant

## 2021-12-04 ENCOUNTER — Inpatient Hospital Stay (HOSPITAL_COMMUNITY): Payer: Medicare Other | Attending: Hematology

## 2021-12-14 ENCOUNTER — Other Ambulatory Visit: Payer: Self-pay | Admitting: Family

## 2021-12-14 DIAGNOSIS — F411 Generalized anxiety disorder: Secondary | ICD-10-CM

## 2021-12-14 DIAGNOSIS — F3342 Major depressive disorder, recurrent, in full remission: Secondary | ICD-10-CM

## 2021-12-14 DIAGNOSIS — G4700 Insomnia, unspecified: Secondary | ICD-10-CM

## 2022-01-08 ENCOUNTER — Telehealth: Payer: Medicare Other

## 2022-01-19 ENCOUNTER — Encounter: Payer: Self-pay | Admitting: Family

## 2022-01-19 ENCOUNTER — Telehealth: Payer: Self-pay | Admitting: Family

## 2022-01-19 ENCOUNTER — Ambulatory Visit (INDEPENDENT_AMBULATORY_CARE_PROVIDER_SITE_OTHER): Payer: Medicare Other | Admitting: Family

## 2022-01-19 VITALS — BP 112/67 | HR 99 | Temp 99.6°F | Ht 63.0 in | Wt 115.2 lb

## 2022-01-19 DIAGNOSIS — F3342 Major depressive disorder, recurrent, in full remission: Secondary | ICD-10-CM | POA: Diagnosis not present

## 2022-01-19 DIAGNOSIS — G4700 Insomnia, unspecified: Secondary | ICD-10-CM

## 2022-01-19 DIAGNOSIS — C9110 Chronic lymphocytic leukemia of B-cell type not having achieved remission: Secondary | ICD-10-CM | POA: Diagnosis not present

## 2022-01-19 DIAGNOSIS — K219 Gastro-esophageal reflux disease without esophagitis: Secondary | ICD-10-CM

## 2022-01-19 DIAGNOSIS — F411 Generalized anxiety disorder: Secondary | ICD-10-CM

## 2022-01-19 MED ORDER — BUSPIRONE HCL 5 MG PO TABS: 5.0000 mg | ORAL_TABLET | Freq: Two times a day (BID) | ORAL | 1 refills | Status: DC | PRN

## 2022-01-19 NOTE — Progress Notes (Signed)
Subjective:    Patient ID: [name] Williams, female    DOB: [birth year], 88 y.o.   MRN: [id number]  Chief Complaint  Patient presents with   Medication Refill   Pt presents to the office today for worsening GAD and Depression. She has tried multiple medications, but does not continue them as she "does not like to take medications".   However, one son is in the hospital and her other son lives with her and multiple health problems.   Her son does lives in her basement and he is alcoholic. She reports this is a great deal of stress on her.  She also reports winter months are worse for her.    She has CLL and is followed by Oncologists.  Gastroesophageal Reflux She complains of belching and heartburn. This is a chronic problem. The current episode started more than 1 year ago. The problem occurs occasionally. The symptoms are aggravated by certain foods. Associated symptoms include fatigue. She has tried an antacid for the symptoms.  Depression        This is a chronic problem.  The current episode started more than 1 year ago.   The onset quality is gradual.   The problem occurs intermittently.  Associated symptoms include fatigue, helplessness, hopelessness, insomnia, irritable, restlessness and sad.  Associated symptoms include no suicidal ideas.  Past treatments include nothing.  Past medical history includes anxiety.   Anxiety Presents for follow-up visit. Symptoms include depressed mood, excessive worry, insomnia, irritability, nervous/anxious behavior and restlessness. Patient reports no suicidal ideas. Symptoms occur most days. The severity of symptoms is moderate.    Insomnia Primary symptoms: difficulty falling asleep, frequent awakening.   The current episode started more than one year. The onset quality is gradual. The problem occurs intermittently. Past treatments include medication. The treatment provided mild relief. PMH includes: depression.      Review of Systems   Constitutional:  Positive for fatigue and irritability.  Gastrointestinal:  Positive for heartburn.  Psychiatric/Behavioral:  Positive for depression. Negative for suicidal ideas. The patient is nervous/anxious and has insomnia.   All other systems reviewed and are negative.     Objective:   Physical Exam Vitals reviewed.  Constitutional:      General: She is irritable. She is not in acute distress.    Appearance: She is well-developed.  HENT:     Head: Normocephalic and atraumatic.  Eyes:     Pupils: Pupils are equal, round, and reactive to light.  Neck:     Thyroid: No thyromegaly.  Cardiovascular:     Rate and Rhythm: Normal rate and regular rhythm.     Heart sounds: Normal heart sounds. No murmur heard. Pulmonary:     Effort: Pulmonary effort is normal. No respiratory distress.     Breath sounds: Normal breath sounds. No wheezing.  Abdominal:     General: Bowel sounds are normal. There is no distension.     Palpations: Abdomen is soft.     Tenderness: There is no abdominal tenderness.  Musculoskeletal:        General: No tenderness. Normal range of motion.     Cervical back: Normal range of motion and neck supple.     Right lower leg: Edema (trace) present.     Left lower leg: Edema (trace) present.  Skin:    General: Skin is warm and dry.  Neurological:     Mental Status: She is alert and oriented to person, place, and time.  Cranial Nerves: No cranial nerve deficit.     Deep Tendon Reflexes: Reflexes are normal and symmetric.  Psychiatric:        Mood and Affect: Mood is anxious.        Behavior: Behavior normal.        Thought Content: Thought content normal.        Judgment: Judgment normal.      BP 112/67   Pulse 99   Temp 99.6 F (37.6 C)   Ht '5\' 3"'$  (1.6 m)   Wt 115 lb 3.2 oz (52.3 kg)   SpO2 95%   BMI 20.41 kg/m      Assessment & Plan:  [name] Williams comes in today with chief complaint of Medication Refill   Diagnosis and orders  addressed:  1. Gastroesophageal reflux disease, unspecified whether esophagitis present  2. Insomnia, unspecified type  3. Recurrent major depressive disorder, in full remission (HCC) Buspar 5 mg BID  Stress management  Follow up in 1 month - busPIRone (BUSPAR) 5 MG tablet; Take 1 tablet (5 mg total) by mouth 3 times/day as needed-between meals & bedtime.  Dispense: 60 tablet; Refill: 1  4. CLL (chronic lymphocytic leukemia) ([place])  5. GAD (generalized anxiety disorder) - busPIRone (BUSPAR) 5 MG tablet; Take 1 tablet (5 mg total) by mouth 3 times/day as needed-between meals & bedtime.  Dispense: 60 tablet; Refill: 1    Health Maintenance reviewed Diet and exercise encouraged  Follow up plan: 1 month    Evelina Dun, FNP

## 2022-01-19 NOTE — Patient Instructions (Signed)

## 2022-01-30 ENCOUNTER — Other Ambulatory Visit: Payer: Self-pay | Admitting: Family

## 2022-01-30 DIAGNOSIS — F411 Generalized anxiety disorder: Secondary | ICD-10-CM

## 2022-01-30 DIAGNOSIS — F3342 Major depressive disorder, recurrent, in full remission: Secondary | ICD-10-CM

## 2022-01-30 DIAGNOSIS — G4700 Insomnia, unspecified: Secondary | ICD-10-CM

## 2022-04-09 ENCOUNTER — Ambulatory Visit (INDEPENDENT_AMBULATORY_CARE_PROVIDER_SITE_OTHER): Payer: Medicare Other | Admitting: Family

## 2022-04-09 ENCOUNTER — Encounter: Payer: Self-pay | Admitting: Family

## 2022-04-09 VITALS — BP 137/82 | HR 75 | Temp 97.7°F | Ht 63.0 in | Wt 113.3 lb

## 2022-04-09 DIAGNOSIS — F411 Generalized anxiety disorder: Secondary | ICD-10-CM | POA: Diagnosis not present

## 2022-04-09 DIAGNOSIS — E441 Mild protein-calorie malnutrition: Secondary | ICD-10-CM

## 2022-04-09 DIAGNOSIS — F3342 Major depressive disorder, recurrent, in full remission: Secondary | ICD-10-CM | POA: Diagnosis not present

## 2022-04-09 MED ORDER — MIRTAZAPINE 15 MG PO TABS: 15.0000 mg | ORAL_TABLET | Freq: Every day | ORAL | 1 refills | Status: DC

## 2022-04-09 NOTE — Patient Instructions (Signed)
High-Protein and High-Calorie Diet Eating high-protein and high-calorie foods can help you to gain weight, heal after an injury, and recover after an illness or surgery. The specific amount of daily protein and calories you need depends on: Your body weight. The reason this diet is recommended for you. Generally, a high-protein, high-calorie diet involves: Eating 250-500 extra calories each day. Making sure that you get enough of your daily calories from protein. Ask your health care provider how many of your calories should come from protein. Talk with a health care provider or a dietitian about how much protein and how many calories you need each day. Follow the diet as directed by your health care provider. What are tips for following this plan?  Reading food labels Check the nutrition facts label for calories, grams of fat and protein. Items with more than 4 grams of protein are high-protein foods. Preparing meals Add whole milk, half-and-half, or heavy cream to cereal, pudding, soup, or hot cocoa. Add whole milk to instant breakfast drinks. Add peanut butter to oatmeal or smoothies. Add powdered milk to baked goods, smoothies, or milkshakes. Add powdered milk, cream, or butter to mashed potatoes. Add cheese to cooked vegetables. Make whole-milk yogurt parfaits. Top them with granola, fruit, or nuts. Add cottage cheese to fruit. Add avocado, cheese, or both to sandwiches or salads. Add avocado to smoothies. Add meat, poultry, or seafood to rice, pasta, casseroles, salads, and soups. Use mayonnaise when making egg salad, chicken salad, or tuna salad. Use peanut butter as a dip for fruits and vegetables or as a topping for pretzels, celery, or crackers. Add beans to casseroles, dips, and spreads. Add pureed beans to sauces and soups. Replace calorie-free drinks with calorie-containing drinks, such as milk and fruit juice. Replace water with milk or heavy cream when making foods such as  oatmeal, pudding, or cocoa. Add oil or butter to cooked vegetables and grains. Add cream cheese to sandwiches or as a topping on crackers and bread. Make cream-based pastas and soups. General information Ask your health care provider if you should take a nutritional supplement. Try to eat six small meals each day instead of three large meals. A general goal is to eat every 2 to 3 hours. Eat a balanced diet. In each meal, include one food that is high in protein and one food with fat in it. Keep nutritious snacks available, such as nuts, trail mixes, dried fruit, and yogurt. If you have kidney disease or diabetes, talk with your health care provider about how much protein is safe for you. Too much protein may put extra stress on your kidneys. Drink your calories. Choose high-calorie drinks and have them after your meals. Consider setting a timer to remind you to eat. You will want to eat even if you do not feel very hungry. What high-protein foods should I eat?  Vegetables Soybeans. Peas. Grains Quinoa. Bulgur wheat. Buckwheat. Meats and other proteins Beef, pork, and poultry. Fish and seafood. Eggs. Tofu. Textured vegetable protein (TVP). Peanut butter. Nuts and seeds. Dried beans. Protein powders. Hummus. Dairy Whole milk. Whole-milk yogurt. Powdered milk. Cheese. Cottage Cheese. Eggnog. Beverages High-protein supplement drinks. Soy milk. Other foods Protein bars. The items listed above may not be a complete list of foods and beverages you can eat and drink. Contact a dietitian for more information. What high-calorie foods should I eat? Fruits Dried fruit. Fruit leather. Canned fruit in syrup. Fruit juice. Avocado. Vegetables Vegetables cooked in oil or butter. Fried potatoes. Grains   Pasta. Quick breads. Muffins. Pancakes. Ready-to-eat cereal. Meats and other proteins Peanut butter. Nuts and seeds. Dairy Heavy cream. Whipped cream. Cream cheese. Sour cream. Ice cream. Custard.  Pudding. Whole milk dairy products. Beverages Meal-replacement beverages. Nutrition shakes. Fruit juice. Seasonings and condiments Salad dressing. Mayonnaise. Alfredo sauce. Fruit preserves or jelly. Honey. Syrup. Sweets and desserts Cake. Cookies. Pie. Pastries. Candy bars. Chocolate. Fats and oils Butter or margarine. Oil. Gravy. Other foods Meal-replacement bars. The items listed above may not be a complete list of foods and beverages you can eat and drink. Contact a dietitian for more information. Summary A high-protein, high-calorie diet can help you gain weight or heal faster after an injury, illness, or surgery. To increase your protein and calories, add ingredients such as whole milk, peanut butter, cheese, beans, meat, or seafood to meal items. To get enough extra calories each day, include high-calorie foods and beverages at each meal. Adding a high-calorie drink or shake can be an easy way to help you get enough calories each day. Talk with your healthcare provider or dietitian about the best options for you. This information is not intended to replace advice given to you by your health care provider. Make sure you discuss any questions you have with your health care provider. Document Revised: 07/10/2020 Document Reviewed: 07/10/2020 Elsevier Patient Education  2023 Elsevier Inc.  

## 2022-04-09 NOTE — Progress Notes (Signed)
Subjective:    Patient ID: [name] Williams, female    DOB: 1927/10/19, 88 y.o.   MRN: [id number]  Chief Complaint  Patient presents with   Weight Loss   PT presents to the office today with weight loss. Reports she lost 10 lbs, but according to our scales her weight is stable.      01/19/2022   12:16 PM 11/23/2021   10:15 AM 11/03/2021   11:10 AM  Last 3 Weights  Weight (lbs) 115 lb 3.2 oz 115 lb 3.2 oz 116 lb 9.6 oz  Weight (kg) 52.254 kg 52.254 kg 52.889 kg     We have given her Remeron in the past, but she took only for a few months and quit.   She has GAD and depression that is not controlled. States she has grown son that lives with her that puts increased stress on her.  Anxiety Presents for follow-up visit. Symptoms include excessive worry, irritability and nervous/anxious behavior. Symptoms occur occasionally.    Depression        This is a chronic problem.  The current episode started more than 1 year ago.   Associated symptoms include fatigue, helplessness, hopelessness and sad.  Past medical history includes anxiety.       Review of Systems  Constitutional:  Positive for fatigue and irritability.  Psychiatric/Behavioral:  Positive for depression. The patient is nervous/anxious.   All other systems reviewed and are negative.      Objective:   Physical Exam Vitals reviewed.  Constitutional:      General: She is not in acute distress.    Appearance: She is well-developed.     Comments: bony appearance  HENT:     Head: Normocephalic and atraumatic.     Right Ear: Tympanic membrane normal.     Left Ear: Tympanic membrane normal.  Eyes:     Pupils: Pupils are equal, round, and reactive to light.  Neck:     Thyroid: No thyromegaly.  Cardiovascular:     Rate and Rhythm: Normal rate and regular rhythm.     Heart sounds: Normal heart sounds. No murmur heard. Pulmonary:     Effort: Pulmonary effort is normal. No respiratory distress.     Breath sounds: Normal  breath sounds. No wheezing.  Abdominal:     General: Bowel sounds are normal. There is no distension.     Palpations: Abdomen is soft.     Tenderness: There is no abdominal tenderness.  Musculoskeletal:        General: No tenderness. Normal range of motion.     Cervical back: Normal range of motion and neck supple.  Skin:    General: Skin is warm and dry.  Neurological:     Mental Status: She is alert and oriented to person, place, and time.     Cranial Nerves: No cranial nerve deficit.     Deep Tendon Reflexes: Reflexes are normal and symmetric.  Psychiatric:        Behavior: Behavior normal.        Thought Content: Thought content normal.        Judgment: Judgment normal.     BP 137/82   Pulse 75   Temp 97.7 F (36.5 C) (Temporal)   Ht 5\' 3"  (1.6 m)   Wt 113 lb 4.8 oz (51.4 kg)   BMI 20.07 kg/m       Assessment & Plan:  [name] Tobin comes in today with chief complaint of Weight Loss  Diagnosis and orders addressed:  1. GAD (generalized anxiety disorder) - CMP14+EGFR  2. Recurrent major depressive disorder, in full remission ([place]) - CMP14+EGFR  3. Mild protein-calorie malnutrition ([place]) - CMP14+EGFR  Start Remeron 15 mg  Ensure TID with meals  Labs pending Health Maintenance reviewed Diet and exercise encouraged  Follow up plan: 3 months    Evelina Dun, FNP

## 2022-06-12 ENCOUNTER — Telehealth: Payer: Self-pay | Admitting: Family

## 2022-06-12 MED ORDER — MIRTAZAPINE 15 MG PO TABS
15.0000 mg | ORAL_TABLET | Freq: Every day | ORAL | 1 refills | Status: DC
Start: 2022-06-12 — End: 2022-12-20

## 2022-06-12 NOTE — Telephone Encounter (Signed)
Remeron 15 mg Prescription sent to pharmacy, take every night at bedtime.

## 2022-06-12 NOTE — Telephone Encounter (Signed)
Patient aware and verbalized understanding. °

## 2022-12-20 ENCOUNTER — Other Ambulatory Visit: Payer: Self-pay | Admitting: Family

## 2023-01-03 ENCOUNTER — Other Ambulatory Visit: Payer: Self-pay | Admitting: Family

## 2023-02-08 ENCOUNTER — Emergency Department (HOSPITAL_COMMUNITY)
Admission: EM | Admit: 2023-02-08 | Discharge: 2023-02-09 | Disposition: A | Discharge status: Home / Self Care | Payer: Medicare Other | Attending: Emergency Medicine | Admitting: Emergency Medicine

## 2023-02-08 ENCOUNTER — Other Ambulatory Visit: Payer: Self-pay

## 2023-02-08 ENCOUNTER — Emergency Department (HOSPITAL_COMMUNITY): Payer: Medicare Other

## 2023-02-08 ENCOUNTER — Encounter (HOSPITAL_COMMUNITY): Payer: Self-pay | Admitting: Emergency Medicine

## 2023-02-08 DIAGNOSIS — W01198A Fall on same level from slipping, tripping and stumbling with subsequent striking against other object, initial encounter: Secondary | ICD-10-CM | POA: Diagnosis not present

## 2023-02-08 DIAGNOSIS — M542 Cervicalgia: Secondary | ICD-10-CM | POA: Diagnosis not present

## 2023-02-08 DIAGNOSIS — Y9389 Activity, other specified: Secondary | ICD-10-CM | POA: Insufficient documentation

## 2023-02-08 DIAGNOSIS — I10 Essential (primary) hypertension: Secondary | ICD-10-CM | POA: Diagnosis not present

## 2023-02-08 DIAGNOSIS — S0990XA Unspecified injury of head, initial encounter: Secondary | ICD-10-CM | POA: Diagnosis not present

## 2023-02-08 DIAGNOSIS — Z87891 Personal history of nicotine dependence: Secondary | ICD-10-CM | POA: Insufficient documentation

## 2023-02-08 DIAGNOSIS — Z23 Encounter for immunization: Secondary | ICD-10-CM | POA: Diagnosis not present

## 2023-02-08 DIAGNOSIS — Y92009 Unspecified place in unspecified non-institutional (private) residence as the place of occurrence of the external cause: Secondary | ICD-10-CM | POA: Insufficient documentation

## 2023-02-08 DIAGNOSIS — S069X9A Unspecified intracranial injury with loss of consciousness of unspecified duration, initial encounter: Secondary | ICD-10-CM | POA: Diagnosis not present

## 2023-02-08 DIAGNOSIS — S0081XA Abrasion of other part of head, initial encounter: Secondary | ICD-10-CM | POA: Diagnosis not present

## 2023-02-08 DIAGNOSIS — S199XXA Unspecified injury of neck, initial encounter: Secondary | ICD-10-CM | POA: Diagnosis not present

## 2023-02-08 DIAGNOSIS — R9089 Other abnormal findings on diagnostic imaging of central nervous system: Secondary | ICD-10-CM | POA: Diagnosis not present

## 2023-02-08 DIAGNOSIS — R001 Bradycardia, unspecified: Secondary | ICD-10-CM | POA: Diagnosis not present

## 2023-02-08 DIAGNOSIS — M47812 Spondylosis without myelopathy or radiculopathy, cervical region: Secondary | ICD-10-CM | POA: Diagnosis not present

## 2023-02-08 LAB — CBC
HCT: 42.7 % (ref 36.0–46.0)
Hemoglobin: 14 g/dL (ref 12.0–15.0)
MCH: 31.3 pg (ref 26.0–34.0)
MCHC: 32.8 g/dL (ref 30.0–36.0)
MCV: 95.5 fL (ref 80.0–100.0)
Platelets: 159 10*3/uL (ref 150–400)
RBC: 4.47 MIL/uL (ref 3.87–5.11)
RDW: 13 % (ref 11.5–15.5)
WBC: 19.5 10*3/uL — ABNORMAL HIGH (ref 4.0–10.5)
nRBC: 0 % (ref 0.0–0.2)

## 2023-02-08 LAB — BASIC METABOLIC PANEL
Anion gap: 8 (ref 5–15)
BUN: 20 mg/dL (ref 8–23)
CO2: 26 mmol/L (ref 22–32)
Calcium: 8.9 mg/dL (ref 8.9–10.3)
Chloride: 104 mmol/L (ref 98–111)
Creatinine, Ser: 1.28 mg/dL — ABNORMAL HIGH (ref 0.44–1.00)
GFR, Estimated: 39 mL/min — ABNORMAL LOW (ref >60)
Glucose, Bld: 111 mg/dL — ABNORMAL HIGH (ref 70–99)
Potassium: 4 mmol/L (ref 3.5–5.1)
Sodium: 138 mmol/L (ref 135–145)

## 2023-02-08 MED ORDER — TETANUS-DIPHTH-ACELL PERTUSSIS 5-2.5-18.5 LF-MCG/0.5 IM SUSY
0.5000 mL | PREFILLED_SYRINGE | Freq: Once | INTRAMUSCULAR | Status: AC
  Administered 2023-02-08: 0.5 mL via INTRAMUSCULAR
  Filled 2023-02-08: qty 0.5

## 2023-02-08 NOTE — ED Notes (Signed)
Son Hessie Diener [phone] Will pick up pt when she is done in [place] Please call when pt is ready for discharge  Son took purse, sweater and shoes home Pt placed in yellow socks and given warm blankets.

## 2023-02-08 NOTE — ED Triage Notes (Signed)
Pt states she was after a black snake in her yard when she tripped and fell into a brick wall. Pt states she had loss of consciousness "for a little bit". Denies any blood thinners.

## 2023-02-08 NOTE — ED Notes (Signed)
Pt stated she was trying to kill a snake when she fell Snake did not bite pt Pt stated she fell against a wall. Has neck pain, hematoma and abrasion to face and head. LOC for 10-15 mins (per pt)

## 2023-02-08 NOTE — ED Notes (Signed)
All paperwork completed and printed for transport  Report called to [facility]

## 2023-02-08 NOTE — ED Provider Notes (Signed)
[place] EMERGENCY DEPARTMENT AT [facility] Provider Note  CSN: [id number] Arrival date & time: 02/08/23 2121  Chief Complaint(s) Fall and Loss of Consciousness  HPI [name] Williams is a 89 y.o. female with history of CLL, prior stroke, hypertension hypertrophic cardiomyopathy presenting to the emergency department with fall.  Patient reports that she was trying to kill a snake and then she fell and hit her head.  She thinks she was unconscious for 10 to 15 minutes but she is really not sure.  She denies any pain currently and was not bitten by a snake.  Denies blood thinner use.  No numbness or tingling.  No headaches, no neck pain, no fevers or chills, no chest or abdominal pain.   Past Medical History Past Medical History:  Diagnosis Date   Anxiety    Depressive disorder, not elsewhere classified    Esophageal reflux    Essential hypertension, benign    Hypertr obst cardiomyop    Other and unspecified hyperlipidemia    Stroke [facility])    Unspecified transient cerebral ischemia    Patient Active Problem List   Diagnosis Date Noted   CLL (chronic lymphocytic leukemia) (HCC) 04/07/2019   Leukocytosis 03/12/2019   Insomnia 01/07/2015   Osteopenia 01/07/2015   GAD (generalized anxiety disorder) 11/24/2014   Dermatitis, purulent 04/08/2014   Memory loss 01/28/2014   Fatigue 01/07/2014   Cerebral vascular accident (HCC) 12/03/2013   BRADYCARDIA 03/15/2010   SYNCOPE AND COLLAPSE 03/15/2010   Depression 09/14/2009   Transient cerebral ischemia 09/14/2009   GERD 09/14/2009   Hyperlipidemia 07/14/2008   HYPERTENSION, BENIGN 07/14/2008   Hypertrophic obstructive cardiomyopathy(425.11) 07/14/2008   Hypertrophic obstructive cardiomyopathy (HCC) 07/14/2008   Home Medication(s) Prior to Admission medications   Medication Sig Start Date End Date Taking? Authorizing Provider  busPIRone (BUSPAR) 5 MG tablet Take 1 tablet (5 mg total) by mouth 3 times/day as needed-between  meals & bedtime. Patient not taking: Reported on 04/09/2022 01/19/22   Jannifer Rodney A, FNP  mirtazapine (REMERON) 15 MG tablet Take 1 tablet (15 mg total) by mouth at bedtime. (NEEDS TO BE SEEN BEFORE NEXT REFILL) 12/20/22   Junie Spencer, FNP                                                                                                                                    Past Surgical History Past Surgical History:  Procedure Laterality Date   BLADDER REPAIR     CERVIX SURGERY     CHOLECYSTECTOMY     TONSILLECTOMY     TUBAL LIGATION     Family History Family History  Problem Relation Age of Onset   Breast cancer Mother    Stomach cancer Sister    Uterine cancer Sister    Depression Sister    Cancer Brother    Alcohol abuse Son     Social History Social History   Tobacco  Use   Smoking status: Former   Smokeless tobacco: Never   Tobacco comments:    quit 2012  Vaping Use   Vaping Use: Never used  Substance Use Topics   Alcohol use: No   Drug use: No   Allergies Latex  Review of Systems Review of Systems  All other systems reviewed and are negative.   Physical Exam Vital Signs  I have reviewed the triage vital signs BP (!) 145/79 (BP Location: Left Arm)   Pulse 67   Temp 98.4 F (36.9 C) (Oral)   Resp (!) 21   Ht 5\' 3"  (1.6 m)   Wt 47.6 kg   SpO2 98%   BMI 18.60 kg/m  Physical Exam Vitals and nursing note reviewed.  Constitutional:      General: She is not in acute distress.    Appearance: She is well-developed.  HENT:     Head: Normocephalic.     Comments: Scattered abrasions to the left face    Mouth/Throat:     Mouth: Mucous membranes are moist.  Eyes:     Pupils: Pupils are equal, round, and reactive to light.  Cardiovascular:     Rate and Rhythm: Normal rate and regular rhythm.     Heart sounds: No murmur heard. Pulmonary:     Effort: Pulmonary effort is normal. No respiratory distress.     Breath sounds: Normal breath sounds.   Abdominal:     General: Abdomen is flat.     Palpations: Abdomen is soft.     Tenderness: There is no abdominal tenderness.  Musculoskeletal:        General: No tenderness.     Cervical back: No tenderness.     Right lower leg: No edema.     Left lower leg: No edema.     Comments: No midline C, T, L-spine tenderness.  No chest wall tenderness or crepitus.  Full painless range of motion at the bilateral upper extremities including the shoulders, elbows, wrists, hand and fingers, and in the bilateral lower extremities including the hips, knees, ankle, toes.  No focal bony tenderness, injury or deformity.  Skin:    General: Skin is warm and dry.  Neurological:     General: No focal deficit present.     Mental Status: She is alert. Mental status is at baseline.  Psychiatric:        Mood and Affect: Mood normal.        Behavior: Behavior normal.     ED Results and Treatments Labs (all labs ordered are listed, but only abnormal results are displayed) Labs Reviewed  BASIC METABOLIC PANEL - Abnormal; Notable for the following components:      Result Value   Glucose, Bld 111 (*)    Creatinine, Ser 1.28 (*)    GFR, Estimated 39 (*)    All other components within normal limits  CBC - Abnormal; Notable for the following components:   WBC 19.5 (*)    All other components within normal limits  Radiology DG Cervical Spine 2-3 Views  Result Date: 02/08/2023 CLINICAL DATA:  Larey Seat, neck pain EXAM: CERVICAL SPINE - 2-3 VIEW COMPARISON:  None Available. FINDINGS: Frontal and lateral views of the cervical spine are obtained. Alignment is anatomic to the cervicothoracic junction on the lateral view. No acute displaced fracture. Severe spondylosis at the C6-7 level. Moderate diffuse facet hypertrophy greatest at C6-7 and C7-T1. Prevertebral soft tissues are unremarkable. Lung  apices are clear. IMPRESSION: 1. Multilevel cervical spondylosis and facet hypertrophy. No acute fracture. Electronically Signed   By: Sharlet Salina M.D.   On: 02/08/2023 22:16    Pertinent labs & imaging results that were available during my care of the patient were reviewed by me and considered in my medical decision making (see MDM for details).  Medications Ordered in ED Medications  Tdap (BOOSTRIX) injection 0.5 mL (has no administration in time range)                                                                                                                                     Procedures Procedures  (including critical care time)  Medical Decision Making / ED Course   MDM:  89 year old presenting with head injury.  Patient overall very well-appearing, she is alert and denies any headache or neurologic symptoms.  Given her age and loss of consciousness, patient will need head CT scan to evaluate for intracranial process such as fracture, intracranial bleeding.  She has some scattered abrasions but no wound needing repair.  She has no cervical spine tenderness but will need CT cervical spine as well.  Unfortunately CT scanner is broken here.  Patient will need to go to [facility] for CT scan of the head.  Will update tetanus for her abrasions.  Patient is accepted by Dr. Lockie Mola.      Additional history obtained: -Additional history obtained from family -External records from outside source obtained and reviewed including: Chart review including previous notes, labs, imaging, consultation notes including last PMD visit   Lab Tests: -I ordered, reviewed, and interpreted labs.   The pertinent results include:   Labs Reviewed  BASIC METABOLIC PANEL - Abnormal; Notable for the following components:      Result Value   Glucose, Bld 111 (*)    Creatinine, Ser 1.28 (*)    GFR, Estimated 39 (*)    All other components within normal limits  CBC - Abnormal; Notable  for the following components:   WBC 19.5 (*)    All other components within normal limits    Notable for chronic leukocytosis  EKG   EKG Interpretation  Date/Time:  Friday February 08 2023 21:56:09 EDT Ventricular Rate:  65 PR Interval:  148 QRS Duration: 86 QT Interval:  486 QTC Calculation: 506 R Axis:   49 Text Interpretation: Sinus rhythm Abnormal R-wave progression, early transition Consider left ventricular hypertrophy Abnrm T, consider ischemia, anterolateral lds  nonspecific st changes similar to previous in 2011 Confirmed by Alvino Blood ([id number]) on 02/08/2023 10:02:09 PM         Imaging Studies ordered: I ordered imaging studies including XR cervical spine On my interpretation imaging demonstrates no fracture I independently visualized and interpreted imaging. I agree with the radiologist interpretation   Medicines ordered and prescription drug management: Meds ordered this encounter  Medications   Tdap (BOOSTRIX) injection 0.5 mL    -I have reviewed the patients home medicines and have made adjustments as needed   Social Determinants of Health:  Diagnosis or treatment significantly limited by social determinants of health: lives alone   Reevaluation: After the interventions noted above, I reevaluated the patient and found that their symptoms have improved  Co morbidities that complicate the patient evaluation  Past Medical History:  Diagnosis Date   Anxiety    Depressive disorder, not elsewhere classified    Esophageal reflux    Essential hypertension, benign    Hypertr obst cardiomyop    Other and unspecified hyperlipidemia    Stroke (HCC)    Unspecified transient cerebral ischemia       Dispostion: Disposition decision including need for hospitalization was considered, and patient transferred.    Final Clinical Impression(s) / ED Diagnoses Final diagnoses:  Injury of head, initial encounter     This chart was dictated using voice  recognition software.  Despite best efforts to proofread,  errors can occur which can change the documentation meaning.    Lonell Grandchild, MD 02/08/23 2238

## 2023-02-08 NOTE — ED Notes (Signed)
Pt returned from XRAY Pt aware she is being transported for CT today Pt A&Ox4 and ambulatory Denies blurred vision, dizziness or HA Pt is not on blood thinners

## 2023-02-09 ENCOUNTER — Emergency Department (HOSPITAL_COMMUNITY): Payer: Medicare Other

## 2023-02-09 DIAGNOSIS — S069X9A Unspecified intracranial injury with loss of consciousness of unspecified duration, initial encounter: Secondary | ICD-10-CM | POA: Diagnosis not present

## 2023-02-09 DIAGNOSIS — S199XXA Unspecified injury of neck, initial encounter: Secondary | ICD-10-CM | POA: Diagnosis not present

## 2023-02-09 DIAGNOSIS — R9089 Other abnormal findings on diagnostic imaging of central nervous system: Secondary | ICD-10-CM | POA: Diagnosis not present

## 2023-02-09 NOTE — Discharge Instructions (Signed)
Evaluation today for your fall was overall reassuring.  CT scans were negative.  Recommend you do follow-up with your PCP.  If you have facial droop, slurred speech, visual disturbance, changes in your gait, weakness or numbness in your extremities or any other concerning symptom please return emergency department for further evaluation.

## 2023-02-09 NOTE — ED Provider Notes (Signed)
Accepted handoff at shift change from Dr. Denton Brick. Please see prior provider note for more detail.   Briefly: Patient is 89 y.o. presenting for mechanical fall with head injury.  Denies use of blood thinners.   DDX: concern for skull fracture, ICH, traumatic cervical spine injury  Plan: CT head and cervical spine, reassess.  If scans are reassuring likely discharge.  Physical Exam  BP 117/67   Pulse 62   Temp 99 F (37.2 C) (Oral)   Resp 16   Ht 5\' 3"  (1.6 m)   Wt 47.6 kg   SpO2 96%   BMI 18.60 kg/m   Physical Exam Vitals and nursing note reviewed.  HENT:     Head: Normocephalic and atraumatic.      Mouth/Throat:     Mouth: Mucous membranes are moist.  Eyes:     General:        Right eye: No discharge.        Left eye: No discharge.     Conjunctiva/sclera: Conjunctivae normal.  Cardiovascular:     Rate and Rhythm: Normal rate and regular rhythm.     Pulses: Normal pulses.     Heart sounds: Normal heart sounds.  Pulmonary:     Effort: Pulmonary effort is normal.     Breath sounds: Normal breath sounds.  Abdominal:     General: Abdomen is flat.     Palpations: Abdomen is soft.  Skin:    General: Skin is warm and dry.  Neurological:     General: No focal deficit present.     Comments: GCS 15. Speech is goal oriented. No deficits appreciated to CN III-XII; symmetric eyebrow raise, no facial drooping, tongue midline. Patient has equal grip strength bilaterally with 5/5 strength against resistance in all major muscle groups bilaterally. Sensation to light touch intact. Patient moves extremities without ataxia. Normal finger-nose-finger. Patient ambulatory with steady gait.  Psychiatric:        Mood and Affect: Mood normal.     Procedures  Procedures  ED Course / MDM    Medical Decision Making Amount and/or Complexity of Data Reviewed Labs: ordered. Radiology: ordered.  Risk Prescription drug management.   CT scans were negative.  Vital stable  throughout encounter.  Offered pain medicine but patient refused stating her pain was mild.  Discussed return precautions.  Discharged home with plans to follow-up with her PCP.       Gareth Eagle, PA-C 02/09/23 [id number]    Maia Plan, MD 02/14/23 [phone]

## 2023-02-09 NOTE — ED Notes (Signed)
Called pt's son Freida Busman at this time to provide her a ride home. States will be able to come get her in about an hour. Pt resting in bed at this time, respirations are regular and unlabored. Remains on the monitor.

## 2023-02-09 NOTE — ED Triage Notes (Signed)
Pt transfer from [facility] for CT scan to be done. Had LOC after falling and hitting brick wall while trying to catch a black snake in her yard. A&OX4 on arrival to ED.

## 2023-02-14 ENCOUNTER — Telehealth: Payer: Self-pay

## 2023-02-14 ENCOUNTER — Ambulatory Visit: Payer: Self-pay

## 2023-02-14 NOTE — Chronic Care Management (AMB) (Signed)
   02/14/2023  Meron Bocchino Gunner 11-25-1927 [id number]   Reason for Encounter: Patient is not currently enrolled in the CCM program. CCM status changed to previously enrolled  Alto Denver RN, MSN, CCM RN Care Manager  Chronic Care Management Direct Number: [phone]

## 2023-02-14 NOTE — Transitions of Care (Post Inpatient/ED Visit) (Signed)
   02/14/2023  Name: [name] Williams MRN: [id number] DOB: 12/31/1927  Today's TOC FU Call Status: Today's TOC FU Call Status:: Successful TOC FU Call Competed TOC FU Call Complete Date: 02/14/23  Transition Care Management Follow-up Telephone Call Date of Discharge: 02/08/23 Discharge Facility: [facility]) Type of Discharge: Emergency Department Reason for ED Visit: Other: (Fall, head injury) How have you been since you were released from the hospital?: Better (Per patient's daughter, she is doing well.  Patient had a sore neck and is self conscious of the bruises on her face. No headache, vomiting or change in vision.) Any questions or concerns?: No  Items Reviewed: Did you receive and understand the discharge instructions provided?: Yes Medications obtained,verified, and reconciled?: Yes (Medications Reviewed) Any new allergies since your discharge?: No Dietary orders reviewed?: No Do you have support at home?: Yes People in Home: child(ren), adult Name of Support/Comfort Primary Source: Burna Mortimer  Medications Reviewed Today: Medications Reviewed Today     Reviewed by Jodelle Gross, RN (Case Manager) on 02/14/23 at 1410  Med List Status: <None>   Medication Order Taking? Sig Documenting Provider Last Dose Status Informant  busPIRone (BUSPAR) 5 MG tablet [id number] No Take 1 tablet (5 mg total) by mouth 3 times/day as needed-between meals & bedtime.  Patient not taking: Reported on 04/09/2022   Junie Spencer, FNP Not Taking Active   mirtazapine (REMERON) 15 MG tablet [id number] No Take 1 tablet (15 mg total) by mouth at bedtime. (NEEDS TO BE SEEN BEFORE NEXT REFILL)  Patient not taking: Reported on 02/14/2023   Junie Spencer, FNP Not Taking Active             Home Care and Equipment/Supplies: Were Home Health Services Ordered?: No Any new equipment or medical supplies ordered?: No  Functional Questionnaire: Do you need assistance with bathing/showering or dressing?:  No Do you need assistance with meal preparation?: No Do you need assistance with eating?: No Do you have difficulty maintaining continence: No Do you need assistance with getting out of bed/getting out of a chair/moving?: No Do you have difficulty managing or taking your medications?: No  Follow up appointments reviewed: PCP Follow-up appointment confirmed?: No (Patient's daughter to call and schedule appointment with PCP) MD Provider Line [phone] Given: No Specialist Hospital Follow-up appointment confirmed?: NA Do you need transportation to your follow-up appointment?: No Do you understand care options if your condition(s) worsen?: Yes-patient verbalized understanding  Jodelle Gross, RN, BSN, CCM Care Management Coordinator [facility] Phone: [phone]/Fax: [phone]

## 2023-03-28 ENCOUNTER — Other Ambulatory Visit: Payer: Self-pay | Admitting: Family

## 2023-04-11 ENCOUNTER — Ambulatory Visit: Payer: Medicare Other | Admitting: Family

## 2023-04-12 ENCOUNTER — Encounter: Payer: Self-pay | Admitting: Family Medicine

## 2023-04-12 ENCOUNTER — Ambulatory Visit (INDEPENDENT_AMBULATORY_CARE_PROVIDER_SITE_OTHER): Payer: Medicare Other | Admitting: Family Medicine

## 2023-04-12 VITALS — BP 122/70 | HR 66 | Temp 97.8°F | Ht 63.0 in | Wt 107.0 lb

## 2023-04-12 DIAGNOSIS — R229 Localized swelling, mass and lump, unspecified: Secondary | ICD-10-CM | POA: Diagnosis not present

## 2023-04-12 MED ORDER — DOXYCYCLINE HYCLATE 100 MG PO TABS
100.0000 mg | ORAL_TABLET | Freq: Two times a day (BID) | ORAL | 0 refills | Status: AC
Start: 2023-04-12 — End: 2023-04-19

## 2023-04-12 NOTE — Patient Instructions (Signed)
Equate Eczema

## 2023-04-12 NOTE — Progress Notes (Signed)
Subjective:  Patient ID: [name] Williams, female    DOB: November 05, 1927, 89 y.o.   MRN: [id number]  Patient Care Team: Junie Spencer, FNP as PCP - General (Nurse Practitioner) Myrlene Broker, MD as Consulting Physician [facility]) Micki Riley, MD as Consulting Physician (Neurology) Randa Spike Kelton Pillar, LCSW as [facility] Care Management (Licensed Clinical Social Worker)   Chief Complaint:  bump on left arm  HPI: [name] Williams is a 89 y.o. female presenting on 04/12/2023 for bump on left arm  HPI Patient presents today with bump on her left forearm. Reports that she noticed it several weeks ago. Denies pain, endorses soreness, denies bite at the site. Has not tried anything over the counter. Denies fever. It does not drain, she has not tried to express it. She has not tried warm compresses.   Relevant past medical, surgical, family, and social history reviewed and updated as indicated.  Allergies and medications reviewed and updated. Data reviewed: Chart in Epic.   Past Medical History:  Diagnosis Date   Anxiety    Depressive disorder, not elsewhere classified    Esophageal reflux    Essential hypertension, benign    Hypertr obst cardiomyop    Other and unspecified hyperlipidemia    Stroke (HCC)    Unspecified transient cerebral ischemia     Past Surgical History:  Procedure Laterality Date   BLADDER REPAIR     CERVIX SURGERY     CHOLECYSTECTOMY     TONSILLECTOMY     TUBAL LIGATION      Social History   Socioeconomic History   Marital status: Widowed    Spouse name: Not on file   Number of children: 3   Years of education: Not on file   Highest education level: 9th grade  Occupational History   Not on file  Tobacco Use   Smoking status: Former   Smokeless tobacco: Never   Tobacco comments:    quit 2012  Vaping Use   Vaping status: Never Used  Substance and Sexual Activity   Alcohol use: No   Drug use: No   Sexual activity: Not  Currently  Other Topics Concern   Not on file  Social History Narrative   Patient is retired.    Patient is widowed.    Patient has 3 children.    Patient is right handed.    Pt lives with son.     Social Determinants of Health   Financial Resource Strain: Low Risk  (07/21/2018)   Overall Financial Resource Strain (CARDIA)    Difficulty of Paying Living Expenses: Not hard at all  Food Insecurity: No Food Insecurity (07/21/2018)   Hunger Vital Sign    Worried About Running Out of Food in the Last Year: Never true    Ran Out of Food in the Last Year: Never true  Transportation Needs: No Transportation Needs (11/01/2020)   PRAPARE - Administrator, Civil Service (Medical): No    Lack of Transportation (Non-Medical): No  Physical Activity: Inactive (11/16/2021)   Exercise Vital Sign    Days of Exercise per Week: 0 days    Minutes of Exercise per Session: 0 min  Stress: Stress Concern Present (11/16/2021)   [employer] of Occupational Health - Occupational Stress Questionnaire    Feeling of Stress : To some extent  Social Connections: Moderately Integrated (07/21/2018)   Social Connection and Isolation Panel [NHANES]    Frequency of Communication with  Friends and Family: Once a week    Frequency of Social Gatherings with Friends and Family: More than three times a week    Attends Religious Services: More than 4 times per year    Active Member of [employer] or Organizations: Yes    Attends Banker Meetings: More than 4 times per year    Marital Status: Widowed  Intimate Partner Violence: Not At Risk (07/21/2018)   Humiliation, Afraid, Rape, and Kick questionnaire    Fear of Current or Ex-Partner: No    Emotionally Abused: No    Physically Abused: No    Sexually Abused: No    Outpatient Encounter Medications as of 04/12/2023  Medication Sig   busPIRone (BUSPAR) 5 MG tablet Take 1 tablet (5 mg total) by mouth 3 times/day as needed-between meals & bedtime.  (Patient not taking: Reported on 04/09/2022)   mirtazapine (REMERON) 15 MG tablet Take 1 tablet (15 mg total) by mouth at bedtime. (NEEDS TO BE SEEN BEFORE NEXT REFILL) (Patient not taking: Reported on 02/14/2023)   No facility-administered encounter medications on file as of 04/12/2023.    Allergies  Allergen Reactions   Latex     Per pt 11-10-14 she do not remember being allergic to this.../or    Review of Systems As per HPI      Objective:  BP 122/70   Pulse 66   Temp 97.8 F (36.6 C)   Ht 5\' 3"  (1.6 m)   Wt 107 lb (48.5 kg)   SpO2 96%   BMI 18.95 kg/m    Wt Readings from Last 3 Encounters:  04/12/23 107 lb (48.5 kg)  02/08/23 105 lb (47.6 kg)  04/09/22 113 lb 4.8 oz (51.4 kg)    Physical Exam Constitutional:      General: She is awake. She is not in acute distress.    Appearance: Normal appearance. She is well-developed and well-groomed. She is not ill-appearing, toxic-appearing or diaphoretic.  Cardiovascular:     Rate and Rhythm: Normal rate and regular rhythm.     Pulses: Normal pulses.          Radial pulses are 2+ on the right side and 2+ on the left side.       Posterior tibial pulses are 2+ on the right side and 2+ on the left side.     Heart sounds: Normal heart sounds. No murmur heard.    No gallop.  Pulmonary:     Effort: Pulmonary effort is normal. No respiratory distress.     Breath sounds: Normal breath sounds. No stridor. No wheezing, rhonchi or rales.  Musculoskeletal:     Cervical back: Full passive range of motion without pain and neck supple.     Right lower leg: No edema.     Left lower leg: No edema.  Skin:    General: Skin is warm.     Capillary Refill: Capillary refill takes less than 2 seconds.     Comments: Raised ~1.5 cm nodular lesion on left forearm   Neurological:     General: No focal deficit present.     Mental Status: She is alert, oriented to person, place, and time and easily aroused. Mental status is at baseline.     GCS: GCS  eye subscore is 4. GCS verbal subscore is 5. GCS motor subscore is 6.     Motor: No weakness.  Psychiatric:        Attention and Perception: Attention and perception normal.  Mood and Affect: Mood and affect normal.        Speech: Speech normal.        Behavior: Behavior normal. Behavior is cooperative.        Thought Content: Thought content normal. Thought content does not include homicidal or suicidal ideation. Thought content does not include homicidal or suicidal plan.        Cognition and Memory: Cognition and memory normal.        Judgment: Judgment normal.      Results for orders placed or performed during the hospital encounter of 02/08/23  Basic metabolic panel  Result Value Ref Range   Sodium 138 135 - 145 mmol/L   Potassium 4.0 3.5 - 5.1 mmol/L   Chloride 104 98 - 111 mmol/L   CO2 26 22 - 32 mmol/L   Glucose, Bld 111 (H) 70 - 99 mg/dL   BUN 20 8 - 23 mg/dL   Creatinine, Ser 0.73 (H) 0.44 - 1.00 mg/dL   Calcium 8.9 8.9 - 71.0 mg/dL   GFR, Estimated 39 (L) >60 mL/min   Anion gap 8 5 - 15  CBC  Result Value Ref Range   WBC 19.5 (H) 4.0 - 10.5 K/uL   RBC 4.47 3.87 - 5.11 MIL/uL   Hemoglobin 14.0 12.0 - 15.0 g/dL   HCT 62.6 94.8 - 54.6 %   MCV 95.5 80.0 - 100.0 fL   MCH 31.3 26.0 - 34.0 pg   MCHC 32.8 30.0 - 36.0 g/dL   RDW 27.0 35.0 - 09.3 %   Platelets 159 150 - 400 K/uL   nRBC 0.0 0.0 - 0.2 %          04/12/2023   11:15 AM 04/09/2022    2:52 PM 01/19/2022   12:17 PM 11/03/2021   11:11 AM 09/25/2021    1:23 PM  Depression screen PHQ 2/9  Decreased Interest 0 0 3 0 1  Down, Depressed, Hopeless 0 0 3 2 1   PHQ - 2 Score 0 0 6 2 2   Altered sleeping 1  1 0 2  Tired, decreased energy 1  3 1 2   Change in appetite 1  3 3 2   Feeling bad or failure about yourself  0  0 0 1  Trouble concentrating 0  1 1 1   Moving slowly or fidgety/restless 0  0 0 2  Suicidal thoughts 0  0 0 0  PHQ-9 Score 3  14 7 12   Difficult doing work/chores Not difficult at all  Not  difficult at all Not difficult at all Somewhat difficult       04/12/2023   11:15 AM 09/15/2021   12:16 PM 06/16/2021    3:44 PM 03/17/2021    4:04 PM  GAD 7 : Generalized Anxiety Score  Nervous, Anxious, on Edge 0 0 0 0  Control/stop worrying 0 0 0 0  Worry too much - different things 0 0 0 0  Trouble relaxing 0 0 0 0  Restless 0 0 0 0  Easily annoyed or irritable 0 0 0 0  Afraid - awful might happen 0 0 0 0  Total GAD 7 Score 0 0 0 0  Anxiety Difficulty Not difficult at all      Pertinent labs & imaging results that were available during my care of the patient were reviewed by me and considered in my medical decision making.   I & D  Date/Time: 04/12/2023 6:24 PM  Performed by: Arrie Senate, FNP  Authorized by: Arrie Senate, FNP   Consent:    Consent obtained:  Written   Consent given by:  Patient   Risks, benefits, and alternatives were discussed: yes     Risks discussed:  Bleeding, incomplete drainage, pain and infection   Alternatives discussed:  Observation Location:    Type:  Cyst   Location:  Upper extremity Pre-procedure details:    Skin preparation:  Povidone-iodine Sedation:    Sedation type:  None Anesthesia:    Anesthesia method:  Local infiltration   Local anesthetic:  Lidocaine 2% WITH epi Procedure type:    Complexity:  Simple Procedure details:    Needle aspiration: no     Incision types:  Stab incision   Incision depth:  Dermal   Scalpel blade:  10   Wound management:  Debrided   Drainage:  Bloody   Drainage amount:  Scant   Wound treatment:  Wound left open   Packing materials:  None Post-procedure details:    Procedure completion:  Tolerated well, no immediate complications Comments:     Reviewed by Nadine Counts, DO  Assessment & Plan:  Allison Quarry "Nannie" was seen today for bump on left arm.  Diagnoses and all orders for this visit:  Skin nodule Procedure completed today. Referral placed as below for patient to  follow up with Dermatology for incomplete excision. Will empirically cover with antibiotics as below.  -     I & D -     Ambulatory referral to Dermatology -     doxycycline (VIBRA-TABS) 100 MG tablet; Take 1 tablet (100 mg total) by mouth 2 (two) times daily for 7 days.   Continue all other maintenance medications.  Follow up plan: Return if symptoms worsen or fail to improve.  Continue healthy lifestyle choices, including diet (rich in fruits, vegetables, and lean proteins, and low in salt and simple carbohydrates) and exercise (at least 30 minutes of moderate physical activity daily).  Written and verbal instructions provided   The above assessment and management plan was discussed with the patient. The patient verbalized understanding of and has agreed to the management plan. Patient is aware to call the clinic if they develop any new symptoms or if symptoms persist or worsen. Patient is aware when to return to the clinic for a follow-up visit. Patient educated on when it is appropriate to go to the emergency department.   Neale Burly, DNP-FNP [facility] [address] [place], [place] [address] [phone]

## 2023-04-16 ENCOUNTER — Other Ambulatory Visit: Payer: Self-pay | Admitting: Family

## 2023-04-16 MED ORDER — MIRTAZAPINE 15 MG PO TABS: 15.0000 mg | ORAL_TABLET | Freq: Every day | ORAL | 0 refills | Status: DC

## 2023-04-16 NOTE — Addendum Note (Signed)
Addended by: Julious Payer D on: 04/16/2023 04:34 PM   Modules accepted: Orders

## 2023-04-16 NOTE — Telephone Encounter (Signed)
Pt made first available appt in September

## 2023-04-16 NOTE — Progress Notes (Signed)
Please follow up to make sure patient picked up Rx of antibiotics.

## 2023-04-16 NOTE — Telephone Encounter (Signed)
[facility] pt NTBS 30-d given 03/05/23

## 2023-05-07 ENCOUNTER — Ambulatory Visit: Payer: Medicare Other | Admitting: Family

## 2023-05-08 ENCOUNTER — Encounter: Payer: Self-pay | Admitting: Family

## 2023-05-10 ENCOUNTER — Other Ambulatory Visit: Payer: Self-pay | Admitting: Family

## 2023-05-10 NOTE — Telephone Encounter (Signed)
I called pt & she said that she still has some pills left. She wants to take them first, then she will call back to make an appt w/[facility]. Also, I told her that she missed her appt this week as a NO SHOW.

## 2023-05-10 NOTE — Telephone Encounter (Signed)
[facility] NTBS last OV 04/09/22 NO RF sent to pharmacy last OV greater than a year

## 2023-05-28 ENCOUNTER — Other Ambulatory Visit: Payer: Self-pay | Admitting: Family

## 2023-05-28 NOTE — Telephone Encounter (Signed)
I called pt & she said that she doesn't need any refills at this time on her RX's. She said that she will call, when she needs anything & make an appt. She hung up the phone.

## 2023-06-10 ENCOUNTER — Ambulatory Visit (INDEPENDENT_AMBULATORY_CARE_PROVIDER_SITE_OTHER): Payer: Medicare Other | Admitting: Family

## 2023-06-10 ENCOUNTER — Encounter: Payer: Self-pay | Admitting: Family

## 2023-06-10 VITALS — BP 127/86 | HR 95 | Temp 98.3°F | Resp 20 | Ht 63.0 in | Wt 103.1 lb

## 2023-06-10 DIAGNOSIS — C9110 Chronic lymphocytic leukemia of B-cell type not having achieved remission: Secondary | ICD-10-CM

## 2023-06-10 DIAGNOSIS — F3342 Major depressive disorder, recurrent, in full remission: Secondary | ICD-10-CM | POA: Diagnosis not present

## 2023-06-10 DIAGNOSIS — R630 Anorexia: Secondary | ICD-10-CM | POA: Diagnosis not present

## 2023-06-10 MED ORDER — MIRTAZAPINE 7.5 MG PO TABS
7.5000 mg | ORAL_TABLET | Freq: Every day | ORAL | 1 refills | Status: DC
Start: 2023-06-10 — End: 2023-08-06

## 2023-06-10 MED ORDER — ASPIRIN 81 MG PO TBEC: 81.0000 mg | DELAYED_RELEASE_TABLET | Freq: Every day | ORAL | 12 refills | Status: DC

## 2023-06-10 NOTE — Progress Notes (Signed)
Subjective:    Patient ID: [name] Williams, female    DOB: 30-May-1928, 89 y.o.   MRN: [id number]  Chief Complaint  Patient presents with   Weight loss   PT presents to the office today with complaints of depression, decrease appetite, and weight loss. She has been seen multiple times for these symptoms and has taken remeron 15 mg in the past that helped. She stopped this because she does not like taking medications.      06/10/2023    1:53 PM 04/12/2023   11:10 AM 02/08/2023    9:35 PM  Last 3 Weights  Weight (lbs) 103 lb 2 oz 107 lb 105 lb  Weight (kg) 46.777 kg 48.535 kg 47.628 kg    She has CLL and followed by Oncologists.  Depression        This is a chronic problem.  The current episode started more than 1 year ago.   The problem occurs intermittently.  The problem has been gradually worsening since onset.  Associated symptoms include fatigue, decreased interest and sad.  Associated symptoms include no helplessness and no hopelessness.  Past treatments include nothing.     Review of Systems  Constitutional:  Positive for fatigue.  Psychiatric/Behavioral:  Positive for depression.   All other systems reviewed and are negative.      Objective:   Physical Exam Vitals reviewed.  Constitutional:      General: She is not in acute distress.    Appearance: She is well-developed.     Comments: Under weight   HENT:     Head: Normocephalic and atraumatic.     Right Ear: Tympanic membrane normal.     Left Ear: Tympanic membrane normal.  Eyes:     Pupils: Pupils are equal, round, and reactive to light.  Neck:     Thyroid: No thyromegaly.  Cardiovascular:     Rate and Rhythm: Normal rate and regular rhythm.     Heart sounds: Normal heart sounds. No murmur heard. Pulmonary:     Effort: Pulmonary effort is normal. No respiratory distress.     Breath sounds: Normal breath sounds. No wheezing.  Abdominal:     General: Bowel sounds are normal. There is no distension.      Palpations: Abdomen is soft.     Tenderness: There is no abdominal tenderness.  Musculoskeletal:        General: No tenderness. Normal range of motion.     Cervical back: Normal range of motion and neck supple.  Skin:    General: Skin is warm and dry.  Neurological:     Mental Status: She is alert and oriented to person, place, and time.     Cranial Nerves: No cranial nerve deficit.     Deep Tendon Reflexes: Reflexes are normal and symmetric.  Psychiatric:        Behavior: Behavior normal.        Thought Content: Thought content normal.        Judgment: Judgment normal.     BP 127/86   Pulse 95   Temp 98.3 F (36.8 C) (Oral)   Resp 20   Ht 5\' 3"  (1.6 m)   Wt 103 lb 2 oz (46.8 kg)   SpO2 97%   BMI 18.27 kg/m       Assessment & Plan:  [name] Williams comes in today with chief complaint of Weight loss   Diagnosis and orders addressed:  1. Recurrent major depressive disorder, in full  remission (HCC) Start remeron 7.5 mg  High protein diet - mirtazapine (REMERON) 7.5 MG tablet; Take 1 tablet (7.5 mg total) by mouth at bedtime.  Dispense: 90 tablet; Refill: 1 - CBC with Differential/Platelet - CMP14+EGFR  2. Decreased appetite - mirtazapine (REMERON) 7.5 MG tablet; Take 1 tablet (7.5 mg total) by mouth at bedtime.  Dispense: 90 tablet; Refill: 1 - CBC with Differential/Platelet - CMP14+EGFR  3. CLL (chronic lymphocytic leukemia) (HCC) Needs to follow up on Hematologists  - CBC with Differential/Platelet - CMP14+EGFR   Labs pending Start Remeron 7.5 mg   Follow up plan: 1 months    Jannifer Rodney, FNP

## 2023-06-10 NOTE — Patient Instructions (Signed)
Protein-Energy Malnutrition Protein-energy malnutrition is when a person does not eat enough protein, fat, and calories. When this happens over time, it can lead to severe loss of muscle tissue (muscle wasting). This condition also affects the body's defense system (immune system) and can lead to other health problems. What are the causes? This condition may be caused by: Not eating enough protein, fat, or calories. Having certain chronic medical conditions. Eating too little. What increases the risk? The following factors may make you more likely to develop this condition: Living in poverty. Long-term hospitalization. Alcohol or drug dependency. Addiction often leads to a lifestyle in which proper diet is ignored. Dependency can also hurt the metabolism and the body's ability to absorb nutrients. Eating disorders, such as anorexia nervosa or bulimia. Chewing or swallowing problems. People with these disorders may not eat enough. Having certain conditions, such as: Inflammatory bowel disease. Inflammation of the intestines makes it difficult for the body to absorb nutrients. Cancer or AIDS. These diseases can cause a loss of appetite. Chronic heart failure. This interferes with how the body uses nutrients. Cystic fibrosis. This disease can make it difficult for the body to absorb nutrients. Eating a diet that extremely restricts protein, fat, or calorie intake. What are the signs or symptoms? Symptoms of this condition include: Tiredness (fatigue). Weakness. Dizziness. Fainting. Weight loss. Loss of muscle tone and muscle mass. Poor immune response. Lack of menstruation. Poor memory. Hair loss. Skin changes. How is this diagnosed? This condition may be diagnosed based on: Your medical and dietary history. A physical exam. This may include a measurement of your body mass index. Blood tests. How is this treated? This condition may be managed with: Nutrition therapy. This may  include working with a Data processing manager. Treatment for underlying conditions. People with severe protein-energy malnutrition may need to be treated in a hospital. This may involve receiving nutrition and fluids through an IV. Follow these instructions at home:  Eat a balanced diet. In each meal, include at least one food that is high in protein. Foods that are high in protein include: Meat. Poultry. Fish. Eggs. Cheese. Milk. Beans. Nuts. Eat nutrient-rich foods that are easy to swallow and digest, such as: Fruit and yogurt smoothies. Oatmeal with nut butter. Nutrition supplement drinks. Try to eat six small meals each day instead of three large meals. Take vitamin and protein supplements as told by your health care provider or dietitian. Follow your health care provider's recommendations about exercise and activity. Keep all follow-up visits. This is important. Contact a health care provider if: You have increased weakness or fatigue. You faint. You are a woman and you stop having your period (menstruating). You have rapid hair loss. You have unexpected weight loss. You have diarrhea. You have nausea and vomiting. Get help right away if: You have difficulty breathing. You have chest pain. These symptoms may represent a serious problem that is an emergency. Do not wait to see if the symptoms will go away. Get medical help right away. Call your local emergency services (911 in the [place] Do not drive yourself to the hospital. Summary Protein-energy malnutrition is when a person does not eat enough protein, fat, and calories. Protein-energy malnutrition can lead to severe loss of muscle tissue (muscle wasting). This condition also affects the body's defense system (immune system) and can lead to other health problems. Talk with your health care provider about treatment for this condition. Effective treatment depends on the underlying cause of the malnutrition. This information is not  intended to replace advice given to you by your health care provider. Make sure you discuss any questions you have with your health care provider. Document Revised: 08/05/2020 Document Reviewed: 08/06/2020 Elsevier Patient Education  2024 [employer].

## 2023-08-06 ENCOUNTER — Ambulatory Visit (INDEPENDENT_AMBULATORY_CARE_PROVIDER_SITE_OTHER): Payer: Medicare Other | Admitting: Family

## 2023-08-06 ENCOUNTER — Encounter: Payer: Self-pay | Admitting: Family

## 2023-08-06 VITALS — BP 120/83 | HR 76 | Temp 97.9°F | Wt 101.2 lb

## 2023-08-06 DIAGNOSIS — E441 Mild protein-calorie malnutrition: Secondary | ICD-10-CM | POA: Diagnosis not present

## 2023-08-06 DIAGNOSIS — F3342 Major depressive disorder, recurrent, in full remission: Secondary | ICD-10-CM

## 2023-08-06 DIAGNOSIS — R630 Anorexia: Secondary | ICD-10-CM

## 2023-08-06 DIAGNOSIS — F411 Generalized anxiety disorder: Secondary | ICD-10-CM

## 2023-08-06 DIAGNOSIS — G4700 Insomnia, unspecified: Secondary | ICD-10-CM

## 2023-08-06 DIAGNOSIS — C9110 Chronic lymphocytic leukemia of B-cell type not having achieved remission: Secondary | ICD-10-CM

## 2023-08-06 DIAGNOSIS — R413 Other amnesia: Secondary | ICD-10-CM

## 2023-08-06 MED ORDER — MIRTAZAPINE 7.5 MG PO TABS
7.5000 mg | ORAL_TABLET | Freq: Every day | ORAL | 1 refills | Status: DC
Start: 2023-08-06 — End: 2024-01-22

## 2023-08-06 NOTE — Progress Notes (Signed)
Subjective:    Patient ID: [name] Williams, female    DOB: 07/01/1928, 89 y.o.   MRN: [id number] Chief Complaint  Patient presents with   Weight Loss   Bleeding/Bruising    On left leg    PT presents to the office today with complaints of depression, decrease appetite, and weight loss. She has been seen multiple times for these symptoms and has taken remeron 15 mg in the past that helped. She stopped this because she does not like taking medications. She was seen 06/10/23 and restarted on Remeron 7.5 mg, but states she has not taken that in "a long time". She is a poor historian. She does live at home by herself, but her family checks on her regularly.   She has CLL and followed by Oncologists.     08/06/2023    3:40 PM 06/10/2023    1:53 PM 04/12/2023   11:10 AM  Last 3 Weights  Weight (lbs) 101 lb 3.2 oz 103 lb 2 oz 107 lb  Weight (kg) 45.904 kg 46.777 kg 48.535 kg      Depression        This is a chronic problem.  The current episode started more than 1 year ago.   Associated symptoms include fatigue, hopelessness, restlessness, decreased interest and sad.  Associated symptoms include no helplessness.  Past medical history includes anxiety.   Anxiety Presents for follow-up visit. Symptoms include excessive worry, nervous/anxious behavior and restlessness. Symptoms occur occasionally. The severity of symptoms is mild.        Review of Systems  Constitutional:  Positive for fatigue.  Psychiatric/Behavioral:  Positive for depression. The patient is nervous/anxious.   All other systems reviewed and are negative.   Social History   Socioeconomic History   Marital status: Widowed    Spouse name: Not on file   Number of children: 3   Years of education: Not on file   Highest education level: 9th grade  Occupational History   Not on file  Tobacco Use   Smoking status: Former   Smokeless tobacco: Never   Tobacco comments:    quit 2012  Vaping Use   Vaping status: Never  Used  Substance and Sexual Activity   Alcohol use: No   Drug use: No   Sexual activity: Not Currently  Other Topics Concern   Not on file  Social History Narrative   Patient is retired.    Patient is widowed.    Patient has 3 children.    Patient is right handed.    Pt lives with son.     Social Drivers of Corporate investment banker Strain: Low Risk  (07/21/2018)   Overall Financial Resource Strain (CARDIA)    Difficulty of Paying Living Expenses: Not hard at all  Food Insecurity: No Food Insecurity (07/21/2018)   Hunger Vital Sign    Worried About Running Out of Food in the Last Year: Never true    Ran Out of Food in the Last Year: Never true  Transportation Needs: No Transportation Needs (11/01/2020)   PRAPARE - Administrator, Civil Service (Medical): No    Lack of Transportation (Non-Medical): No  Physical Activity: Inactive (11/16/2021)   Exercise Vital Sign    Days of Exercise per Week: 0 days    Minutes of Exercise per Session: 0 min  Stress: Stress Concern Present (11/16/2021)   [employer] of Occupational Health - Occupational Stress Questionnaire    Feeling of  Stress : To some extent  Social Connections: Moderately Integrated (07/21/2018)   Social Connection and Isolation Panel [NHANES]    Frequency of Communication with Friends and Family: Once a week    Frequency of Social Gatherings with Friends and Family: More than three times a week    Attends Religious Services: More than 4 times per year    Active Member of [employer] or Organizations: Yes    Attends Banker Meetings: More than 4 times per year    Marital Status: Widowed   Family History  Problem Relation Age of Onset   Breast cancer Mother    Stomach cancer Sister    Uterine cancer Sister    Depression Sister    Cancer Brother    Alcohol abuse Son         Objective:   Physical Exam Vitals reviewed.  Constitutional:      General: She is not in acute distress.     Appearance: She is well-developed.  HENT:     Head: Normocephalic and atraumatic.     Right Ear: Tympanic membrane normal.     Left Ear: Tympanic membrane normal.  Eyes:     Pupils: Pupils are equal, round, and reactive to light.  Neck:     Thyroid: No thyromegaly.  Cardiovascular:     Rate and Rhythm: Normal rate and regular rhythm.     Heart sounds: Normal heart sounds. No murmur heard. Pulmonary:     Effort: Pulmonary effort is normal. No respiratory distress.     Breath sounds: Normal breath sounds. No wheezing.  Abdominal:     General: Bowel sounds are normal. There is no distension.     Palpations: Abdomen is soft.     Tenderness: There is no abdominal tenderness.  Musculoskeletal:        General: No tenderness. Normal range of motion.     Cervical back: Normal range of motion and neck supple.  Skin:    General: Skin is warm and dry.     Findings: Bruising present.  Neurological:     Mental Status: She is alert and oriented to person, place, and time.     Cranial Nerves: No cranial nerve deficit.     Deep Tendon Reflexes: Reflexes are normal and symmetric.  Psychiatric:        Behavior: Behavior normal.        Thought Content: Thought content normal.        Judgment: Judgment normal.       BP 120/83   Pulse 76   Temp 97.9 F (36.6 C) (Temporal)   Wt 101 lb 3.2 oz (45.9 kg)   SpO2 95%   BMI 17.93 kg/m      Assessment & Plan:  [name] Oh comes in today with chief complaint of Weight Loss and Bleeding/Bruising (On left leg)   Diagnosis and orders addressed:  1. Recurrent major depressive disorder, in full remission (HCC) (Primary) - mirtazapine (REMERON) 7.5 MG tablet; Take 1 tablet (7.5 mg total) by mouth at bedtime.  Dispense: 90 tablet; Refill: 1 - CMP14+EGFR - CBC with Differential/Platelet  2. Memory loss - CMP14+EGFR - CBC with Differential/Platelet  3. GAD (generalized anxiety disorder) - mirtazapine (REMERON) 7.5 MG tablet; Take 1 tablet  (7.5 mg total) by mouth at bedtime.  Dispense: 90 tablet; Refill: 1 - CMP14+EGFR - CBC with Differential/Platelet  4. Insomnia, unspecified type - mirtazapine (REMERON) 7.5 MG tablet; Take 1 tablet (7.5 mg total) by mouth  at bedtime.  Dispense: 90 tablet; Refill: 1 - CMP14+EGFR - CBC with Differential/Platelet  5. CLL (chronic lymphocytic leukemia) (HCC) - CMP14+EGFR - CBC with Differential/Platelet  6. Mild protein-calorie malnutrition (HCC) - mirtazapine (REMERON) 7.5 MG tablet; Take 1 tablet (7.5 mg total) by mouth at bedtime.  Dispense: 90 tablet; Refill: 1 - CMP14+EGFR - CBC with Differential/Platelet  7. Decreased appetite - mirtazapine (REMERON) 7.5 MG tablet; Take 1 tablet (7.5 mg total) by mouth at bedtime.  Dispense: 90 tablet; Refill: 1 - CMP14+EGFR - CBC with Differential/Platelet   Labs pending Start Remeron 7.5 mg daily Encourage high protein diet  Continue current medications  Health Maintenance reviewed Diet and exercise encouraged  Return in about 3 months (around 11/04/2023), or if symptoms worsen or fail to improve, for chronic follow up.    Jannifer Rodney, FNP

## 2023-08-06 NOTE — Patient Instructions (Signed)
High-Protein and High-Calorie Diet Eating high-protein and high-calorie foods can help you to gain weight, heal after an injury, and recover after an illness or surgery. The specific amount of daily protein and calories you need depends on: Your body weight. The reason this diet is recommended for you. Generally, a high-protein, high-calorie diet involves: Eating 250-500 extra calories each day. Making sure that you get enough of your daily calories from protein. Ask your health care provider how many of your calories should come from protein. Talk with a health care provider or a dietitian about how much protein and how many calories you need each day. Follow the diet as directed by your health care provider. What are tips for following this plan?  Reading food labels Check the nutrition facts label for calories, grams of fat and protein. Items with more than 4 grams of protein are high-protein foods. Preparing meals Add whole milk, half-and-half, or heavy cream to cereal, pudding, soup, or hot cocoa. Add whole milk to instant breakfast drinks. Add peanut butter to oatmeal or smoothies. Add powdered milk to baked goods, smoothies, or milkshakes. Add powdered milk, cream, or butter to mashed potatoes. Add cheese to cooked vegetables. Make whole-milk yogurt parfaits. Top them with granola, fruit, or nuts. Add cottage cheese to fruit. Add avocado, cheese, or both to sandwiches or salads. Add avocado to smoothies. Add meat, poultry, or seafood to rice, pasta, casseroles, salads, and soups. Use mayonnaise when making egg salad, chicken salad, or tuna salad. Use peanut butter as a dip for fruits and vegetables or as a topping for pretzels, celery, or crackers. Add beans to casseroles, dips, and spreads. Add pureed beans to sauces and soups. Replace calorie-free drinks with calorie-containing drinks, such as milk and fruit juice. Replace water with milk or heavy cream when making foods such as  oatmeal, pudding, or cocoa. Add oil or butter to cooked vegetables and grains. Add cream cheese to sandwiches or as a topping on crackers and bread. Make cream-based pastas and soups. General information Ask your health care provider if you should take a nutritional supplement. Try to eat six small meals each day instead of three large meals. A general goal is to eat every 2 to 3 hours. Eat a balanced diet. In each meal, include one food that is high in protein and one food with fat in it. Keep nutritious snacks available, such as nuts, trail mixes, dried fruit, and yogurt. If you have kidney disease or diabetes, talk with your health care provider about how much protein is safe for you. Too much protein may put extra stress on your kidneys. Drink your calories. Choose high-calorie drinks and have them after your meals. Consider setting a timer to remind you to eat. You will want to eat even if you do not feel very hungry. What high-protein foods should I eat?  Vegetables Soybeans. Peas. Grains Quinoa. Bulgur wheat. Buckwheat. Meats and other proteins Beef, pork, and poultry. Fish and seafood. Eggs. Tofu. Textured vegetable protein (TVP). Peanut butter. Nuts and seeds. Dried beans. Protein powders. Hummus. Dairy Whole milk. Whole-milk yogurt. Powdered milk. Cheese. Cottage Cheese. Eggnog. Beverages High-protein supplement drinks. Soy milk. Other foods Protein bars. The items listed above may not be a complete list of foods and beverages you can eat and drink. Contact a dietitian for more information. What high-calorie foods should I eat? Fruits Dried fruit. Fruit leather. Canned fruit in syrup. Fruit juice. Avocado. Vegetables Vegetables cooked in oil or butter. Fried potatoes. Grains   Pasta. Quick breads. Muffins. Pancakes. Ready-to-eat cereal. Meats and other proteins Peanut butter. Nuts and seeds. Dairy Heavy cream. Whipped cream. Cream cheese. Sour cream. Ice cream. Custard.  Pudding. Whole milk dairy products. Beverages Meal-replacement beverages. Nutrition shakes. Fruit juice. Seasonings and condiments Salad dressing. Mayonnaise. Alfredo sauce. Fruit preserves or jelly. Honey. Syrup. Sweets and desserts Cake. Cookies. Pie. Pastries. Candy bars. Chocolate. Fats and oils Butter or margarine. Oil. Gravy. Other foods Meal-replacement bars. The items listed above may not be a complete list of foods and beverages you can eat and drink. Contact a dietitian for more information. Summary A high-protein, high-calorie diet can help you gain weight or heal faster after an injury, illness, or surgery. To increase your protein and calories, add ingredients such as whole milk, peanut butter, cheese, beans, meat, or seafood to meal items. To get enough extra calories each day, include high-calorie foods and beverages at each meal. Adding a high-calorie drink or shake can be an easy way to help you get enough calories each day. Talk with your healthcare provider or dietitian about the best options for you. This information is not intended to replace advice given to you by your health care provider. Make sure you discuss any questions you have with your health care provider. Document Revised: 07/08/2020 Document Reviewed: 07/10/2020 Elsevier Patient Education  2024 Elsevier Inc.  

## 2023-08-07 LAB — CBC WITH DIFFERENTIAL/PLATELET
Basophils Absolute: 0.1 10*3/uL (ref 0.0–0.2)
Basos: 1 %
EOS (ABSOLUTE): 0.2 10*3/uL (ref 0.0–0.4)
Eos: 1 %
Hematocrit: 42.4 % (ref 34.0–46.6)
Hemoglobin: 14.1 g/dL (ref 11.1–15.9)
Immature Grans (Abs): 0 10*3/uL (ref 0.0–0.1)
Immature Granulocytes: 0 %
Lymphocytes Absolute: 12 10*3/uL — ABNORMAL HIGH (ref 0.7–3.1)
Lymphs: 67 %
MCH: 31.1 pg (ref 26.6–33.0)
MCHC: 33.3 g/dL (ref 31.5–35.7)
MCV: 93 fL (ref 79–97)
Monocytes Absolute: 1 10*3/uL — ABNORMAL HIGH (ref 0.1–0.9)
Monocytes: 5 %
Neutrophils Absolute: 4.7 10*3/uL (ref 1.4–7.0)
Neutrophils: 26 %
Platelets: 198 10*3/uL (ref 150–450)
RBC: 4.54 x10E6/uL (ref 3.77–5.28)
RDW: 11.8 % (ref 11.7–15.4)
WBC: 18.1 10*3/uL — ABNORMAL HIGH (ref 3.4–10.8)

## 2023-08-07 LAB — CMP14+EGFR
ALT: 7 [IU]/L (ref 0–32)
AST: 17 [IU]/L (ref 0–40)
Albumin: 3.7 g/dL (ref 3.6–4.6)
Alkaline Phosphatase: 87 [IU]/L (ref 44–121)
BUN/Creatinine Ratio: 11 — ABNORMAL LOW (ref 12–28)
BUN: 12 mg/dL (ref 10–36)
Bilirubin Total: 0.8 mg/dL (ref 0.0–1.2)
CO2: 27 mmol/L (ref 20–29)
Calcium: 9.4 mg/dL (ref 8.7–10.3)
Chloride: 100 mmol/L (ref 96–106)
Creatinine, Ser: 1.11 mg/dL — ABNORMAL HIGH (ref 0.57–1.00)
Globulin, Total: 2.1 g/dL (ref 1.5–4.5)
Glucose: 123 mg/dL — ABNORMAL HIGH (ref 70–99)
Potassium: 4.2 mmol/L (ref 3.5–5.2)
Sodium: 141 mmol/L (ref 134–144)
Total Protein: 5.8 g/dL — ABNORMAL LOW (ref 6.0–8.5)
eGFR: 46 mL/min/{1.73_m2} — ABNORMAL LOW (ref >59)

## 2023-08-15 ENCOUNTER — Encounter (HOSPITAL_BASED_OUTPATIENT_CLINIC_OR_DEPARTMENT_OTHER): Payer: Self-pay | Admitting: Emergency Medicine

## 2023-08-15 ENCOUNTER — Emergency Department (HOSPITAL_BASED_OUTPATIENT_CLINIC_OR_DEPARTMENT_OTHER)
Admission: EM | Admit: 2023-08-15 | Discharge: 2023-08-15 | Disposition: A | Discharge status: Home / Self Care | Payer: Medicare Other

## 2023-08-15 ENCOUNTER — Emergency Department (HOSPITAL_BASED_OUTPATIENT_CLINIC_OR_DEPARTMENT_OTHER): Payer: Medicare Other

## 2023-08-15 ENCOUNTER — Other Ambulatory Visit: Payer: Self-pay

## 2023-08-15 DIAGNOSIS — Z7982 Long term (current) use of aspirin: Secondary | ICD-10-CM | POA: Diagnosis not present

## 2023-08-15 DIAGNOSIS — Z8673 Personal history of transient ischemic attack (TIA), and cerebral infarction without residual deficits: Secondary | ICD-10-CM | POA: Insufficient documentation

## 2023-08-15 DIAGNOSIS — R413 Other amnesia: Secondary | ICD-10-CM | POA: Diagnosis not present

## 2023-08-15 DIAGNOSIS — I10 Essential (primary) hypertension: Secondary | ICD-10-CM | POA: Diagnosis not present

## 2023-08-15 DIAGNOSIS — R42 Dizziness and giddiness: Secondary | ICD-10-CM | POA: Insufficient documentation

## 2023-08-15 DIAGNOSIS — R55 Syncope and collapse: Secondary | ICD-10-CM | POA: Insufficient documentation

## 2023-08-15 DIAGNOSIS — Z91040 Latex allergy status: Secondary | ICD-10-CM | POA: Insufficient documentation

## 2023-08-15 DIAGNOSIS — F0390 Unspecified dementia without behavioral disturbance: Secondary | ICD-10-CM | POA: Insufficient documentation

## 2023-08-15 LAB — BASIC METABOLIC PANEL
Anion gap: 8 (ref 5–15)
BUN: 24 mg/dL — ABNORMAL HIGH (ref 8–23)
CO2: 32 mmol/L (ref 22–32)
Calcium: 9.6 mg/dL (ref 8.9–10.3)
Chloride: 100 mmol/L (ref 98–111)
Creatinine, Ser: 1.29 mg/dL — ABNORMAL HIGH (ref 0.44–1.00)
GFR, Estimated: 39 mL/min — ABNORMAL LOW (ref >60)
Glucose, Bld: 145 mg/dL — ABNORMAL HIGH (ref 70–99)
Potassium: 4.2 mmol/L (ref 3.5–5.1)
Sodium: 140 mmol/L (ref 135–145)

## 2023-08-15 LAB — URINALYSIS, ROUTINE W REFLEX MICROSCOPIC
Bacteria, UA: NONE SEEN
Glucose, UA: NEGATIVE mg/dL
Nitrite: NEGATIVE
Protein, ur: 30 mg/dL — AB
Specific Gravity, Urine: 1.034 — ABNORMAL HIGH (ref 1.005–1.030)
pH: 5.5 (ref 5.0–8.0)

## 2023-08-15 LAB — CBC
HCT: 44.8 % (ref 36.0–46.0)
Hemoglobin: 14.8 g/dL (ref 12.0–15.0)
MCH: 30.8 pg (ref 26.0–34.0)
MCHC: 33 g/dL (ref 30.0–36.0)
MCV: 93.3 fL (ref 80.0–100.0)
Platelets: 197 10*3/uL (ref 150–400)
RBC: 4.8 MIL/uL (ref 3.87–5.11)
RDW: 12.9 % (ref 11.5–15.5)
WBC: 15.1 10*3/uL — ABNORMAL HIGH (ref 4.0–10.5)
nRBC: 0 % (ref 0.0–0.2)

## 2023-08-15 LAB — CBG MONITORING, ED: Glucose-Capillary: 117 mg/dL — ABNORMAL HIGH (ref 70–99)

## 2023-08-15 MED ORDER — SODIUM CHLORIDE 0.9 % IV BOLUS
500.0000 mL | Freq: Once | INTRAVENOUS | Status: AC
  Administered 2023-08-15: 500 mL via INTRAVENOUS

## 2023-08-15 MED ORDER — MECLIZINE HCL 12.5 MG PO TABS
12.5000 mg | ORAL_TABLET | Freq: Three times a day (TID) | ORAL | 0 refills | Status: DC | PRN
  Filled 2023-08-15: qty 30, 10d supply, fill #0

## 2023-08-15 MED ORDER — MECLIZINE HCL 25 MG PO TABS
12.5000 mg | ORAL_TABLET | Freq: Once | ORAL | Status: AC
  Administered 2023-08-15: 12.5 mg via ORAL
  Filled 2023-08-15: qty 1

## 2023-08-15 NOTE — ED Triage Notes (Signed)
Pt via pv from home with near syncopal episode and balance issues. Pt was caught by her grandson and did not hit the floor. Pt states she has had balance issues for 2-3 months. Pt lives with her son, who is unable to help her. Daughter in law is concerned that pt can't take care of herself any more. Pt has been known to leave food on the stove and is concerned for a social work referral. Pt HOH. Pt a&O x 4. Nad noted.

## 2023-08-15 NOTE — ED Notes (Addendum)
Provider requested Pt be ambulated... PT ambulated without complaint... No N/V, dizziness/faint or any other aliment to note... Provider aware.Marland KitchenMarland Kitchen

## 2023-08-15 NOTE — Discharge Instructions (Signed)
Your workup today was reassuring.  If you develop worsening symptoms please return to the ER or that point it would be better to go to [facility] or [facility] to have evaluated by physical therapy and our social work team.  I have placed a consult so you should receive a call soon to discuss this further and they will try to arrange for some home health and other services.  Please take the meclizine as needed.  Do not drive or drink alcohol while taking this as it may make you drowsy.

## 2023-08-15 NOTE — ED Notes (Signed)
Unable to print SQ label, indicates that SQ pt chart locked. Rebooted computer and waited for clear. Used pt label

## 2023-08-15 NOTE — ED Notes (Signed)
Report given to the next RN.Marland KitchenMarland Kitchen

## 2023-08-15 NOTE — ED Notes (Signed)
 RN reviewed discharge instructions with pt. Pt verbalized understanding and had no further questions. VSS upon discharge.  

## 2023-08-15 NOTE — ED Provider Notes (Signed)
[place] EMERGENCY DEPARTMENT AT [facility] Provider Note   CSN: [id number] Arrival date & time: 08/15/23  1339     History  Chief Complaint  Patient presents with   Near Syncope    [name] Williams is a 89 y.o. female.  89 year old female with past medical history of dementia, hypertension, and CVA in the past presenting to the emergency department today with worsening dizziness.  The patient has apparently been having issues with this over the past few months.  She has been falling a lot more lately.  Her family states she is also not eating or drinking like she normally does.  She most recently fell yesterday.  Family was with her and does not think that she hit her head.  She also left on the stove yesterday when family came for the holidays.  They report that she lives at home with her son who is an alcoholic it is not very much help for her.  She is denying any symptoms currently.  They report that the patient has been having increasing difficulty taking care of her self at home and her ADLs.   Near Syncope       Home Medications Prior to Admission medications   Medication Sig Start Date End Date Taking? Authorizing Provider  meclizine (ANTIVERT) 12.5 MG tablet Take 1 tablet (12.5 mg total) by mouth 3 (three) times daily as needed for dizziness. 08/15/23  Yes Durwin Glaze, MD  aspirin EC 81 MG tablet Take 1 tablet (81 mg total) by mouth daily. Swallow whole. Patient not taking: Reported on 08/06/2023 06/10/23   Jannifer Rodney A, FNP  mirtazapine (REMERON) 7.5 MG tablet Take 1 tablet (7.5 mg total) by mouth at bedtime. 08/06/23   Junie Spencer, FNP      Allergies    Latex    Review of Systems   Review of Systems  Reason unable to perform ROS: Dementia.  Cardiovascular:  Positive for near-syncope.  All other systems reviewed and are negative.   Physical Exam Updated Vital Signs BP (!) 143/77   Pulse 67   Temp 98.7 F (37.1 C) (Oral)   Resp 17   Ht  5\' 3"  (1.6 m)   Wt 45.4 kg   SpO2 98%   BMI 17.71 kg/m  Physical Exam Vitals and nursing note reviewed.   Gen: NAD Eyes: PERRL, EOMI HEENT: no oropharyngeal swelling Neck: trachea midline Resp: clear to auscultation bilaterally Card: RRR, no murmurs, rubs, or gallops Abd: nontender, nondistended Extremities: no calf tenderness, no edema Vascular: 2+ radial pulses bilaterally, 2+ DP pulses bilaterally Neuro: Cranial nerves intact, equal strength sensation throughout bilateral upper and lower extremities with no dysmetria on finger-to-nose testing Skin: no rashes Psyc: acting appropriately   ED Results / Procedures / Treatments   Labs (all labs ordered are listed, but only abnormal results are displayed) Labs Reviewed  BASIC METABOLIC PANEL - Abnormal; Notable for the following components:      Result Value   Glucose, Bld 145 (*)    BUN 24 (*)    Creatinine, Ser 1.29 (*)    GFR, Estimated 39 (*)    All other components within normal limits  CBC - Abnormal; Notable for the following components:   WBC 15.1 (*)    All other components within normal limits  URINALYSIS, ROUTINE W REFLEX MICROSCOPIC - Abnormal; Notable for the following components:   APPearance HAZY (*)    Specific Gravity, Urine 1.034 (*)  Hgb urine dipstick LARGE (*)    Bilirubin Urine SMALL (*)    Ketones, ur TRACE (*)    Protein, ur 30 (*)    Leukocytes,Ua LARGE (*)    All other components within normal limits  CBG MONITORING, ED - Abnormal; Notable for the following components:   Glucose-Capillary 117 (*)    All other components within normal limits    EKG EKG Interpretation Date/Time:  Thursday August 15 2023 16:10:44 EST Ventricular Rate:  63 PR Interval:    QRS Duration:  80 QT Interval:  425 QTC Calculation: 435 R Axis:   72  Text Interpretation: Atrial fibrillation Consider left ventricular hypertrophy Abnormal T, consider ischemia, diffuse leads Artifact in lead(s) I II aVR aVL aVF  V2 Confirmed by Beckey Downing [phone]) on 08/15/2023 4:15:27 PM  Radiology CT Head Wo Contrast Result Date: 08/15/2023 CLINICAL DATA:  Memory loss EXAM: CT HEAD WITHOUT CONTRAST TECHNIQUE: Contiguous axial images were obtained from the base of the skull through the vertex without intravenous contrast. RADIATION DOSE REDUCTION: This exam was performed according to the departmental dose-optimization program which includes automated exposure control, adjustment of the mA and/or kV according to patient size and/or use of iterative reconstruction technique. COMPARISON:  CT head 02/09/2023 FINDINGS: Brain: Cerebral ventricle sizes are concordant with the degree of cerebral volume loss. Patchy and confluent areas of decreased attenuation are noted throughout the deep and periventricular white matter of the cerebral hemispheres bilaterally, compatible with chronic microvascular ischemic disease. No evidence of large-territorial acute infarction. No parenchymal hemorrhage. No mass lesion. No extra-axial collection. No mass effect or midline shift. No hydrocephalus. Basilar cisterns are patent. Vascular: No hyperdense vessel. Patchy and confluent areas of decreased attenuation are noted throughout the deep and periventricular white matter of the cerebral hemispheres bilaterally, compatible with chronic microvascular ischemic disease. Skull: No acute fracture or focal lesion. Sinuses/Orbits: Paranasal sinuses and mastoid air cells are clear. The orbits are unremarkable. Other: None. IMPRESSION: No acute intracranial abnormality. Electronically Signed   By: Tish Frederickson M.D.   On: 08/15/2023 16:50    Procedures Procedures    Medications Ordered in ED Medications  meclizine (ANTIVERT) tablet 12.5 mg (12.5 mg Oral Given 08/15/23 1614)  sodium chloride 0.9 % bolus 500 mL (0 mLs Intravenous Stopped 08/15/23 1752)    ED Course/ Medical Decision Making/ A&P                                 Medical Decision  Making 89 year old female with past medical history of CVA and hypertension presenting to the emergency department today with dizziness.  I will further evaluate patient here with basic labs to evaluate for electrolyte abnormalities as family does report that she has had decreased appetite.  This also evaluate for other electrolyte abnormalities.  Will obtain a CT scan of her head to evaluate for intracranial hemorrhage or mass lesion.  Will give the patient meclizine here in the event that this is due to vertigo.  I will reevaluate for ultimate disposition.  Given her age and risk factors she may require MRI and further imaging if her initial workup is unremarkable.  The patient's labs and CT scan here are unremarkable.  After the meclizine the patient is able to ambulate without any assistance.  Amount and/or Complexity of Data Reviewed Labs: ordered. Radiology: ordered.           Final Clinical Impression(s) / ED Diagnoses  Final diagnoses:  Dizziness    Rx / DC Orders ED Discharge Orders          Ordered    meclizine (ANTIVERT) 12.5 MG tablet  3 times daily PRN        08/15/23 2112              Durwin Glaze, MD 08/15/23 2112

## 2023-08-16 ENCOUNTER — Other Ambulatory Visit (HOSPITAL_BASED_OUTPATIENT_CLINIC_OR_DEPARTMENT_OTHER): Payer: Self-pay

## 2023-08-19 ENCOUNTER — Telehealth: Payer: Self-pay | Admitting: Physician Assistant

## 2023-08-19 NOTE — Telephone Encounter (Signed)
Returned call to schedule labs and follow up. Called patient daughter, Burna Mortimer, at  [phone].  No answer.  Left detailed msg.

## 2023-08-26 ENCOUNTER — Other Ambulatory Visit (HOSPITAL_BASED_OUTPATIENT_CLINIC_OR_DEPARTMENT_OTHER): Payer: Self-pay

## 2023-09-05 ENCOUNTER — Other Ambulatory Visit: Payer: Self-pay

## 2023-09-05 ENCOUNTER — Inpatient Hospital Stay: Payer: Medicare Other | Admitting: Hematology

## 2023-09-05 ENCOUNTER — Inpatient Hospital Stay: Payer: Medicare Other | Attending: Hematology

## 2023-09-05 VITALS — BP 96/67 | HR 67 | Temp 97.4°F | Resp 16 | Wt 98.3 lb

## 2023-09-05 DIAGNOSIS — D649 Anemia, unspecified: Secondary | ICD-10-CM

## 2023-09-05 DIAGNOSIS — R55 Syncope and collapse: Secondary | ICD-10-CM | POA: Diagnosis not present

## 2023-09-05 DIAGNOSIS — F17210 Nicotine dependence, cigarettes, uncomplicated: Secondary | ICD-10-CM | POA: Insufficient documentation

## 2023-09-05 DIAGNOSIS — N189 Chronic kidney disease, unspecified: Secondary | ICD-10-CM | POA: Insufficient documentation

## 2023-09-05 DIAGNOSIS — F32A Depression, unspecified: Secondary | ICD-10-CM | POA: Diagnosis not present

## 2023-09-05 DIAGNOSIS — R630 Anorexia: Secondary | ICD-10-CM | POA: Insufficient documentation

## 2023-09-05 DIAGNOSIS — E559 Vitamin D deficiency, unspecified: Secondary | ICD-10-CM | POA: Diagnosis not present

## 2023-09-05 DIAGNOSIS — Z79899 Other long term (current) drug therapy: Secondary | ICD-10-CM | POA: Insufficient documentation

## 2023-09-05 DIAGNOSIS — R634 Abnormal weight loss: Secondary | ICD-10-CM | POA: Diagnosis not present

## 2023-09-05 DIAGNOSIS — C9110 Chronic lymphocytic leukemia of B-cell type not having achieved remission: Secondary | ICD-10-CM

## 2023-09-05 DIAGNOSIS — R42 Dizziness and giddiness: Secondary | ICD-10-CM | POA: Diagnosis not present

## 2023-09-05 LAB — COMPREHENSIVE METABOLIC PANEL WITH GFR
ALT: 9 U/L (ref 0–44)
AST: 20 U/L (ref 15–41)
Albumin: 3.6 g/dL (ref 3.5–5.0)
Alkaline Phosphatase: 70 U/L (ref 38–126)
Anion gap: 9 (ref 5–15)
BUN: 14 mg/dL (ref 8–23)
CO2: 29 mmol/L (ref 22–32)
Calcium: 9.2 mg/dL (ref 8.9–10.3)
Chloride: 100 mmol/L (ref 98–111)
Creatinine, Ser: 1.05 mg/dL — ABNORMAL HIGH (ref 0.44–1.00)
GFR, Estimated: 50 mL/min — ABNORMAL LOW (ref >60)
Glucose, Bld: 105 mg/dL — ABNORMAL HIGH (ref 70–99)
Potassium: 4.4 mmol/L (ref 3.5–5.1)
Sodium: 138 mmol/L (ref 135–145)
Total Bilirubin: 1.2 mg/dL (ref 0.0–1.2)
Total Protein: 6.8 g/dL (ref 6.5–8.1)

## 2023-09-05 LAB — CBC WITH DIFFERENTIAL/PLATELET
Abs Immature Granulocytes: 0 K/uL (ref 0.00–0.07)
Basophils Absolute: 0 K/uL (ref 0.0–0.1)
Basophils Relative: 0 %
Eosinophils Absolute: 0.5 K/uL (ref 0.0–0.5)
Eosinophils Relative: 3 %
HCT: 45 % (ref 36.0–46.0)
Hemoglobin: 14.3 g/dL (ref 12.0–15.0)
Lymphocytes Relative: 64 %
Lymphs Abs: 11.6 K/uL — ABNORMAL HIGH (ref 0.7–4.0)
MCH: 30.4 pg (ref 26.0–34.0)
MCHC: 31.8 g/dL (ref 30.0–36.0)
MCV: 95.7 fL (ref 80.0–100.0)
Monocytes Absolute: 0.7 K/uL (ref 0.1–1.0)
Monocytes Relative: 4 %
Neutro Abs: 5.3 K/uL (ref 1.7–7.7)
Neutrophils Relative %: 29 %
Platelets: 167 K/uL (ref 150–400)
RBC: 4.7 MIL/uL (ref 3.87–5.11)
RDW: 13 % (ref 11.5–15.5)
Smear Review: ADEQUATE
WBC: 18.2 K/uL — ABNORMAL HIGH (ref 4.0–10.5)
nRBC: 0 % (ref 0.0–0.2)

## 2023-09-05 LAB — LACTATE DEHYDROGENASE: LDH: 129 U/L (ref 98–192)

## 2023-09-05 LAB — VITAMIN D 25 HYDROXY (VIT D DEFICIENCY, FRACTURES): Vit D, 25-Hydroxy: 37.72 ng/mL (ref 30–100)

## 2023-09-05 MED ORDER — MEGESTROL ACETATE 400 MG/10ML PO SUSP: 400.0000 mg | Freq: Two times a day (BID) | ORAL | 3 refills | Status: DC

## 2023-09-05 NOTE — Patient Instructions (Signed)
[facility] at [facility] Discharge Instructions   You were seen and examined today by Dr. Ellin Saba.  He reviewed the results of your lab work which are normal/stable.   We will arrange for you to have a CT scan prior to your next visit since you have lost so much weight.   Return as scheduled.    Thank you for choosing [facility] at [facility] to provide your oncology and hematology care.  To afford each patient quality time with our provider, please arrive at least 15 minutes before your scheduled appointment time.   If you have a lab appointment with the Cancer Center please come in thru the Main Entrance and check in at the main information desk.  You need to re-schedule your appointment should you arrive 10 or more minutes late.  We strive to give you quality time with our providers, and arriving late affects you and other patients whose appointments are after yours.  Also, if you no show three or more times for appointments you may be dismissed from the clinic at the providers discretion.     Again, thank you for choosing [facility].  Our hope is that these requests will decrease the amount of time that you wait before being seen by our physicians.       _____________________________________________________________  Should you have questions after your visit to [facility], please contact our office at [phone] and follow the prompts.  Our office hours are 8:00 a.m. and 4:30 p.m. Monday - Friday.  Please note that voicemails left after 4:00 p.m. may not be returned until the following business day.  We are closed weekends and major holidays.  You do have access to a nurse 24-7, just call the main number to the clinic [phone] and do not press any options, hold on the line and a nurse will answer the phone.    For prescription refill requests, have your pharmacy contact our office and allow 72 hours.     Due to Covid, you will need to wear a mask upon entering the hospital. If you do not have a mask, a mask will be given to you at the Main Entrance upon arrival. For doctor visits, patients may have 1 support person age 67 or older with them. For treatment visits, patients can not have anyone with them due to social distancing guidelines and our immunocompromised population.

## 2023-09-05 NOTE — Progress Notes (Signed)
[facility] [address] [place] [address]    Clinic Day:  09/05/2023  Referring physician: Junie Spencer, FNP  Patient Care Team: Junie Spencer, FNP as PCP - General (Nurse Practitioner) Myrlene Broker, MD as Consulting Physician [facility]) Micki Riley, MD as Consulting Physician (Neurology) Randa Spike Kelton Pillar, LCSW as [facility] Care Management (Licensed Clinical Social Worker)   ASSESSMENT & PLAN:   Assessment: 1.  CLL, IGHV mutated, Tp53 negative: -She had elevated white count since 2009.  No fevers or night sweats.  Almost 20 pound weight loss in the last 2 months. -Peripheral blood flow cytometry was consistent with CLL. -Physical examination did not show any splenomegaly or lymphadenopathy. -CT CAP on 04/01/2019 showed mild mediastinal and bilateral hilar adenopathy measuring up to 11 mm.  Noncalcified pulmonary nodule seen in the right upper lobe. -CLL FISH panel showed trisomy 12 and 13 q. deletion.   2.  Weight loss: -18 to 20 pound weight loss in the last 2 months since a tick bite. -Lyme disease and RMSF antibody panel was negative.  Alpha gal panel showed low positivity for beef and pork IgE. -Currently on mirtazapine at bedtime which is not helping with appetite.   3.  CKD: -She had mild CKD since February 2016.   4.  Health maintenance: -Last colonoscopy was 05/2002 which showed normal rectum and diverticular disease.  Plan: 1.  CLL, IGHV mutated, Tp53 negative: - She does not have any fevers or night sweats but had 22 pound weight loss in the last 2 to 3 years. - Reviewed labs from 09/05/2023: Normal LFTs and LDH.  White count is 18.2 with normal hemoglobin and platelet count.  Absolute lymphocyte count is 11.6 and stable. - Due to unexplained weight loss, recommend CT CAP with contrast and follow-up after that.   2.   Loss of appetite: - Mirtazapine is not helping.  She lost about 22 pounds since last  visit in March 2022. - She reports decreased appetite.  She is drinking 1-2 protein drinks per day.  Eating very little. - Will start her on Megace 400 mg twice daily.   3.  Depression: - Continue mirtazapine daily.    Orders Placed This Encounter  Procedures   CT CHEST ABDOMEN PELVIS W CONTRAST    Standing Status:   Future    Expected Date:   09/19/2023    Expiration Date:   09/04/2024    If indicated for the ordered procedure, I authorize the administration of contrast media per Radiology protocol:   Yes    Does the patient have a contrast media/X-ray dye allergy?:   No    Preferred imaging location?:   [facility]    If indicated for the ordered procedure, I authorize the administration of oral contrast media per Radiology protocol:   Yes      I,Helena R Teague,acting as a scribe for Doreatha Massed, MD.,have documented all relevant documentation on the behalf of Doreatha Massed, MD,as directed by  Doreatha Massed, MD while in the presence of Doreatha Massed, MD.   I, Doreatha Massed MD, have reviewed the above documentation for accuracy and completeness, and I agree with the above.   Doreatha Massed, MD   1/16/20253:34 PM  CHIEF COMPLAINT:   Diagnosis: CLL   Cancer Staging  No matching staging information was found for the patient.    Prior Therapy: None  Current Therapy:  Observation   HISTORY OF  PRESENT ILLNESS:   Oncology History   No history exists.     INTERVAL HISTORY:   [name] Williams is a 89 y.o. female presenting to clinic today for follow up of CLL. She was last seen by me on 11/10/20 and [place] PA on 06/05/21.  Since her last visit, she presented to the ED on 08/15/23 for dizziness with a near syncope episode. She was given meclizine, then discharged.   Today, she states that she is doing well overall. Her appetite level is at 0%. Her energy level is at 0%. She is accompanied by her daughter.  She reports decreased  appetite, weakness, and near syncope episodes. She is taking Remeron without improvement in appetite. Her daughter notes she has lost around 21 pounds since 10/2020. She drinks 1-2 protein drinks a day and occasionally eats peanut butter. She generally does not eat any other foods, though she attempts to eat often. Her daughter reports she lies in bed most of the day.   PAST MEDICAL HISTORY:   Past Medical History: Past Medical History:  Diagnosis Date   Anxiety    Depressive disorder, not elsewhere classified    Esophageal reflux    Essential hypertension, benign    Hypertr obst cardiomyop    Other and unspecified hyperlipidemia    Stroke (HCC)    Unspecified transient cerebral ischemia     Surgical History: Past Surgical History:  Procedure Laterality Date   BLADDER REPAIR     CERVIX SURGERY     CHOLECYSTECTOMY     TONSILLECTOMY     TUBAL LIGATION      Social History: Social History   Socioeconomic History   Marital status: Widowed    Spouse name: Not on file   Number of children: 3   Years of education: Not on file   Highest education level: 9th grade  Occupational History   Not on file  Tobacco Use   Smoking status: Some Days    Types: Cigarettes   Smokeless tobacco: Never   Tobacco comments:    quit 2012  Vaping Use   Vaping status: Never Used  Substance and Sexual Activity   Alcohol use: No   Drug use: No   Sexual activity: Not Currently  Other Topics Concern   Not on file  Social History Narrative   Patient is retired.    Patient is widowed.    Patient has 3 children.    Patient is right handed.    Pt lives with son.     Social Drivers of Corporate investment banker Strain: Low Risk  (07/21/2018)   Overall Financial Resource Strain (CARDIA)    Difficulty of Paying Living Expenses: Not hard at all  Food Insecurity: No Food Insecurity (07/21/2018)   Hunger Vital Sign    Worried About Running Out of Food in the Last Year: Never true    Ran Out of  Food in the Last Year: Never true  Transportation Needs: No Transportation Needs (11/01/2020)   PRAPARE - Administrator, Civil Service (Medical): No    Lack of Transportation (Non-Medical): No  Physical Activity: Inactive (11/16/2021)   Exercise Vital Sign    Days of Exercise per Week: 0 days    Minutes of Exercise per Session: 0 min  Stress: Stress Concern Present (11/16/2021)   [employer] of Occupational Health - Occupational Stress Questionnaire    Feeling of Stress : To some extent  Social Connections: Moderately Integrated (07/21/2018)   Social  Connection and Isolation Panel [NHANES]    Frequency of Communication with Friends and Family: Once a week    Frequency of Social Gatherings with Friends and Family: More than three times a week    Attends Religious Services: More than 4 times per year    Active Member of [employer] or Organizations: Yes    Attends Banker Meetings: More than 4 times per year    Marital Status: Widowed  Intimate Partner Violence: Not At Risk (07/21/2018)   Humiliation, Afraid, Rape, and Kick questionnaire    Fear of Current or Ex-Partner: No    Emotionally Abused: No    Physically Abused: No    Sexually Abused: No    Family History: Family History  Problem Relation Age of Onset   Breast cancer Mother    Stomach cancer Sister    Uterine cancer Sister    Depression Sister    Cancer Brother    Alcohol abuse Son     Current Medications:  Current Outpatient Medications:    aspirin EC 81 MG tablet, Take 1 tablet (81 mg total) by mouth daily. Swallow whole., Disp: 30 tablet, Rfl: 12   megestrol (MEGACE) 400 MG/10ML suspension, Take 10 mLs (400 mg total) by mouth 2 (two) times daily., Disp: 480 mL, Rfl: 3   mirtazapine (REMERON) 7.5 MG tablet, Take 1 tablet (7.5 mg total) by mouth at bedtime., Disp: 90 tablet, Rfl: 1   meclizine (ANTIVERT) 12.5 MG tablet, Take 1 tablet (12.5 mg total) by mouth 3 (three) times daily as needed for  dizziness. (Patient not taking: Reported on 09/05/2023), Disp: 30 tablet, Rfl: 0   Allergies: Allergies  Allergen Reactions   Latex     Per pt 11-10-14 she do not remember being allergic to this.../or    REVIEW OF SYSTEMS:   Review of Systems  Constitutional:  Positive for appetite change (decreased). Negative for chills, fatigue and fever.       +generalized weakness  HENT:   Negative for lump/mass, mouth sores, nosebleeds, sore throat and trouble swallowing.   Eyes:  Negative for eye problems.  Respiratory:  Positive for shortness of breath. Negative for cough.   Cardiovascular:  Negative for chest pain, leg swelling and palpitations.  Gastrointestinal:  Negative for abdominal pain, constipation, diarrhea, nausea and vomiting.  Genitourinary:  Negative for bladder incontinence, difficulty urinating, dysuria, frequency, hematuria and nocturia.   Musculoskeletal:  Negative for arthralgias, back pain, flank pain, myalgias and neck pain.  Skin:  Negative for itching and rash.  Neurological:  Positive for dizziness. Negative for headaches and numbness.       +near syncope episodes  Hematological:  Does not bruise/bleed easily.  Psychiatric/Behavioral:  Negative for depression, sleep disturbance and suicidal ideas. The patient is not nervous/anxious.   All other systems reviewed and are negative.    VITALS:   Blood pressure 96/67, pulse 67, temperature (!) 97.4 F (36.3 C), temperature source Oral, resp. rate 16, weight 98 lb 5.2 oz (44.6 kg), SpO2 98%.  Wt Readings from Last 3 Encounters:  09/05/23 98 lb 5.2 oz (44.6 kg)  08/15/23 100 lb (45.4 kg)  08/06/23 101 lb 3.2 oz (45.9 kg)    Body mass index is 17.42 kg/m.  Performance status (ECOG): 1 - Symptomatic but completely ambulatory  PHYSICAL EXAM:   Physical Exam Vitals and nursing note reviewed. Exam conducted with a chaperone present.  Constitutional:      Appearance: Normal appearance.  Cardiovascular:  Rate and  Rhythm: Normal rate and regular rhythm.     Pulses: Normal pulses.     Heart sounds: Normal heart sounds.  Pulmonary:     Effort: Pulmonary effort is normal.     Breath sounds: Normal breath sounds.  Abdominal:     Palpations: Abdomen is soft. There is no hepatomegaly, splenomegaly or mass.     Tenderness: There is no abdominal tenderness.  Musculoskeletal:     Right lower leg: No edema.     Left lower leg: No edema.  Lymphadenopathy:     Cervical: No cervical adenopathy.     Right cervical: No superficial, deep or posterior cervical adenopathy.    Left cervical: No superficial, deep or posterior cervical adenopathy.     Upper Body:     Right upper body: No supraclavicular or axillary adenopathy.     Left upper body: No supraclavicular or axillary adenopathy.  Neurological:     General: No focal deficit present.     Mental Status: She is alert and oriented to person, place, and time.  Psychiatric:        Mood and Affect: Mood normal.        Behavior: Behavior normal.     LABS:   CBC     Component Value Date/Time   WBC 18.2 (H) 09/05/2023 1321   RBC 4.70 09/05/2023 1321   HGB 14.3 09/05/2023 1321   HGB 14.1 08/06/2023 1601   HCT 45.0 09/05/2023 1321   HCT 42.4 08/06/2023 1601   PLT 167 09/05/2023 1321   PLT 198 08/06/2023 1601   MCV 95.7 09/05/2023 1321   MCV 93 08/06/2023 1601   MCH 30.4 09/05/2023 1321   MCHC 31.8 09/05/2023 1321   RDW 13.0 09/05/2023 1321   RDW 11.8 08/06/2023 1601   LYMPHSABS 11.6 (H) 09/05/2023 1321   LYMPHSABS 12.0 (H) 08/06/2023 1601   MONOABS 0.7 09/05/2023 1321   EOSABS 0.5 09/05/2023 1321   EOSABS 0.2 08/06/2023 1601   BASOSABS 0.0 09/05/2023 1321   BASOSABS 0.1 08/06/2023 1601    CMP      Component Value Date/Time   NA 138 09/05/2023 1321   NA 141 08/06/2023 1601   K 4.4 09/05/2023 1321   CL 100 09/05/2023 1321   CO2 29 09/05/2023 1321   GLUCOSE 105 (H) 09/05/2023 1321   BUN 14 09/05/2023 1321   BUN 12 08/06/2023 1601    CREATININE 1.05 (H) 09/05/2023 1321   CALCIUM 9.2 09/05/2023 1321   PROT 6.8 09/05/2023 1321   PROT 5.8 (L) 08/06/2023 1601   ALBUMIN 3.6 09/05/2023 1321   ALBUMIN 3.7 08/06/2023 1601   AST 20 09/05/2023 1321   ALT 9 09/05/2023 1321   ALKPHOS 70 09/05/2023 1321   BILITOT 1.2 09/05/2023 1321   BILITOT 0.8 08/06/2023 1601   GFRNONAA 50 (L) 09/05/2023 1321   GFRAA 46 (L) 09/12/2020 1204     No results found for: "CEA1", "CEA" / No results found for: "CEA1", "CEA" No results found for: "PSA1" No results found for: "MWN027" No results found for: "CAN125"  No results found for: "TOTALPROTELP", "ALBUMINELP", "A1GS", "A2GS", "BETS", "BETA2SER", "GAMS", "MSPIKE", "SPEI" Lab Results  Component Value Date   TIBC 229 (L) 11/23/2021   TIBC 246 (L) 02/23/2019   FERRITIN 152 (H) 11/23/2021   FERRITIN 205 (H) 02/23/2019   IRONPCTSAT 28 11/23/2021   IRONPCTSAT 27 02/23/2019   Lab Results  Component Value Date   LDH 129 09/05/2023   LDH 134 06/05/2021  LDH 137 11/03/2020     STUDIES:   CT Head Wo Contrast Result Date: 08/15/2023 CLINICAL DATA:  Memory loss EXAM: CT HEAD WITHOUT CONTRAST TECHNIQUE: Contiguous axial images were obtained from the base of the skull through the vertex without intravenous contrast. RADIATION DOSE REDUCTION: This exam was performed according to the departmental dose-optimization program which includes automated exposure control, adjustment of the mA and/or kV according to patient size and/or use of iterative reconstruction technique. COMPARISON:  CT head 02/09/2023 FINDINGS: Brain: Cerebral ventricle sizes are concordant with the degree of cerebral volume loss. Patchy and confluent areas of decreased attenuation are noted throughout the deep and periventricular white matter of the cerebral hemispheres bilaterally, compatible with chronic microvascular ischemic disease. No evidence of large-territorial acute infarction. No parenchymal hemorrhage. No mass lesion. No  extra-axial collection. No mass effect or midline shift. No hydrocephalus. Basilar cisterns are patent. Vascular: No hyperdense vessel. Patchy and confluent areas of decreased attenuation are noted throughout the deep and periventricular white matter of the cerebral hemispheres bilaterally, compatible with chronic microvascular ischemic disease. Skull: No acute fracture or focal lesion. Sinuses/Orbits: Paranasal sinuses and mastoid air cells are clear. The orbits are unremarkable. Other: None. IMPRESSION: No acute intracranial abnormality. Electronically Signed   By: Tish Frederickson M.D.   On: 08/15/2023 16:50

## 2023-10-24 ENCOUNTER — Ambulatory Visit (HOSPITAL_COMMUNITY): Payer: Medicare Other

## 2023-10-31 ENCOUNTER — Inpatient Hospital Stay: Payer: Medicare Other | Admitting: Hematology

## 2023-12-05 ENCOUNTER — Other Ambulatory Visit: Payer: Self-pay | Admitting: Hematology

## 2024-01-06 ENCOUNTER — Encounter: Payer: Self-pay | Admitting: Nurse Practitioner

## 2024-01-06 ENCOUNTER — Ambulatory Visit (INDEPENDENT_AMBULATORY_CARE_PROVIDER_SITE_OTHER): Admitting: Nurse Practitioner

## 2024-01-06 VITALS — BP 126/76 | HR 79 | Temp 97.4°F | Ht 63.0 in | Wt 126.0 lb

## 2024-01-06 DIAGNOSIS — R600 Localized edema: Secondary | ICD-10-CM | POA: Diagnosis not present

## 2024-01-06 MED ORDER — FUROSEMIDE 20 MG PO TABS: 20.0000 mg | ORAL_TABLET | Freq: Every day | ORAL | 3 refills | Status: DC

## 2024-01-06 NOTE — Progress Notes (Signed)
   Subjective:    Patient ID: [name] Williams, female    DOB: 05/12/1928, 89 y.o.   MRN: [id number]   Chief Complaint: bilateral feet swelling   HPI  Patient in c/o bil feet swelling. Started a few months ago. Right more then left.she says she exercises daily. Swelling is worse in evening. Denies any chest pain or SOB. Patient Active Problem List   Diagnosis Date Noted   Mild protein-calorie malnutrition (HCC) 08/06/2023   CLL (chronic lymphocytic leukemia) (HCC) 04/07/2019   Leukocytosis 03/12/2019   Insomnia 01/07/2015   Osteopenia 01/07/2015   GAD (generalized anxiety disorder) 11/24/2014   Dermatitis, purulent 04/08/2014   Memory loss 01/28/2014   Fatigue 01/07/2014   Cerebral vascular accident (HCC) 12/03/2013   BRADYCARDIA 03/15/2010   SYNCOPE AND COLLAPSE 03/15/2010   Depression 09/14/2009   Transient cerebral ischemia 09/14/2009   GERD 09/14/2009   Hyperlipidemia 07/14/2008   HYPERTENSION, BENIGN 07/14/2008   Hypertrophic obstructive cardiomyopathy (HCC) 07/14/2008   Hypertrophic obstructive cardiomyopathy (HCC) 07/14/2008       Review of Systems  Constitutional:  Negative for diaphoresis.  Eyes:  Negative for pain.  Respiratory:  Negative for shortness of breath.   Cardiovascular:  Positive for leg swelling. Negative for chest pain and palpitations.  Gastrointestinal:  Negative for abdominal pain.  Endocrine: Negative for polydipsia.  Skin:  Negative for rash.  Neurological:  Negative for dizziness, weakness and headaches.  Hematological:  Does not bruise/bleed easily.  All other systems reviewed and are negative.      Objective:   Physical Exam Constitutional:      Appearance: Normal appearance.  Cardiovascular:     Rate and Rhythm: Normal rate and regular rhythm.     Heart sounds: Normal heart sounds.  Pulmonary:     Effort: Pulmonary effort is normal.     Breath sounds: Normal breath sounds.  Musculoskeletal:     Right lower leg: Edema (2+)  present.     Left lower leg: Edema (1+) present.  Neurological:     General: No focal deficit present.     Mental Status: She is alert and oriented to person, place, and time.  Psychiatric:        Mood and Affect: Mood normal.        Behavior: Behavior normal.      BP 126/76   Pulse 79   Temp (!) 97.4 F (36.3 C) (Temporal)   Ht 5\' 3"  (1.6 m)   Wt 126 lb (57.2 kg)   SpO2 97%   BMI 22.32 kg/m       Assessment & Plan:   [name] Williams in today with chief complaint of bilateral feet swelling   1. Peripheral edema (Primary) Lasix  20mg  daily for 3 days- then daily weights and if gain 2 lbs in 1 day take a fluid pill. Elevate when sitting Labs pending - BMP8+EGFR - Brain natriuretic peptide    The above assessment and management plan was discussed with the patient. The patient verbalized understanding of and has agreed to the management plan. Patient is aware to call the clinic if symptoms persist or worsen. Patient is aware when to return to the clinic for a follow-up visit. Patient educated on when it is appropriate to go to the emergency department.   Mary-Margaret Gaylyn Keas, FNP

## 2024-01-06 NOTE — Patient Instructions (Signed)
 Peripheral Edema  Peripheral edema is swelling that is caused by a buildup of fluid. Peripheral edema most often affects the lower legs, ankles, and feet. It can also develop in the arms, hands, and face. The area of the body that has peripheral edema will look swollen. It may also feel heavy or warm. Your clothes may start to feel tight. Pressing on the area may make a temporary dent in your skin (pitting edema). You may not be able to move your swollen arm or leg as much as usual. There are many causes of peripheral edema. It can happen because of a complication of other conditions such as heart failure, kidney disease, or a problem with your circulation. It also can be a side effect of certain medicines or happen because of an infection. It often happens to women during pregnancy. Sometimes, the cause is not known. Follow these instructions at home: Managing pain, stiffness, and swelling  Raise (elevate) your legs while you are sitting or lying down. Move around often to prevent stiffness and to reduce swelling. Do not sit or stand for long periods of time. Do not wear tight clothing. Do not wear garters on your upper legs. Exercise your legs to get your circulation going. This helps to move the fluid back into your blood vessels, and it may help the swelling go down. Wear compression stockings as told by your health care provider. These stockings help to prevent blood clots and reduce swelling in your legs. It is important that these are the correct size. These stockings should be prescribed by your doctor to prevent possible injuries. If elastic bandages or wraps are recommended, use them as told by your health care provider. Medicines Take over-the-counter and prescription medicines only as told by your health care provider. Your health care provider may prescribe medicine to help your body get rid of excess water (diuretic). Take this medicine if you are told to take it. General  instructions Eat a low-salt (low-sodium) diet as told by your health care provider. Sometimes, eating less salt may reduce swelling. Pay attention to any changes in your symptoms. Moisturize your skin daily to help prevent skin from cracking and draining. Keep all follow-up visits. This is important. Contact a health care provider if: You have a fever. You have swelling in only one leg. You have increased swelling, redness, or pain in one or both of your legs. You have drainage or sores at the area where you have edema. Get help right away if: You have edema that starts suddenly or is getting worse, especially if you are pregnant or have a medical condition. You develop shortness of breath, especially when you are lying down. You have pain in your chest or abdomen. You feel weak. You feel like you will faint. These symptoms may be an emergency. Get help right away. Call 911. Do not wait to see if the symptoms will go away. Do not drive yourself to the hospital. Summary Peripheral edema is swelling that is caused by a buildup of fluid. Peripheral edema most often affects the lower legs, ankles, and feet. Move around often to prevent stiffness and to reduce swelling. Do not sit or stand for long periods of time. Pay attention to any changes in your symptoms. Contact a health care provider if you have edema that starts suddenly or is getting worse, especially if you are pregnant or have a medical condition. Get help right away if you develop shortness of breath, especially when lying down.  This information is not intended to replace advice given to you by your health care provider. Make sure you discuss any questions you have with your health care provider. Document Revised: 04/10/2021 Document Reviewed: 04/10/2021 Elsevier Patient Education  2024 [employer].

## 2024-01-07 ENCOUNTER — Ambulatory Visit: Payer: Self-pay | Admitting: Nurse Practitioner

## 2024-01-07 LAB — BMP8+EGFR
BUN/Creatinine Ratio: 18 (ref 12–28)
BUN: 27 mg/dL (ref 10–36)
CO2: 22 mmol/L (ref 20–29)
Calcium: 9.5 mg/dL (ref 8.7–10.3)
Chloride: 107 mmol/L — ABNORMAL HIGH (ref 96–106)
Creatinine, Ser: 1.54 mg/dL — ABNORMAL HIGH (ref 0.57–1.00)
Glucose: 86 mg/dL (ref 70–99)
Potassium: 5.1 mmol/L (ref 3.5–5.2)
Sodium: 144 mmol/L (ref 134–144)
eGFR: 31 mL/min/{1.73_m2} — ABNORMAL LOW (ref >59)

## 2024-01-07 LAB — BRAIN NATRIURETIC PEPTIDE: BNP: 651.9 pg/mL — ABNORMAL HIGH (ref 0.0–100.0)

## 2024-01-20 ENCOUNTER — Observation Stay (HOSPITAL_COMMUNITY)

## 2024-01-20 ENCOUNTER — Ambulatory Visit: Admitting: Family

## 2024-01-20 ENCOUNTER — Other Ambulatory Visit: Payer: Self-pay

## 2024-01-20 ENCOUNTER — Encounter (HOSPITAL_BASED_OUTPATIENT_CLINIC_OR_DEPARTMENT_OTHER): Payer: Self-pay

## 2024-01-20 ENCOUNTER — Emergency Department (HOSPITAL_BASED_OUTPATIENT_CLINIC_OR_DEPARTMENT_OTHER)

## 2024-01-20 ENCOUNTER — Encounter: Payer: Self-pay | Admitting: Family

## 2024-01-20 ENCOUNTER — Ambulatory Visit (HOSPITAL_COMMUNITY): Admission: RE | Admit: 2024-01-20 | Source: Ambulatory Visit

## 2024-01-20 ENCOUNTER — Observation Stay (HOSPITAL_BASED_OUTPATIENT_CLINIC_OR_DEPARTMENT_OTHER)
Admission: EM | Admit: 2024-01-20 | Discharge: 2024-01-22 | Disposition: A | Discharge status: Home / Self Care | Attending: Family Medicine | Admitting: Family Medicine

## 2024-01-20 VITALS — BP 129/85 | HR 78 | Temp 98.0°F

## 2024-01-20 DIAGNOSIS — N179 Acute kidney failure, unspecified: Secondary | ICD-10-CM | POA: Diagnosis not present

## 2024-01-20 DIAGNOSIS — I509 Heart failure, unspecified: Secondary | ICD-10-CM | POA: Diagnosis not present

## 2024-01-20 DIAGNOSIS — N189 Chronic kidney disease, unspecified: Secondary | ICD-10-CM | POA: Insufficient documentation

## 2024-01-20 DIAGNOSIS — Z8673 Personal history of transient ischemic attack (TIA), and cerebral infarction without residual deficits: Secondary | ICD-10-CM | POA: Insufficient documentation

## 2024-01-20 DIAGNOSIS — Z7982 Long term (current) use of aspirin: Secondary | ICD-10-CM | POA: Insufficient documentation

## 2024-01-20 DIAGNOSIS — R29810 Facial weakness: Secondary | ICD-10-CM | POA: Diagnosis not present

## 2024-01-20 DIAGNOSIS — I129 Hypertensive chronic kidney disease with stage 1 through stage 4 chronic kidney disease, or unspecified chronic kidney disease: Secondary | ICD-10-CM | POA: Insufficient documentation

## 2024-01-20 DIAGNOSIS — Z91040 Latex allergy status: Secondary | ICD-10-CM | POA: Diagnosis not present

## 2024-01-20 DIAGNOSIS — R4701 Aphasia: Secondary | ICD-10-CM | POA: Insufficient documentation

## 2024-01-20 DIAGNOSIS — R296 Repeated falls: Secondary | ICD-10-CM | POA: Diagnosis not present

## 2024-01-20 DIAGNOSIS — E860 Dehydration: Principal | ICD-10-CM | POA: Diagnosis present

## 2024-01-20 DIAGNOSIS — R0602 Shortness of breath: Secondary | ICD-10-CM | POA: Diagnosis not present

## 2024-01-20 DIAGNOSIS — R4781 Slurred speech: Secondary | ICD-10-CM | POA: Diagnosis not present

## 2024-01-20 DIAGNOSIS — I10 Essential (primary) hypertension: Secondary | ICD-10-CM | POA: Diagnosis present

## 2024-01-20 DIAGNOSIS — R531 Weakness: Secondary | ICD-10-CM | POA: Diagnosis not present

## 2024-01-20 DIAGNOSIS — G319 Degenerative disease of nervous system, unspecified: Secondary | ICD-10-CM | POA: Diagnosis not present

## 2024-01-20 DIAGNOSIS — I6782 Cerebral ischemia: Secondary | ICD-10-CM | POA: Diagnosis not present

## 2024-01-20 DIAGNOSIS — N390 Urinary tract infection, site not specified: Secondary | ICD-10-CM | POA: Diagnosis not present

## 2024-01-20 DIAGNOSIS — Z7401 Bed confinement status: Secondary | ICD-10-CM | POA: Diagnosis not present

## 2024-01-20 DIAGNOSIS — N3000 Acute cystitis without hematuria: Secondary | ICD-10-CM | POA: Insufficient documentation

## 2024-01-20 DIAGNOSIS — R0689 Other abnormalities of breathing: Secondary | ICD-10-CM | POA: Diagnosis not present

## 2024-01-20 LAB — URINALYSIS, W/ REFLEX TO CULTURE (INFECTION SUSPECTED)
Bilirubin Urine: NEGATIVE
Glucose, UA: NEGATIVE mg/dL
Ketones, ur: NEGATIVE mg/dL
Nitrite: NEGATIVE
Protein, ur: 30 mg/dL — AB
RBC / HPF: >50 RBC/hpf (ref 0–5)
Specific Gravity, Urine: 1.025 (ref 1.005–1.030)
pH: 5.5 (ref 5.0–8.0)

## 2024-01-20 LAB — LACTIC ACID, PLASMA
Lactic Acid, Venous: 2.7 mmol/L (ref 0.5–1.9)
Lactic Acid, Venous: 1.1 mmol/L (ref 0.5–1.9)

## 2024-01-20 LAB — CBC WITH DIFFERENTIAL/PLATELET
Abs Immature Granulocytes: 0.26 10*3/uL — ABNORMAL HIGH (ref 0.00–0.07)
Basophils Absolute: 0.1 10*3/uL (ref 0.0–0.1)
Basophils Relative: 0 %
Eosinophils Absolute: 0.1 10*3/uL (ref 0.0–0.5)
Eosinophils Relative: 0 %
HCT: 49 % — ABNORMAL HIGH (ref 36.0–46.0)
Hemoglobin: 15.9 g/dL — ABNORMAL HIGH (ref 12.0–15.0)
Immature Granulocytes: 1 %
Lymphocytes Relative: 66 %
Lymphs Abs: 22.3 10*3/uL — ABNORMAL HIGH (ref 0.7–4.0)
MCH: 31.7 pg (ref 26.0–34.0)
MCHC: 32.4 g/dL (ref 30.0–36.0)
MCV: 97.8 fL (ref 80.0–100.0)
Monocytes Absolute: 1.5 10*3/uL — ABNORMAL HIGH (ref 0.1–1.0)
Monocytes Relative: 5 %
Neutro Abs: 9.2 10*3/uL — ABNORMAL HIGH (ref 1.7–7.7)
Neutrophils Relative %: 28 %
Platelets: 284 10*3/uL (ref 150–400)
RBC: 5.01 MIL/uL (ref 3.87–5.11)
RDW: 12.9 % (ref 11.5–15.5)
WBC: 33.5 10*3/uL — ABNORMAL HIGH (ref 4.0–10.5)
nRBC: 0 % (ref 0.0–0.2)

## 2024-01-20 LAB — COMPREHENSIVE METABOLIC PANEL WITH GFR
ALT: 13 U/L (ref 0–44)
AST: 27 U/L (ref 15–41)
Albumin: 4.2 g/dL (ref 3.5–5.0)
Alkaline Phosphatase: 69 U/L (ref 38–126)
Anion gap: 14 (ref 5–15)
BUN: 40 mg/dL — ABNORMAL HIGH (ref 8–23)
CO2: 26 mmol/L (ref 22–32)
Calcium: 9.9 mg/dL (ref 8.9–10.3)
Chloride: 101 mmol/L (ref 98–111)
Creatinine, Ser: 1.8 mg/dL — ABNORMAL HIGH (ref 0.44–1.00)
GFR, Estimated: 26 mL/min — ABNORMAL LOW (ref >60)
Glucose, Bld: 108 mg/dL — ABNORMAL HIGH (ref 70–99)
Potassium: 4.1 mmol/L (ref 3.5–5.1)
Sodium: 140 mmol/L (ref 135–145)
Total Bilirubin: 0.6 mg/dL (ref 0.0–1.2)
Total Protein: 7.5 g/dL (ref 6.5–8.1)

## 2024-01-20 MED ORDER — SODIUM CHLORIDE 0.9 % IV BOLUS
30.0000 mL/kg | Freq: Once | INTRAVENOUS | Status: AC
  Administered 2024-01-20: 1701 mL via INTRAVENOUS

## 2024-01-20 MED ORDER — SODIUM CHLORIDE 0.9% FLUSH
3.0000 mL | Freq: Two times a day (BID) | INTRAVENOUS | Status: DC
  Administered 2024-01-20 – 2024-01-22 (×4): 3 mL via INTRAVENOUS

## 2024-01-20 MED ORDER — SODIUM CHLORIDE 0.9 % IV SOLN
2.0000 g | INTRAVENOUS | Status: DC
  Administered 2024-01-21: 2 g via INTRAVENOUS
  Filled 2024-01-20: qty 20

## 2024-01-20 MED ORDER — ACETAMINOPHEN 650 MG RE SUPP: 650.0000 mg | Freq: Four times a day (QID) | RECTAL | Status: DC | PRN

## 2024-01-20 MED ORDER — HEPARIN SODIUM (PORCINE) 5000 UNIT/ML IJ SOLN
5000.0000 [IU] | Freq: Three times a day (TID) | INTRAMUSCULAR | Status: DC
  Administered 2024-01-20 – 2024-01-22 (×5): 5000 [IU] via SUBCUTANEOUS
  Filled 2024-01-20 (×5): qty 1

## 2024-01-20 MED ORDER — SODIUM CHLORIDE 0.9 % IV SOLN
2.0000 g | INTRAVENOUS | Status: DC
  Administered 2024-01-20: 2 g via INTRAVENOUS
  Filled 2024-01-20: qty 20

## 2024-01-20 MED ORDER — ACETAMINOPHEN 325 MG PO TABS
650.0000 mg | ORAL_TABLET | Freq: Four times a day (QID) | ORAL | Status: DC | PRN
Start: 2024-01-20 — End: 2024-01-22

## 2024-01-20 MED ORDER — SODIUM CHLORIDE 0.9 % IV SOLN: INTRAVENOUS | Status: DC

## 2024-01-20 MED ORDER — SODIUM CHLORIDE 0.9 % IV BOLUS: 1000.0000 mL | Freq: Once | INTRAVENOUS | Status: DC

## 2024-01-20 MED ORDER — SODIUM CHLORIDE 0.9 % IV SOLN: Freq: Once | INTRAVENOUS | Status: AC

## 2024-01-20 NOTE — ED Triage Notes (Signed)
 Pt complaining of having no energy, stumbling and falling, slurred speech, started Friday. This morning her right side of mouth is drawn to one side.

## 2024-01-20 NOTE — ED Notes (Signed)
 Attempting to call report. No answer at this time

## 2024-01-20 NOTE — ED Provider Notes (Addendum)
 [place] EMERGENCY DEPARTMENT AT [facility] Provider Note   CSN: [id number] Arrival date & time: 01/20/24  1152     History  No chief complaint on file.   [name] Williams is a 89 y.o. female.  89 year old female presents due to trouble with her speech for several days.  Family states that they felt that the right side of her face was drooping.  They state the patient seemed to have trouble getting the words out.  Patient denies any headache or visual changes.  She has no focal weakness in her arms or legs.  She is able to ambulate at her baseline and do her ADLs.  Patient states she just does not feel well.  Denies any urinary symptoms.  No new medication changes.  Patient has had gradual weakness for some time although family states they think it may be a little worse today.  The patient does have a history of CHF in the past.  Notes slight increased cough.       Home Medications Prior to Admission medications   Medication Sig Start Date End Date Taking? Authorizing Provider  aspirin  EC 81 MG tablet Take 1 tablet (81 mg total) by mouth daily. Swallow whole. 06/10/23   Tommas Fragmin A, FNP  furosemide  (LASIX ) 20 MG tablet Take 1 tablet (20 mg total) by mouth daily. 01/06/24   Gaylyn Keas, Mary-Margaret, FNP  meclizine  (ANTIVERT ) 12.5 MG tablet Take 1 tablet (12.5 mg total) by mouth 3 (three) times daily as needed for dizziness. Patient not taking: Reported on 01/20/2024 08/15/23   Carin Charleston, MD  megestrol  (MEGACE ) 40 MG/ML suspension TAKE 10 MLS (400 MG TOTAL) BY MOUTH 2 (TWO) TIMES DAILY. 12/05/23   Paulett Boros, MD  mirtazapine  (REMERON ) 7.5 MG tablet Take 1 tablet (7.5 mg total) by mouth at bedtime. 08/06/23   Yevette Hem, FNP      Allergies    Latex    Review of Systems   Review of Systems  All other systems reviewed and are negative.   Physical Exam Updated Vital Signs BP 130/78 (BP Location: Right Arm)   Pulse 84   Temp 97.8 F (36.6 C)   Resp 18    Ht 1.6 m (5\' 3" )   Wt 56.7 kg   SpO2 100%   BMI 22.14 kg/m  Physical Exam Vitals and nursing note reviewed.  Constitutional:      General: She is not in acute distress.    Appearance: Normal appearance. She is well-developed. She is not toxic-appearing.  HENT:     Head: Normocephalic and atraumatic.  Eyes:     General: Lids are normal.     Conjunctiva/sclera: Conjunctivae normal.     Pupils: Pupils are equal, round, and reactive to light.  Neck:     Thyroid : No thyroid  mass.     Trachea: No tracheal deviation.  Cardiovascular:     Rate and Rhythm: Normal rate and regular rhythm.     Heart sounds: Normal heart sounds. No murmur heard.    No gallop.  Pulmonary:     Effort: Pulmonary effort is normal. No respiratory distress.     Breath sounds: Normal breath sounds. No stridor. No decreased breath sounds, wheezing, rhonchi or rales.  Abdominal:     General: There is no distension.     Palpations: Abdomen is soft.     Tenderness: There is no abdominal tenderness. There is no rebound.  Musculoskeletal:        General:  No tenderness. Normal range of motion.     Cervical back: Normal range of motion and neck supple.  Skin:    General: Skin is warm and dry.     Findings: No abrasion or rash.  Neurological:     General: No focal deficit present.     Mental Status: She is alert and oriented to person, place, and time. Mental status is at baseline.     GCS: GCS eye subscore is 4. GCS verbal subscore is 5. GCS motor subscore is 6.     Cranial Nerves: No cranial nerve deficit.     Sensory: No sensory deficit.     Motor: Motor function is intact.  Psychiatric:        Attention and Perception: Attention normal.        Speech: Speech normal.        Behavior: Behavior normal.     ED Results / Procedures / Treatments   Labs (all labs ordered are listed, but only abnormal results are displayed) Labs Reviewed  CBC WITH DIFFERENTIAL/PLATELET - Abnormal; Notable for the following  components:      Result Value   WBC 33.5 (*)    Hemoglobin 15.9 (*)    HCT 49.0 (*)    Neutro Abs 9.2 (*)    Lymphs Abs 22.3 (*)    Monocytes Absolute 1.5 (*)    Abs Immature Granulocytes 0.26 (*)    All other components within normal limits  COMPREHENSIVE METABOLIC PANEL WITH GFR - Abnormal; Notable for the following components:   Glucose, Bld 108 (*)    BUN 40 (*)    Creatinine, Ser 1.80 (*)    GFR, Estimated 26 (*)    All other components within normal limits  CULTURE, BLOOD (ROUTINE X 2)  CULTURE, BLOOD (ROUTINE X 2)  LACTIC ACID, PLASMA  LACTIC ACID, PLASMA    EKG EKG Interpretation Date/Time:  Monday January 20 2024 13:38:44 EDT Ventricular Rate:  72 PR Interval:  137 QRS Duration:  79 QT Interval:  434 QTC Calculation: 475 R Axis:   47  Text Interpretation: Sinus rhythm Abnormal R-wave progression, early transition Abnormal T, consider ischemia, diffuse leads No significant change since last tracing Confirmed by Lind Repine ([id number]) on 01/20/2024 2:48:03 PM  Radiology No results found.  Procedures Procedures    Medications Ordered in ED Medications  0.9 %  sodium chloride  infusion (has no administration in time range)    ED Course/ Medical Decision Making/ A&P                                 Medical Decision Making Amount and/or Complexity of Data Reviewed Labs: ordered. Radiology: ordered. ECG/medicine tests: ordered.  Risk Prescription drug management.   Patient's EKG shows normal sinus rhythm at 72.  Patient's lab significant for moderate leukocytosis of 33.5 thousand.  Urinalysis does show some infection.  Lactate elevated 2.7.  Patient given IV fluid bolus here as well as started on IV Rocephin.  Head CT per interpretation shows no acute findings.  Chest x-ray per my interpretation shows no large infiltrative process.  Patient also has elevated creatinine from dehydration likely resulting in acute kidney injury.  Plan will be for admission to the  hospital.  Family at bedside and informed of current plan and are agreeable  CRITICAL CARE Performed by: Eden Goodpasture Total critical care time: 45 minutes Critical care time was exclusive of  separately billable procedures and treating other patients. Critical care was necessary to treat or prevent imminent or life-threatening deterioration. Critical care was time spent personally by me on the following activities: development of treatment plan with patient and/or surrogate as well as nursing, discussions with consultants, evaluation of patient's response to treatment, examination of patient, obtaining history from patient or surrogate, ordering and performing treatments and interventions, ordering and review of laboratory studies, ordering and review of radiographic studies, pulse oximetry and re-evaluation of patient's condition.         Final Clinical Impression(s) / ED Diagnoses Final diagnoses:  None    Rx / DC Orders ED Discharge Orders     None         Lind Repine, MD 01/20/24 1450    Lind Repine, MD 01/20/24 1451

## 2024-01-20 NOTE — ED Notes (Signed)
 Called [facility] for Transport @15 :32.  Spoke with Mariah Shines

## 2024-01-20 NOTE — H&P (Signed)
 History and Physical    PatientAaron Williams HERMELA HARDT WUJ:[id number] DOB: 02-Jun-1928 DOA: 01/20/2024 DOS: the patient was seen and examined on 01/20/2024 PCP: Yevette Hem, FNP  Patient coming from: [facility] Chief complaint: Generalized weakness. UTI. Left facial droop.   HPI:  [name] Williams is a 89 y.o. female with past medical history  of CLL, CKD stage IIIa, anxiety, depression, GERD, essential hypertension, hypertrophic obstructive cardiomyopathy, hyperlipidemia, history of stroke, allergies to latex, currently on aspirin  81 BuSpar  5, Effexor  37.5 transferred from [facility] for generalized weakness, left facial droop and urinary tract infection.  Per report patient was having leg swelling and was started on Lasix .  ED creatinine was 1.8.  Patient was reported to have aphasia for the past several days with reports of right sided facial droop.  Patient is a limited historian otherwise.  ED Course: Pt in ed at bedside  is alert awake oriented no apparent distress afebrile vitals are stable. Vital signs in the ED were notable for the following:  Vitals:   01/20/24 1200 01/20/24 1500 01/20/24 1548 01/20/24 1646  BP:  100/88  130/61  Pulse:  73  88  Temp:   98 F (36.7 C) 98.1 F (36.7 C)  Resp:  20    Height: 5\' 3"  (1.6 m)     Weight: 56.7 kg     SpO2:  99%  100%  TempSrc:    Oral  BMI (Calculated): 22.15     >>ED evaluation thus far shows: - BMP showing AKI on CKD with a creatinine of 1.80 EGFR of 26 normal electrolytes normal LFTs patient did have elevated BUN 40. -Initial lactic of 2.7 repeat at 1.1. -Leukocytosis of 33.5 hemoglobin of 15.9 normal platelets. -Abnormal urinalysis with large hemoglobin small leukocytes 11-20 WBCs and more than 50 RBCs. - EKG shows sinus rhythm at 72, PR of 137, QTc of 475, abnormal T waves in inferolateral leads and LVH.  EKG however is poor tracing we will repeat. - Chest x-ray negative for any acute fractures or active disease.  >>While in the ED  patient received the following: Medications  0.9 %  sodium chloride  infusion ( Intravenous New Bag/Given 01/20/24 1401)  cefTRIAXone (ROCEPHIN) 2 g in sodium chloride  0.9 % 100 mL IVPB (2 g Intravenous New Bag/Given 01/20/24 1455)  sodium chloride  0.9 % bolus 1,701 mL (1,701 mLs Intravenous New Bag/Given 01/20/24 1501)   Review of Systems  Unable to perform ROS: Age   Past Medical History:  Diagnosis Date   Anxiety    Depressive disorder, not elsewhere classified    Esophageal reflux    Essential hypertension, benign    Hypertr obst cardiomyop    Other and unspecified hyperlipidemia    Stroke (HCC)    Unspecified transient cerebral ischemia    Past Surgical History:  Procedure Laterality Date   BLADDER REPAIR     CERVIX SURGERY     CHOLECYSTECTOMY     TONSILLECTOMY     TUBAL LIGATION      reports that she has been smoking cigarettes. She has never used smokeless tobacco. She reports that she does not drink alcohol and does not use drugs. Allergies  Allergen Reactions   Latex     Per pt 11-10-14 she do not remember being allergic to this.../or   Family History  Problem Relation Age of Onset   Breast cancer Mother    Stomach cancer Sister    Uterine cancer Sister    Depression Sister  Cancer Brother    Alcohol abuse Son    Prior to Admission medications   Medication Sig Start Date End Date Taking? Authorizing Provider  aspirin  EC 81 MG tablet Take 1 tablet (81 mg total) by mouth daily. Swallow whole. 06/10/23   Tommas Fragmin A, FNP  furosemide  (LASIX ) 20 MG tablet Take 1 tablet (20 mg total) by mouth daily. 01/06/24   Gaylyn Keas, Mary-Margaret, FNP  meclizine  (ANTIVERT ) 12.5 MG tablet Take 1 tablet (12.5 mg total) by mouth 3 (three) times daily as needed for dizziness. Patient not taking: Reported on 01/20/2024 08/15/23   Carin Charleston, MD  megestrol  (MEGACE ) 40 MG/ML suspension TAKE 10 MLS (400 MG TOTAL) BY MOUTH 2 (TWO) TIMES DAILY. Patient taking differently: Take 400 mg by  mouth 2 (two) times daily. 12/05/23   Paulett Boros, MD  mirtazapine  (REMERON ) 7.5 MG tablet Take 1 tablet (7.5 mg total) by mouth at bedtime. 08/06/23   Tommas Fragmin A, FNP                                                                               Vitals:   01/20/24 1200 01/20/24 1500 01/20/24 1548 01/20/24 1646  BP:  100/88  130/61  Pulse:  73  88  Resp:  20    Temp:   98 F (36.7 C) 98.1 F (36.7 C)  TempSrc:    Oral  SpO2:  99%  100%  Weight: 56.7 kg     Height: 5\' 3"  (1.6 m)      Physical Exam Vitals and nursing note reviewed.  Constitutional:      General: She is not in acute distress. HENT:     Head: Normocephalic and atraumatic.     Right Ear: Hearing and external ear normal.     Left Ear: Hearing and external ear normal.     Nose: No nasal deformity.     Mouth/Throat:     Lips: Pink.  Eyes:     General: Lids are normal.     Extraocular Movements: Extraocular movements intact.  Cardiovascular:     Rate and Rhythm: Normal rate and regular rhythm.     Heart sounds: Normal heart sounds.  Pulmonary:     Effort: Pulmonary effort is normal.     Breath sounds: Examination of the left-lower field reveals rhonchi. Rhonchi present.  Abdominal:     General: Bowel sounds are normal. There is no distension.     Palpations: Abdomen is soft. There is no mass.     Tenderness: There is no abdominal tenderness.  Musculoskeletal:     Right lower leg: No edema.     Left lower leg: No edema.  Skin:    General: Skin is warm.  Neurological:     General: No focal deficit present.     Mental Status: She is alert and oriented to person, place, and time.     Cranial Nerves: Cranial nerves 2-12 are intact.  Psychiatric:        Speech: Speech normal.     Labs on Admission: I have personally reviewed following labs and imaging studies CBC: Recent Labs  Lab 01/20/24 1212  WBC 33.5*  NEUTROABS 9.2*  HGB 15.9*  HCT 49.0*  MCV 97.8  PLT 284   Basic Metabolic  Panel: Recent Labs  Lab 01/20/24 1212  NA 140  K 4.1  CL 101  CO2 26  GLUCOSE 108*  BUN 40*  CREATININE 1.80*  CALCIUM 9.9   GFR: Estimated Creatinine Clearance: 15.8 mL/min (A) (by C-G formula based on SCr of 1.8 mg/dL (H)). Liver Function Tests: Recent Labs  Lab 01/20/24 1212  AST 27  ALT 13  ALKPHOS 69  BILITOT 0.6  PROT 7.5  ALBUMIN 4.2   No results for input(s): "LIPASE", "AMYLASE" in the last 168 hours. No results for input(s): "AMMONIA" in the last 168 hours. Coagulation Profile: No results for input(s): "INR", "PROTIME" in the last 168 hours. Cardiac Enzymes: No results for input(s): "CKTOTAL", "CKMB", "CKMBINDEX", "TROPONINI" in the last 168 hours. BNP (last 3 results) No results for input(s): "PROBNP" in the last 8760 hours. HbA1C: No results for input(s): "HGBA1C" in the last 72 hours. CBG: No results for input(s): "GLUCAP" in the last 168 hours. Lipid Profile: No results for input(s): "CHOL", "HDL", "LDLCALC", "TRIG", "CHOLHDL", "LDLDIRECT" in the last 72 hours. Thyroid  Function Tests: No results for input(s): "TSH", "T4TOTAL", "FREET4", "T3FREE", "THYROIDAB" in the last 72 hours. Anemia Panel: No results for input(s): "VITAMINB12", "FOLATE", "FERRITIN", "TIBC", "IRON", "RETICCTPCT" in the last 72 hours. Urine analysis:    Component Value Date/Time   COLORURINE YELLOW 01/20/2024 1315   APPEARANCEUR CLEAR 01/20/2024 1315   APPEARANCEUR Cloudy (A) 12/28/2020 1458   LABSPEC 1.025 01/20/2024 1315   PHURINE 5.5 01/20/2024 1315   GLUCOSEU NEGATIVE 01/20/2024 1315   HGBUR LARGE (A) 01/20/2024 1315   BILIRUBINUR NEGATIVE 01/20/2024 1315   BILIRUBINUR Negative 12/28/2020 1458   KETONESUR NEGATIVE 01/20/2024 1315   PROTEINUR 30 (A) 01/20/2024 1315   UROBILINOGEN 0.2 03/31/2010 0843   NITRITE NEGATIVE 01/20/2024 1315   LEUKOCYTESUR SMALL (A) 01/20/2024 1315   Radiological Exams on Admission: DG Chest Port 1 View Result Date: 01/20/2024 CLINICAL DATA:   Shortness of breath EXAM: PORTABLE CHEST 1 VIEW COMPARISON:  Chest x-ray 06/16/2021 FINDINGS: There is some stable strandy opacities in the left lung base favored as scarring. The lungs are otherwise clear. There is no pleural effusion or pneumothorax. The cardiomediastinal silhouette is within normal limits. No acute fractures are seen. IMPRESSION: No active disease. Electronically Signed   By: Tyron Gallon M.D.   On: 01/20/2024 15:12   CT Head Wo Contrast Result Date: 01/20/2024 CLINICAL DATA:  Neuro deficit, concern for stroke. Decreased energy, stumbling and falling, slurred speech for few days. Possible right-sided facial droop starting this morning. EXAM: CT HEAD WITHOUT CONTRAST TECHNIQUE: Contiguous axial images were obtained from the base of the skull through the vertex without intravenous contrast. RADIATION DOSE REDUCTION: This exam was performed according to the departmental dose-optimization program which includes automated exposure control, adjustment of the mA and/or kV according to patient size and/or use of iterative reconstruction technique. COMPARISON:  CT head 08/15/2023. FINDINGS: Brain: No acute intracranial hemorrhage. No CT evidence of acute infarct. Nonspecific hypoattenuation in the periventricular and subcortical white matter favored to reflect chronic microvascular ischemic changes. No edema, mass effect, or midline shift. The basilar cisterns are patent. Ventricles: Prominence of the ventricles suggesting underlying parenchymal volume loss. Vascular: Atherosclerotic calcifications of the carotid siphons and intracranial vertebral arteries. No hyperdense vessel. Skull: No acute or aggressive finding. Orbits: Orbits are symmetric. Sinuses: Secretions in the right sphenoid sinus. Other: Mastoid air cells are clear. IMPRESSION: No  CT evidence of acute intracranial abnormality. Mild chronic microvascular ischemic changes. Mild parenchymal volume loss. Electronically Signed   By: Denny Flack M.D.   On: 01/20/2024 13:53   Data Reviewed: Relevant notes from primary care and specialist visits, past discharge summaries as available in EHR, including Care Everywhere . Prior diagnostic testing as pertinent to current admission diagnoses, Updated medications and problem lists for reconciliation .ED course, including vitals, labs, imaging, treatment and response to treatment,Triage notes, nursing and pharmacy notes and ED provider's notes.Notable results as noted in HPI.Discussed case with EDMD/ ED APP/ or Specialty MD on call and as needed.  Assessment & Plan  >> Generalized weakness: Differentials include secondary to cystitis,deconditioning.    >> Cystitis: Continue rocephin.   >> Aphasia/right facial droop: Will obtain an MRI of the brain noncontrast and carotid Dopplers along with a 2D echocardiogram.  >> Essential hypertension: Vitals:   01/20/24 1158 01/20/24 1500 01/20/24 1646  BP: 130/78 100/88 130/61    >> Lactic acidosis: ? Sepsis or meds or hypotension.  Lactic has resolved.    >> CLL: Patient was last seen by hematology oncology in January 2025. Patient's white count earlier had been 18.2 with normal hemoglobin and platelet count. Suspect her recent rise to be a combination of possible sepsis from cystitis.    DVT prophylaxis:  Heparin.  Consults:  None.  Advance Care Planning:    Code Status: Full Code   Family Communication:  None.  Disposition Plan:  Home.  Severity of Illness: The appropriate patient status for this patient is OBSERVATION. Observation status is judged to be reasonable and necessary in order to provide the required intensity of service to ensure the patient's safety. The patient's presenting symptoms, physical exam findings, and initial radiographic and laboratory data in the context of their medical condition is felt to place them at decreased risk for further clinical deterioration. Furthermore, it is anticipated that the  patient will be medically stable for discharge from the hospital within 2 midnights of admission.   Unresulted Labs (From admission, onward)     Start     Ordered   01/21/24 0500  Comprehensive metabolic panel  Tomorrow morning,   R        01/20/24 1846   01/21/24 0500  CBC  Tomorrow morning,   R        01/20/24 1846   01/21/24 0500  Magnesium  Tomorrow morning,   R        01/20/24 1846   01/20/24 1315  Culture, blood (Routine X 2) w Reflex to ID Panel  BLOOD CULTURE X 2,   STAT     Question:  Patient immune status  Answer:  Normal   01/20/24 1314   01/20/24 1315  Urine Culture  Once,   R        01/20/24 1315            Meds ordered this encounter  Medications   DISCONTD: 0.9 %  sodium chloride  infusion   DISCONTD: 0.9 %  sodium chloride  infusion   DISCONTD: sodium chloride  0.9 % bolus 1,000 mL   DISCONTD: cefTRIAXone (ROCEPHIN) 2 g in sodium chloride  0.9 % 100 mL IVPB    Antibiotic Indication::   UTI   sodium chloride  0.9 % bolus 1,701 mL   heparin injection 5,000 Units   sodium chloride  flush (NS) 0.9 % injection 3 mL   OR Linked Order Group    acetaminophen (TYLENOL) tablet 650 mg  acetaminophen (TYLENOL) suppository 650 mg   cefTRIAXone (ROCEPHIN) 2 g in sodium chloride  0.9 % 100 mL IVPB    Antibiotic Indication::   UTI   0.9 %  sodium chloride  infusion     Orders Placed This Encounter  Procedures   Culture, blood (Routine X 2) w Reflex to ID Panel   Urine Culture   CT Head Wo Contrast   DG Chest Port 1 View   MR BRAIN WO CONTRAST   CBC with Differential   Comprehensive metabolic panel   Lactic acid, plasma   Urinalysis, w/ Reflex to Culture (Infection Suspected) -Urine, Clean Catch   Comprehensive metabolic panel   CBC   Magnesium   Diet NPO time specified   In and Out Cath   Cardiac Monitoring Continuous x 24 hours Indications for use: Acute neurological event   Maintain IV access   Vital signs   Notify physician (specify)   Mobility Protocol: No  Restrictions RN to initiate protocols based on patient's level of care   Refer to Sidebar Report Refer to ICU, Med-Surg, Progressive, and Step-Down Mobility Protocol Sidebars   Initiate Adult Central Line Maintenance and Catheter Protocol for patients with central line (CVC, PICC, Port, Hemodialysis, Trialysis)   Daily weights   Intake and Output   Do not place and if present remove PureWick   Initiate Oral Care Protocol   Initiate Carrier Fluid Protocol   RN may order General Admission PRN Orders utilizing "General Admission PRN medications" (through manage orders) for the following patient needs: allergy symptoms (Claritin), cold sores (Carmex), cough (Robitussin DM), eye irritation (Liquifilm Tears), hemorrhoids (Tucks), indigestion (Maalox), minor skin irritation (Hydrocortisone Cream), muscle pain (Ben Gay), nose irritation (saline nasal spray) and sore throat (Chloraseptic spray).   Patient has an active order for admit to inpatient/place in observation   Cardiac Monitoring Continuous x 48 hours Indications for use: Other; Other indications for use: Generalized weakness AKI.   NIH Stroke Scale   Neuro checks   Full code   Consult to hospitalist   Pulse oximetry check with vital signs   Oxygen therapy Mode or (Route): Nasal cannula; Liters Per Minute: 2; Keep O2 saturation between: greater than 92 %   EKG 12-Lead   ECHOCARDIOGRAM COMPLETE   Place in observation (patient's expected length of stay will be less than 2 midnights)   Aspiration precautions   Fall precautions   VAS US  CAROTID    Author: Lavanda Porter, MD 12 pm- 8 pm. [facility]. 01/20/2024 7:26 PM >>Please note for any concern,or critical results after hours past 8pm please contact the [facility] floor coverage provider from 7 PM- 7 AM. For on call review [email or web address], username [email or web address] and PW: your phone number<<

## 2024-01-20 NOTE — Patient Instructions (Signed)
 Stroke Prevention Some medical conditions and behaviors can lead to a higher chance of having a stroke. You can help prevent a stroke by eating healthy, exercising, not smoking, and managing any medical conditions you have. Stroke is a leading cause of functional impairment. Primary prevention is particularly important because a majority of strokes are first-time events. Stroke changes the lives of not only those who experience a stroke but also their family and other caregivers. How can this condition affect me? A stroke is a medical emergency and should be treated right away. A stroke can lead to brain damage and can sometimes be life-threatening. If a person gets medical treatment right away, there is a better chance of surviving and recovering from a stroke. What can increase my risk? The following medical conditions may increase your risk of a stroke: Cardiovascular disease. High blood pressure (hypertension). Diabetes. High cholesterol. Sickle cell disease. Blood clotting disorders (hypercoagulable state). Obesity. Sleep disorders (obstructive sleep apnea). Other risk factors include: Being older than age 66. Having a history of blood clots, stroke, or mini-stroke (transient ischemic attack, TIA). Genetic factors, such as race, ethnicity, or a family history of stroke. Smoking cigarettes or using other tobacco products. Taking birth control pills, especially if you also use tobacco. Heavy use of alcohol or drugs, especially cocaine and methamphetamine. Physical inactivity. What actions can I take to prevent this? Manage your health conditions High cholesterol levels. Eating a healthy diet is important for preventing high cholesterol. If cholesterol cannot be managed through diet alone, you may need to take medicines. Take any prescribed medicines to control your cholesterol as told by your health care provider. Hypertension. To reduce your risk of stroke, try to keep your blood  pressure below 130/80. Eating a healthy diet and exercising regularly are important for controlling blood pressure. If these steps are not enough to manage your blood pressure, you may need to take medicines. Take any prescribed medicines to control hypertension as told by your health care provider. Ask your health care provider if you should monitor your blood pressure at home. Have your blood pressure checked every year, even if your blood pressure is normal. Blood pressure increases with age and some medical conditions. Diabetes. Eating a healthy diet and exercising regularly are important parts of managing your blood sugar (glucose). If your blood sugar cannot be managed through diet and exercise, you may need to take medicines. Take any prescribed medicines to control your diabetes as told by your health care provider. Get evaluated for obstructive sleep apnea. Talk to your health care provider about getting a sleep evaluation if you snore a lot or have excessive sleepiness. Make sure that any other medical conditions you have, such as atrial fibrillation or atherosclerosis, are managed. Nutrition Follow instructions from your health care provider about what to eat or drink to help manage your health condition. These instructions may include: Reducing your daily calorie intake. Limiting how much salt (sodium) you use to 1,500 milligrams (mg) each day. Using only healthy fats for cooking, such as olive oil, canola oil, or sunflower oil. Eating healthy foods. You can do this by: Choosing foods that are high in fiber, such as whole grains, and fresh fruits and vegetables. Eating at least 5 servings of fruits and vegetables a day. Try to fill one-half of your plate with fruits and vegetables at each meal. Choosing lean protein foods, such as lean cuts of meat, poultry without skin, fish, tofu, beans, and nuts. Eating low-fat dairy products. Avoiding  foods that are high in sodium. This can help  lower blood pressure. Avoiding foods that have saturated fat, trans fat, and cholesterol. This can help prevent high cholesterol. Avoiding processed and prepared foods. Counting your daily carbohydrate intake.  Lifestyle If you drink alcohol: Limit how much you have to: 0-1 drink a day for women who are not pregnant. 0-2 drinks a day for men. Know how much alcohol is in your drink. In the [place] one drink equals one 12 oz bottle of beer ( ), one 5 oz glass of wine ( ), or one 1 oz glass of hard liquor (44mL). Do not use any products that contain nicotine or tobacco. These products include cigarettes, chewing tobacco, and vaping devices, such as e-cigarettes. If you need help quitting, ask your health care provider. Avoid secondhand smoke. Do not use drugs. Activity  Try to stay at a healthy weight. Get at least 30 minutes of exercise on most days, such as: Fast walking. Biking. Swimming. Medicines Take over-the-counter and prescription medicines only as told by your health care provider. Aspirin or blood thinners (antiplatelets or anticoagulants) may be recommended to reduce your risk of forming blood clots that can lead to stroke. Avoid taking birth control pills. Talk to your health care provider about the risks of taking birth control pills if: You are over 71 years old. You smoke. You get very bad headaches. You have had a blood clot. Where to find more information American Stroke Association: www.strokeassociation.org Get help right away if: You or a loved one has any symptoms of a stroke. "BE FAST" is an easy way to remember the main warning signs of a stroke: B - Balance. Signs are dizziness, sudden trouble walking, or loss of balance. E - Eyes. Signs are trouble seeing or a sudden change in vision. F - Face. Signs are sudden weakness or numbness of the face, or the face or eyelid drooping on one side. A - Arms. Signs are weakness or numbness in an arm. This happens  suddenly and usually on one side of the body. S - Speech. Signs are sudden trouble speaking, slurred speech, or trouble understanding what people say. T - Time. Time to call emergency services. Write down what time symptoms started. You or a loved one has other signs of a stroke, such as: A sudden, severe headache with no known cause. Nausea or vomiting. Seizure. These symptoms may represent a serious problem that is an emergency. Do not wait to see if the symptoms will go away. Get medical help right away. Call your local emergency services (911 in the [place] Do not drive yourself to the hospital. Summary You can help to prevent a stroke by eating healthy, exercising, not smoking, limiting alcohol intake, and managing any medical conditions you may have. Do not use any products that contain nicotine or tobacco. These include cigarettes, chewing tobacco, and vaping devices, such as e-cigarettes. If you need help quitting, ask your health care provider. Remember "BE FAST" for warning signs of a stroke. Get help right away if you or a loved one has any of these signs. This information is not intended to replace advice given to you by your health care provider. Make sure you discuss any questions you have with your health care provider. Document Revised: 07/09/2022 Document Reviewed: 07/09/2022 Elsevier Patient Education  2024 [employer].

## 2024-01-20 NOTE — Progress Notes (Signed)
 Subjective:    Patient ID: [name] Williams, female    DOB: 07/31/1928, 89 y.o.   MRN: [id number]  Chief Complaint  Patient presents with   Fall   Aphasia   Facial Drooping   Fatigue   PT presents to the office today with "not feeling good". Daughter is with her today. States she noticed Friday she was "slow talking", but this morning when she picked up her she has noticed right facial drooping. States she has been weak and "fallen into the wall" several times over the weekend.   She saw an acute provider on 01/06/24 and had an BNP of 651 and started on Lasix  20 mg daily. She has lost 4 lbs since her last visit. She was started on megace  from her hematologists and family reports her appetite has greatly improved with this.      01/06/2024    2:11 PM 09/05/2023    2:13 PM 08/15/2023    2:52 PM  Last 3 Weights  Weight (lbs) 126 lb 98 lb 5.2 oz 100 lb  Weight (kg) 57.153 kg 44.6 kg 45.36 kg     Fall The accident occurred 2 days ago.  Congestive Heart Failure Presents for initial visit. Associated symptoms include edema and fatigue. Pertinent negatives include no shortness of breath. The symptoms have been worsening.      Review of Systems  Constitutional:  Positive for fatigue.  Respiratory:  Negative for shortness of breath.   All other systems reviewed and are negative.   Social History   Socioeconomic History   Marital status: Widowed    Spouse name: Not on file   Number of children: 3   Years of education: Not on file   Highest education level: 9th grade  Occupational History   Not on file  Tobacco Use   Smoking status: Some Days    Types: Cigarettes   Smokeless tobacco: Never   Tobacco comments:    quit 2012  Vaping Use   Vaping status: Never Used  Substance and Sexual Activity   Alcohol use: No   Drug use: No   Sexual activity: Not Currently  Other Topics Concern   Not on file  Social History Narrative   Patient is retired.    Patient is widowed.     Patient has 3 children.    Patient is right handed.    Pt lives with son.     Social Drivers of Corporate investment banker Strain: Low Risk  (07/21/2018)   Overall Financial Resource Strain (CARDIA)    Difficulty of Paying Living Expenses: Not hard at all  Food Insecurity: No Food Insecurity (07/21/2018)   Hunger Vital Sign    Worried About Running Out of Food in the Last Year: Never true    Ran Out of Food in the Last Year: Never true  Transportation Needs: No Transportation Needs (11/01/2020)   PRAPARE - Administrator, Civil Service (Medical): No    Lack of Transportation (Non-Medical): No  Physical Activity: Inactive (11/16/2021)   Exercise Vital Sign    Days of Exercise per Week: 0 days    Minutes of Exercise per Session: 0 min  Stress: Stress Concern Present (11/16/2021)   [employer] of Occupational Health - Occupational Stress Questionnaire    Feeling of Stress : To some extent  Social Connections: Moderately Integrated (07/21/2018)   Social Connection and Isolation Panel [NHANES]    Frequency of Communication with Friends and Family: Once  a week    Frequency of Social Gatherings with Friends and Family: More than three times a week    Attends Religious Services: More than 4 times per year    Active Member of Clubs or Organizations: Yes    Attends Banker Meetings: More than 4 times per year    Marital Status: Widowed   Family History  Problem Relation Age of Onset   Breast cancer Mother    Stomach cancer Sister    Uterine cancer Sister    Depression Sister    Cancer Brother    Alcohol abuse Son         Objective:   Physical Exam Vitals reviewed.  Constitutional:      General: She is not in acute distress.    Appearance: She is well-developed.  HENT:     Head: Normocephalic and atraumatic.     Comments: Right facial droop    Right Ear: Tympanic membrane normal.     Left Ear: Tympanic membrane normal.  Eyes:     Pupils: Pupils  are equal, round, and reactive to light.  Neck:     Thyroid : No thyromegaly.  Cardiovascular:     Rate and Rhythm: Normal rate and regular rhythm.     Heart sounds: Normal heart sounds. No murmur heard. Pulmonary:     Effort: Pulmonary effort is normal. No respiratory distress.     Breath sounds: Normal breath sounds. No wheezing.  Abdominal:     General: Bowel sounds are normal. There is no distension.     Palpations: Abdomen is soft.     Tenderness: There is no abdominal tenderness.  Musculoskeletal:        General: No tenderness. Normal range of motion.     Cervical back: Normal range of motion and neck supple.  Skin:    General: Skin is warm and dry.  Neurological:     Mental Status: She is alert and oriented to person, place, and time.     Cranial Nerves: No cranial nerve deficit.     Deep Tendon Reflexes: Reflexes are normal and symmetric.     Comments: Equal strength bilaterally   Psychiatric:        Speech: Speech is slurred.        Behavior: Behavior normal.        Thought Content: Thought content normal.        Judgment: Judgment normal.       BP 129/85   Pulse 78   Temp 98 F (36.7 C)   SpO2 96%      Assessment & Plan:  [name] Williams comes in today with chief complaint of Fall, Aphasia, Facial Drooping, and Fatigue   Diagnosis and orders addressed:  1. Facial droop (Primary) - MR Brain Wo Contrast; Future - CMP14+EGFR - CBC with Differential/Platelet  2. Congenital heart failure (HCC) - MR Brain Wo Contrast; Future - CMP14+EGFR - CBC with Differential/Platelet - Brain natriuretic peptide  3. Weakness - MR Brain Wo Contrast; Future - CMP14+EGFR - CBC with Differential/Platelet  4. Frequent falls - MR Brain Wo Contrast; Future - CMP14+EGFR - CBC with Differential/Platelet   Labs pending Continue current medications  Stat MRI pending Family states they are going to take patient to ED Discussed SNF placement with weakness  Approx 40 mins  spent with patient, family, chart review, and education  Tommas Fragmin, FNP

## 2024-01-21 ENCOUNTER — Observation Stay (HOSPITAL_BASED_OUTPATIENT_CLINIC_OR_DEPARTMENT_OTHER)

## 2024-01-21 DIAGNOSIS — E869 Volume depletion, unspecified: Secondary | ICD-10-CM | POA: Diagnosis not present

## 2024-01-21 DIAGNOSIS — E860 Dehydration: Secondary | ICD-10-CM | POA: Diagnosis not present

## 2024-01-21 DIAGNOSIS — I639 Cerebral infarction, unspecified: Secondary | ICD-10-CM | POA: Diagnosis not present

## 2024-01-21 LAB — COMPREHENSIVE METABOLIC PANEL WITH GFR
ALT: 10 U/L (ref 0–44)
AST: 20 U/L (ref 15–41)
Albumin: 2.8 g/dL — ABNORMAL LOW (ref 3.5–5.0)
Alkaline Phosphatase: 39 U/L (ref 38–126)
Anion gap: 4 — ABNORMAL LOW (ref 5–15)
BUN: 27 mg/dL — ABNORMAL HIGH (ref 8–23)
CO2: 26 mmol/L (ref 22–32)
Calcium: 8.2 mg/dL — ABNORMAL LOW (ref 8.9–10.3)
Chloride: 109 mmol/L (ref 98–111)
Creatinine, Ser: 1.45 mg/dL — ABNORMAL HIGH (ref 0.44–1.00)
GFR, Estimated: 33 mL/min — ABNORMAL LOW (ref >60)
Glucose, Bld: 92 mg/dL (ref 70–99)
Potassium: 4.6 mmol/L (ref 3.5–5.1)
Sodium: 139 mmol/L (ref 135–145)
Total Bilirubin: 1.1 mg/dL (ref 0.0–1.2)
Total Protein: 5.2 g/dL — ABNORMAL LOW (ref 6.5–8.1)

## 2024-01-21 LAB — CBC
HCT: 42.4 % (ref 36.0–46.0)
Hemoglobin: 13.7 g/dL (ref 12.0–15.0)
MCH: 31.6 pg (ref 26.0–34.0)
MCHC: 32.3 g/dL (ref 30.0–36.0)
MCV: 97.9 fL (ref 80.0–100.0)
Platelets: 257 10*3/uL (ref 150–400)
RBC: 4.33 MIL/uL (ref 3.87–5.11)
RDW: 12.8 % (ref 11.5–15.5)
WBC: 34.5 10*3/uL — ABNORMAL HIGH (ref 4.0–10.5)
nRBC: 0 % (ref 0.0–0.2)

## 2024-01-21 LAB — ECHOCARDIOGRAM COMPLETE
AR max vel: 2.03 cm2
AV Peak grad: 8 mmHg
Ao pk vel: 1.41 m/s
Area-P 1/2: 2.69 cm2
Height: 63 in
S' Lateral: 2.2 cm
Weight: 2017.65 [oz_av]

## 2024-01-21 LAB — MAGNESIUM: Magnesium: 2.3 mg/dL (ref 1.7–2.4)

## 2024-01-21 MED ORDER — ASPIRIN 81 MG PO TBEC
81.0000 mg | DELAYED_RELEASE_TABLET | Freq: Every day | ORAL | Status: DC
  Administered 2024-01-21 – 2024-01-22 (×2): 81 mg via ORAL
  Filled 2024-01-21 (×2): qty 1

## 2024-01-21 MED ORDER — MIRTAZAPINE 15 MG PO TABS
7.5000 mg | ORAL_TABLET | Freq: Every day | ORAL | Status: DC
  Administered 2024-01-21: 7.5 mg via ORAL
  Filled 2024-01-21: qty 1

## 2024-01-21 MED ORDER — SODIUM CHLORIDE 0.9 % IV SOLN: INTRAVENOUS | Status: AC

## 2024-01-21 MED ORDER — PERFLUTREN LIPID MICROSPHERE
1.0000 mL | INTRAVENOUS | Status: AC | PRN
  Administered 2024-01-21: 3 mL via INTRAVENOUS

## 2024-01-21 NOTE — Plan of Care (Signed)
 Assumed care at 1900. Pt is Aox3, not to situation. Pt has been resting comfortably in bed overnight. Pt has no pain, see MAR. Pt ambulates with stand-by assistance to bathroom. Pt loss IV access but was able to get new site placed. Fall precautions in place. No significant events overnight.    Problem: Education: Goal: Knowledge of General Education information will improve Description: Including pain rating scale, medication(s)/side effects and non-pharmacologic comfort measures Outcome: Progressing   Problem: Clinical Measurements: Goal: Ability to maintain clinical measurements within normal limits will improve Outcome: Progressing Goal: Will remain free from infection Outcome: Progressing Goal: Diagnostic test results will improve Outcome: Progressing Goal: Respiratory complications will improve Outcome: Progressing Goal: Cardiovascular complication will be avoided Outcome: Progressing   Problem: Activity: Goal: Risk for activity intolerance will decrease Outcome: Progressing   Problem: Elimination: Goal: Will not experience complications related to bowel motility Outcome: Progressing Goal: Will not experience complications related to urinary retention Outcome: Progressing   Problem: Pain Managment: Goal: General experience of comfort will improve and/or be controlled Outcome: Progressing   Problem: Safety: Goal: Ability to remain free from injury will improve Outcome: Progressing   Problem: Skin Integrity: Goal: Risk for impaired skin integrity will decrease Outcome: Progressing

## 2024-01-21 NOTE — Evaluation (Signed)
 Occupational Therapy Evaluation Patient Details Name: [name] Williams MRN: [id number] DOB: Mar 22, 1928 Today's Date: 01/21/2024   History of Present Illness   89 y.o. female adm 01/20/24 with UTI, weakness, facial droop. MRI (-). PMhx:  CLL, CKD, anxiety, depression, GERD, HTN, hypertrophic obstructive cardiomyopathy, HLD, CVA     Clinical Impressions Pt reports ind at baseline with ADLs/functional mobility, lives with her son who is disabled. Pt currently needing CGA-min A for ADLs, min A for bed mobility and CGA for transfers without AD. Pt reports some intermittent speech/word finding deficits, and noted some cognitive deficits but may be close to baseline. Overall she feels she is still weaker than her baseline. Pt presenting with impairments listed below, will follow acutely. Recommend HHOT at d/c.     If plan is discharge home, recommend the following:   A little help with walking and/or transfers;A little help with bathing/dressing/bathroom;Assistance with cooking/housework;Direct supervision/assist for medications management;Direct supervision/assist for financial management;Assist for transportation;Help with stairs or ramp for entrance     Functional Status Assessment   Patient has had a recent decline in their functional status and demonstrates the ability to make significant improvements in function in a reasonable and predictable amount of time.     Equipment Recommendations   None recommended by OT     Recommendations for Other Services   PT consult     Precautions/Restrictions   Precautions Precautions: Fall Restrictions Weight Bearing Restrictions Per Provider Order: No     Mobility Bed Mobility Overal bed mobility: Needs Assistance Bed Mobility: Supine to Sit     Supine to sit: Supervision          Transfers Overall transfer level: Needs assistance Equipment used: None Transfers: Sit to/from Stand Sit to Stand: Contact guard assist                   Balance Overall balance assessment: Mild deficits observed, not formally tested                                         ADL either performed or assessed with clinical judgement   ADL Overall ADL's : Needs assistance/impaired Eating/Feeding: Set up;Sitting   Grooming: Oral care;Brushing hair;Set up;Standing   Upper Body Bathing: Minimal assistance;Sitting;Standing   Lower Body Bathing: Minimal assistance;Sitting/lateral leans;Sit to/from stand   Upper Body Dressing : Sitting;Standing;Contact guard assist   Lower Body Dressing: Contact guard assist;Sit to/from stand;Sitting/lateral leans   Toilet Transfer: Contact guard assist;Ambulation   Toileting- Clothing Manipulation and Hygiene: Contact guard assist       Functional mobility during ADLs: Contact guard assist       Vision Baseline Vision/History: 1 Wears glasses Vision Assessment?: No apparent visual deficits Additional Comments: wears glasses but not currently present at the hospital, appears [facility] for BADL /mobility tasks     Perception Perception: Not tested       Praxis Praxis: Not tested       Pertinent Vitals/Pain Pain Assessment Pain Assessment: No/denies pain     Extremity/Trunk Assessment Upper Extremity Assessment Upper Extremity Assessment: Generalized weakness   Lower Extremity Assessment Lower Extremity Assessment: Defer to PT evaluation       Communication Communication Communication: Other (comment);Impaired (reports still some wordfinding difficulties and speech isn't "100% back to normal") Factors Affecting Communication: Hearing impaired   Cognition Arousal: Alert Behavior During Therapy: WFL for tasks assessed/performed Cognition: No  family/caregiver present to determine baseline, Cognition impaired   Orientation impairments: Situation (unsure of why she is at the hospital but recalls with cues) Awareness: Online awareness impaired Memory impairment  (select all impairments): Short-term memory                       Following commands: Intact       Cueing  General Comments   Cueing Techniques: Verbal cues  VSS   Exercises     Shoulder Instructions      Home Living Family/patient expects to be discharged to:: Private residence Living Arrangements: Children (son lives in the basement) Available Help at Discharge: Family;Available 24 hours/day (son is disabled) Type of Home: House Home Access: Stairs to enter [employer] of Steps: 10-12 Entrance Stairs-Rails: Can reach both Home Layout: One level;Laundry or work area in basement Counselling psychologist)     Bathroom Shower/Tub: Runner, broadcasting/film/video: None   Additional Comments: has cane but it is currently being used by her son      Prior Functioning/Environment Prior Level of Function : Independent/Modified Independent;Driving             Mobility Comments: ind, no AD ADLs Comments: ind; cooks all the meals in her household    OT Problem List: Decreased strength;Decreased range of motion;Decreased activity tolerance;Decreased cognition;Decreased coordination   OT Treatment/Interventions: Self-care/ADL training;Therapeutic exercise;Energy conservation;DME and/or AE instruction;Therapeutic activities;Balance training;Patient/family education;Cognitive remediation/compensation;Visual/perceptual remediation/compensation      OT Goals(Current goals can be found in the care plan section)   Acute Rehab OT Goals Patient Stated Goal: to go home OT Goal Formulation: With patient Time For Goal Achievement: 02/04/24 Potential to Achieve Goals: Good ADL Goals Pt Will Perform Upper Body Dressing: Independently;sitting Pt Will Perform Lower Body Dressing: Independently;sitting/lateral leans;sit to/from stand Pt Will Transfer to Toilet: with supervision;ambulating;regular height toilet Pt Will Perform Tub/Shower Transfer: Shower transfer;with  supervision;ambulating Additional ADL Goal #1: pt will tolerate OOB activity x10 min in order to improve activity tolerance for ADLs   OT Frequency:  Min 1X/week    Co-evaluation              AM-PAC OT "6 Clicks" Daily Activity     Outcome Measure Help from another person eating meals?: None Help from another person taking care of personal grooming?: A Little Help from another person toileting, which includes using toliet, bedpan, or urinal?: A Little Help from another person bathing (including washing, rinsing, drying)?: A Little Help from another person to put on and taking off regular upper body clothing?: A Little Help from another person to put on and taking off regular lower body clothing?: A Little 6 Click Score: 19   End of Session Nurse Communication: Mobility status  Activity Tolerance: Patient tolerated treatment well Patient left: in chair;with call bell/phone within reach;with chair alarm set  OT Visit Diagnosis: Unsteadiness on feet (R26.81);Other abnormalities of gait and mobility (R26.89)                Time: 1308-6578 OT Time Calculation (min): 21 min Charges:  OT General Charges $OT Visit: 1 Visit OT Evaluation $OT Eval Low Complexity: 1 Low  Benedict Brain, OTD, OTR/L SecureChat Preferred Acute Rehab [phone]   Chairty Toman K Koonce 01/21/2024, 9:56 AM

## 2024-01-21 NOTE — Evaluation (Signed)
 Physical Therapy Evaluation Patient Details Name: [name] Williams MRN: [id number] DOB: 11/11/1927 Today's Date: 01/21/2024  History of Present Illness  89 y.o. female adm 01/20/24 with UTI, weakness, facial droop. MRI (-). PMhx:  CLL, CKD, anxiety, depression, GERD, HTN, hypertrophic obstructive cardiomyopathy, HLD, CVA  Clinical Impression  Pt pleasant with apparent food still in pt mouth and needing cues to spit out remainder. Pt with decreased awareness, memory, problem solving who states she drives, cooks and walks independently at baseline. Pt reports she lives with 67yo "disabled son" who cannot assist her and grandson stops in on occasion. Pt with balance and cognitive deficits who should have supervision for safety at D/C as well as HHPT to assess balance and function in home since pt with varied reports of stairs and home setup. Will follow acutely to maximize mobility and safety to decrease burden of care.        If plan is discharge home, recommend the following: Supervision due to cognitive status   Can travel by private vehicle        Equipment Recommendations None recommended by PT  Recommendations for Other Services       Functional Status Assessment Patient has had a recent decline in their functional status and/or demonstrates limited ability to make significant improvements in function in a reasonable and predictable amount of time     Precautions / Restrictions Precautions Precautions: Fall      Mobility  Bed Mobility               General bed mobility comments: in chair on arrival and end of session    Transfers Overall transfer level: Needs assistance   Transfers: Sit to/from Stand Sit to Stand: Supervision           General transfer comment: supervision for safety    Ambulation/Gait Ambulation/Gait assistance: Contact guard assist Gait Distance (Feet): 200 Feet Assistive device: None Gait Pattern/deviations: Step-through pattern, Decreased  stride length, Drifts right/left   Gait velocity interpretation: 1.31 - 2.62 ft/sec, indicative of limited community ambulator   General Gait Details: pt with veering right during gait with CGA for stability and safety, cues for direction and wayfinding  Stairs Stairs: Yes Stairs assistance: Supervision Stair Management: Alternating pattern, Forwards, One rail Right Number of Stairs: 11    Wheelchair Mobility     Tilt Bed    Modified Rankin (Stroke Patients Only)       Balance Overall balance assessment: Needs assistance   Sitting balance-Leahy Scale: Fair     Standing balance support: No upper extremity supported Standing balance-Leahy Scale: Good                               Pertinent Vitals/Pain Pain Assessment Pain Assessment: No/denies pain    Home Living Family/patient expects to be discharged to:: Private residence Living Arrangements: Children (son lives in the basement) Available Help at Discharge: Family;Available PRN/intermittently (son is disabled) Type of Home: House Home Access: Stairs to enter Entrance Stairs-Rails: Can reach both Entrance Stairs-Number of Steps: 1 Alternate Level Stairs-Number of Steps: 14 Home Layout: Laundry or work area in basement;Two level (flight) Home Equipment: None Additional Comments: has cane but it is currently being used by her son. son lives in the basement and pt at times takes meals to him    Prior Function Prior Level of Function : Independent/Modified Independent;Driving  Mobility Comments: ind, no AD ADLs Comments: ind; cooks all the meals in her household     Extremity/Trunk Assessment   Upper Extremity Assessment Upper Extremity Assessment: Generalized weakness    Lower Extremity Assessment Lower Extremity Assessment: Generalized weakness    Cervical / Trunk Assessment Cervical / Trunk Assessment: Normal  Communication   Communication Communication:  Impaired Factors Affecting Communication: Hearing impaired    Cognition Arousal: Alert Behavior During Therapy: WFL for tasks assessed/performed   PT - Cognitive impairments: Orientation, Safety/Judgement, Problem solving, Memory, No family/caregiver present to determine baseline                       PT - Cognition Comments: pt initially stating [name] Williams as president then eventually changed to [name] Williams, not oriented to day, could not way find or recall room number despite repeated education and signage Following commands: Intact       Cueing Cueing Techniques: Verbal cues     General Comments General comments (skin integrity, edema, etc.): VSS    Exercises     Assessment/Plan    PT Assessment Patient needs continued PT services  PT Problem List Decreased coordination;Decreased cognition;Decreased activity tolerance;Decreased balance;Decreased safety awareness;Decreased mobility       PT Treatment Interventions Gait training;DME instruction;Stair training;Functional mobility training;Therapeutic activities;Therapeutic exercise;Patient/family education;Cognitive remediation;Balance training    PT Goals (Current goals can be found in the Care Plan section)  Acute Rehab PT Goals Patient Stated Goal: return home PT Goal Formulation: With patient Time For Goal Achievement: 02/04/24 Potential to Achieve Goals: Fair    Frequency Min 2X/week     Co-evaluation               AM-PAC PT "6 Clicks" Mobility  Outcome Measure Help needed turning from your back to your side while in a flat bed without using bedrails?: None Help needed moving from lying on your back to sitting on the side of a flat bed without using bedrails?: None Help needed moving to and from a bed to a chair (including a wheelchair)?: A Little Help needed standing up from a chair using your arms (e.g., wheelchair or bedside chair)?: A Little Help needed to walk in hospital room?: A Little Help needed  climbing 3-5 steps with a railing? : A Little 6 Click Score: 20    End of Session   Activity Tolerance: Patient tolerated treatment well Patient left: in chair;with call bell/phone within reach;with chair alarm set Nurse Communication: Mobility status PT Visit Diagnosis: Other abnormalities of gait and mobility (R26.89);Difficulty in walking, not elsewhere classified (R26.2)    Time: 1610-9604 PT Time Calculation (min) (ACUTE ONLY): 18 min   Charges:   PT Evaluation $PT Eval Moderate Complexity: 1 Mod   PT General Charges $$ ACUTE PT VISIT: 1 Visit         Annis Baseman, PT Acute Rehabilitation Services Office: [phone]   [name] Williams 01/21/2024, 12:29 PM

## 2024-01-21 NOTE — Care Management Obs Status (Signed)
 MEDICARE OBSERVATION STATUS NOTIFICATION   Patient Details  Name: [name] Williams MRN: [id number] Date of Birth: 05-29-1928   Medicare Observation Status Notification Given:     Moon/Obs letter signed and copy given  Wynonia Hedges 01/21/2024, 9:56 AM

## 2024-01-21 NOTE — Care Management Obs Status (Signed)
 MEDICARE OBSERVATION STATUS NOTIFICATION   Patient Details  Name: [name] Williams MRN: [id number] Date of Birth: June 01, 1928   Medicare Observation Status Notification Given:  Yes  Moon/Obs letter signed and copy given   Wynonia Hedges 01/21/2024, 3:19 PM

## 2024-01-21 NOTE — Progress Notes (Signed)
 Echocardiogram attempted, patient care is currently in progress. Will try again later.

## 2024-01-21 NOTE — Progress Notes (Signed)
  Echocardiogram 2D Echocardiogram has been performed.  [name] Williams 01/21/2024, 12:13 PM

## 2024-01-21 NOTE — Progress Notes (Signed)
 Transition of Care [facility]) - Inpatient Brief Assessment   Patient Details  Name: [name] Williams MRN: [id number] Date of Birth: [birth year]  Transition of Care [facility] CM/SW Contact:    Dane Dung, RN Phone Number: 01/21/2024, 2:03 PM   Clinical Narrative: CM met with the patient at the bedside and patient is oriented x 4 and plans to return home with home health.  Patient lives at the home with her son, [name] Williams but states that she has another son, [name] Williams and grandson in the area that are able to assist.  Patient was agreeable to home health services and was offered Medicare choice regarding home health and patient did not have a preference.  I placed HH orders to be co-signed by the MD.  I called [facility] and decision for acceptance is pending at this time.   Transition of Care Asessment: Insurance and Status: (P) Insurance coverage has been reviewed Patient has primary care physician: (P) Yes Home environment has been reviewed: (P) from home with son, [name] Williams, at the home Prior level of function:: (P) Independent Prior/Current Home Services: (P) No current home services Social Drivers of Health Review: (P) SDOH reviewed interventions complete Readmission risk has been reviewed: (P) Yes Transition of care needs: (P) transition of care needs identified, TOC will continue to follow

## 2024-01-21 NOTE — Progress Notes (Signed)
 PROGRESS NOTE    SHANIE MAUZY  ZOX:[id number] DOB: 09/23/1927 DOA: 01/20/2024 PCP: Yevette Hem, FNP   Brief Narrative:  [name] Williams is a 89 y.o. female with past medical history  of CLL, CKD stage IIIa, anxiety, depression, GERD, essential hypertension, hypertrophic obstructive cardiomyopathy, hyperlipidemia, history of stroke, transferred from [facility] for generalized weakness, left facial droop and urinary tract infection.    Assessment & Plan:   Principal Problem:   Dehydration Active Problems:   HYPERTENSION, BENIGN  Generalized weakness: Patient has no complaint of weakness today, feels much better.  I have consulted PT OT.  Reports of right facial droop and aphasia by family: This was also noted in the ED at [facility] but not by the admitting hospitalist.  CT head in the ED was negative and regardless of that, due to concern of stroke, MRI was ordered which was unremarkable as well.  She does not appear to have any facial droop today.  She is fluent in speech.  Acute encephalopathy: Reportedly, patient was unable to provide history and she was poor historian however today during my evaluation, she is fully alert and oriented but she just does not know who brought her to the hospital and why.  She tells me that she lives alone, her son lives in the basement and he is " alcoholic" and they both do not communicate much with each other.  UTI: Continue Rocephin and follow culture.  Lactic acidosis/dehydration/hypotension/AKI on CKD stage IIIa: Baseline creatinine 1.2, present 1.8.  Lactic acidosis resolved, AKI improving and so is dehydration but still slightly dehydrated clinically so I will give her another 1.5 L of fluid over the next 10 hours and repeat BMP in the morning.  Avoid nephrotoxic agents, hold Lasix .  Blood pressure improving now.  CLL: Last seen by oncology in January 2025.  At baseline, white cells are typically around 18-20 but currently around 38, likely due to  active infection.  No fever.  Monitor closely and repeat labs in the morning.  DVT prophylaxis: heparin injection 5,000 Units Start: 01/20/24 2200   Code Status: Full Code  Family Communication:  None present at bedside.  Plan of care discussed with patient in length and he/she verbalized understanding and agreed with it.  Status is: Observation The patient will require care spanning > 2 midnights and should be moved to inpatient because: Needs more IV fluids and assessment by PT OT.   Estimated body mass index is 22.34 kg/m as calculated from the following:   Height as of this encounter: 5\' 3"  (1.6 m).   Weight as of this encounter: 57.2 kg.    Nutritional Assessment: Body mass index is 22.34 kg/m.Aaron Aas Seen by dietician.  I agree with the assessment and plan as outlined below: Nutrition Status:        . Skin Assessment: I have examined the patient's skin and I agree with the wound assessment as performed by the wound care RN as outlined below:    Consultants:  None  Procedures:  None  Antimicrobials:  Anti-infectives (From admission, onward)    Start     Dose/Rate Route Frequency Ordered Stop   01/21/24 1500  cefTRIAXone (ROCEPHIN) 2 g in sodium chloride  0.9 % 100 mL IVPB        2 g 200 mL/hr over 30 Minutes Intravenous Every 24 hours 01/20/24 1847 01/24/24 1459   01/20/24 1500  cefTRIAXone (ROCEPHIN) 2 g in sodium chloride  0.9 % 100 mL IVPB  Status:  Discontinued        2 g 200 mL/hr over 30 Minutes Intravenous Every 24 hours 01/20/24 1446 01/20/24 1847         Subjective: Patient seen and examined, she is fully alert and oriented.  She has no complaints.  Objective: Vitals:   01/21/24 0051 01/21/24 0437 01/21/24 0500 01/21/24 0700  BP: 113/69  (!) 109/56 (!) 148/84  Pulse: 69   82  Resp: 19 18    Temp: 98.7 F (37.1 C) 98.7 F (37.1 C)  97.8 F (36.6 C)  TempSrc:  Oral  Oral  SpO2: 98% 97%  95%  Weight:  57.2 kg    Height:       No intake or output  data in the 24 hours ending 01/21/24 0929 Filed Weights   01/20/24 1200 01/21/24 0437  Weight: 56.7 kg 57.2 kg    Examination:  General exam: Appears calm and comfortable, slightly dehydrated clinically Respiratory system: Clear to auscultation. Respiratory effort normal. Cardiovascular system: S1 & S2 heard, RRR. No JVD, murmurs, rubs, gallops or clicks. No pedal edema. Gastrointestinal system: Abdomen is nondistended, soft and nontender. No organomegaly or masses felt. Normal bowel sounds heard. Central nervous system: Alert and oriented. No focal neurological deficits. Extremities: Symmetric 5 x 5 power. Skin: No rashes, lesions or ulcers Psychiatry: Judgement and insight appear normal. Mood & affect appropriate.    Data Reviewed: I have personally reviewed following labs and imaging studies  CBC: Recent Labs  Lab 01/20/24 1212 01/21/24 0533  WBC 33.5* 34.5*  NEUTROABS 9.2*  --   HGB 15.9* 13.7  HCT 49.0* 42.4  MCV 97.8 97.9  PLT 284 257   Basic Metabolic Panel: Recent Labs  Lab 01/20/24 1212 01/21/24 0533  NA 140 139  K 4.1 4.6  CL 101 109  CO2 26 26  GLUCOSE 108* 92  BUN 40* 27*  CREATININE 1.80* 1.45*  CALCIUM 9.9 8.2*  MG  --  2.3   GFR: Estimated Creatinine Clearance: 19.6 mL/min (A) (by C-G formula based on SCr of 1.45 mg/dL (H)). Liver Function Tests: Recent Labs  Lab 01/20/24 1212 01/21/24 0533  AST 27 20  ALT 13 10  ALKPHOS 69 39  BILITOT 0.6 1.1  PROT 7.5 5.2*  ALBUMIN 4.2 2.8*   No results for input(s): "LIPASE", "AMYLASE" in the last 168 hours. No results for input(s): "AMMONIA" in the last 168 hours. Coagulation Profile: No results for input(s): "INR", "PROTIME" in the last 168 hours. Cardiac Enzymes: No results for input(s): "CKTOTAL", "CKMB", "CKMBINDEX", "TROPONINI" in the last 168 hours. BNP (last 3 results) No results for input(s): "PROBNP" in the last 8760 hours. HbA1C: No results for input(s): "HGBA1C" in the last 72  hours. CBG: No results for input(s): "GLUCAP" in the last 168 hours. Lipid Profile: No results for input(s): "CHOL", "HDL", "LDLCALC", "TRIG", "CHOLHDL", "LDLDIRECT" in the last 72 hours. Thyroid  Function Tests: No results for input(s): "TSH", "T4TOTAL", "FREET4", "T3FREE", "THYROIDAB" in the last 72 hours. Anemia Panel: No results for input(s): "VITAMINB12", "FOLATE", "FERRITIN", "TIBC", "IRON", "RETICCTPCT" in the last 72 hours. Sepsis Labs: Recent Labs  Lab 01/20/24 1315 01/20/24 1529  LATICACIDVEN 2.7* 1.1    Recent Results (from the past 240 hours)  Culture, blood (Routine X 2) w Reflex to ID Panel     Status: None (Preliminary result)   Collection Time: 01/20/24  1:15 PM   Specimen: BLOOD  Result Value Ref Range Status   Specimen Description   Final  BLOOD LEFT ANTECUBITAL Performed at [facility], [address] [place], [place] [address]    Special Requests   Final    BOTTLES DRAWN AEROBIC AND ANAEROBIC Blood Culture adequate volume Performed at [facility], [address], [place], [place] [address]    Culture   Final    NO GROWTH < 24 HOURS Performed at [facility], [address], [place], [place] [address]    Report Status PENDING  Incomplete  Culture, blood (Routine X 2) w Reflex to ID Panel     Status: None (Preliminary result)   Collection Time: 01/20/24  1:20 PM   Specimen: BLOOD  Result Value Ref Range Status   Specimen Description   Final    BLOOD RIGHT ANTECUBITAL Performed at [facility], [address], [place], [place] [address]    Special Requests   Final    BOTTLES DRAWN AEROBIC AND ANAEROBIC Blood Culture adequate volume Performed at [facility], [address] [place], [place] [address]    Culture   Final    NO GROWTH < 24 HOURS Performed at [facility], [address], [place], [place] [address]    Report Status PENDING  Incomplete      Radiology Studies: MR BRAIN WO CONTRAST Result Date: 01/21/2024 CLINICAL DATA:  Mental status change, unknown cause EXAM: MRI HEAD WITHOUT CONTRAST TECHNIQUE: Multiplanar, multiecho pulse sequences of the brain and surrounding structures were obtained without intravenous contrast. COMPARISON:  CT head from earlier today.  MRI head February 13, 2019. FINDINGS: Brain: No acute infarction, hemorrhage, hydrocephalus, extra-axial collection or mass lesion. Cerebral atrophy. Vascular: Major arterial flow voids are maintained. Skull and upper cervical spine: Normal marrow signal. Sinuses/Orbits: Clear sinuses.  No acute orbital findings. Other: No mastoid effusions. IMPRESSION: No evidence of acute intracranial abnormality. Electronically Signed   By: Stevenson Elbe M.D.   On: 01/21/2024 03:08   DG Chest Port 1 View Result Date: 01/20/2024 CLINICAL DATA:  Shortness of breath EXAM: PORTABLE CHEST 1 VIEW COMPARISON:  Chest x-ray 06/16/2021 FINDINGS: There is some stable strandy opacities in the left lung base favored as scarring. The lungs are otherwise clear. There is no pleural effusion or pneumothorax. The cardiomediastinal silhouette is within normal limits. No acute fractures are seen. IMPRESSION: No active disease. Electronically Signed   By: Tyron Gallon M.D.   On: 01/20/2024 15:12   CT Head Wo Contrast Result Date: 01/20/2024 CLINICAL DATA:  Neuro deficit, concern for stroke. Decreased energy, stumbling and falling, slurred speech for few days. Possible right-sided facial droop starting this morning. EXAM: CT HEAD WITHOUT CONTRAST TECHNIQUE: Contiguous axial images were obtained from the base of the skull through the vertex without intravenous contrast. RADIATION DOSE REDUCTION: This exam was performed according to the departmental dose-optimization program which includes automated exposure control, adjustment of the mA and/or kV according to patient size and/or use of iterative reconstruction technique.  COMPARISON:  CT head 08/15/2023. FINDINGS: Brain: No acute intracranial hemorrhage. No CT evidence of acute infarct. Nonspecific hypoattenuation in the periventricular and subcortical white matter favored to reflect chronic microvascular ischemic changes. No edema, mass effect, or midline shift. The basilar cisterns are patent. Ventricles: Prominence of the ventricles suggesting underlying parenchymal volume loss. Vascular: Atherosclerotic calcifications of the carotid siphons and intracranial vertebral arteries. No hyperdense vessel. Skull: No acute or aggressive finding. Orbits: Orbits are symmetric. Sinuses: Secretions in the right sphenoid sinus. Other: Mastoid air cells are clear. IMPRESSION: No  CT evidence of acute intracranial abnormality. Mild chronic microvascular ischemic changes. Mild parenchymal volume loss. Electronically Signed   By: Denny Flack M.D.   On: 01/20/2024 13:53    Scheduled Meds:  heparin  5,000 Units Subcutaneous Q8H   sodium chloride  flush  3 mL Intravenous Q12H   Continuous Infusions:  sodium chloride      cefTRIAXone (ROCEPHIN)  IV       LOS: 0 days   Modena Andes, MD [facility]  01/21/2024, 9:29 AM   *Please note that this is a verbal dictation therefore any spelling or grammatical errors are due to the "Dragon Medical One" system interpretation.  Please page via Amion and do not message via secure chat for urgent patient care matters. Secure chat can be used for non urgent patient care matters.  How to contact the [facility] Attending or Consulting provider 7A - 7P or covering provider during after hours 7P -7A, for this patient?  Check the care team in [facility] and look for a) attending/consulting [facility] provider listed and b) the [facility] team listed. Page or secure chat 7A-7P. Log into [email or web address] and use [facility]'s universal password to access. If you do not have the password, please contact the hospital operator. Locate the [facility] provider you are looking for under  [facility] and page to a number that you can be directly reached. If you still have difficulty reaching the provider, please page the [facility] (Director on Call) for the Hospitalists listed on amion for assistance.

## 2024-01-22 ENCOUNTER — Other Ambulatory Visit (HOSPITAL_COMMUNITY): Payer: Self-pay

## 2024-01-22 DIAGNOSIS — N390 Urinary tract infection, site not specified: Secondary | ICD-10-CM | POA: Diagnosis present

## 2024-01-22 DIAGNOSIS — E860 Dehydration: Secondary | ICD-10-CM | POA: Diagnosis not present

## 2024-01-22 LAB — CBC WITH DIFFERENTIAL/PLATELET
Abs Immature Granulocytes: 0 10*3/uL (ref 0.00–0.07)
Basophils Absolute: 0.6 10*3/uL — ABNORMAL HIGH (ref 0.0–0.1)
Basophils Relative: 2 %
Eosinophils Absolute: 0.9 10*3/uL — ABNORMAL HIGH (ref 0.0–0.5)
Eosinophils Relative: 3 %
HCT: 38.7 % (ref 36.0–46.0)
Hemoglobin: 12.6 g/dL (ref 12.0–15.0)
Lymphocytes Relative: 47 %
Lymphs Abs: 14.9 10*3/uL — ABNORMAL HIGH (ref 0.7–4.0)
MCH: 31.8 pg (ref 26.0–34.0)
MCHC: 32.6 g/dL (ref 30.0–36.0)
MCV: 97.7 fL (ref 80.0–100.0)
Monocytes Absolute: 1.6 10*3/uL — ABNORMAL HIGH (ref 0.1–1.0)
Monocytes Relative: 5 %
Neutro Abs: 13.6 10*3/uL — ABNORMAL HIGH (ref 1.7–7.7)
Neutrophils Relative %: 43 %
Platelets: 233 10*3/uL (ref 150–400)
RBC: 3.96 MIL/uL (ref 3.87–5.11)
RDW: 12.9 % (ref 11.5–15.5)
WBC: 31.6 10*3/uL — ABNORMAL HIGH (ref 4.0–10.5)
nRBC: 0 % (ref 0.0–0.2)
nRBC: 0 /100{WBCs}

## 2024-01-22 LAB — BASIC METABOLIC PANEL WITH GFR
Anion gap: 9 (ref 5–15)
BUN: 26 mg/dL — ABNORMAL HIGH (ref 8–23)
CO2: 22 mmol/L (ref 22–32)
Calcium: 8.5 mg/dL — ABNORMAL LOW (ref 8.9–10.3)
Chloride: 108 mmol/L (ref 98–111)
Creatinine, Ser: 1.55 mg/dL — ABNORMAL HIGH (ref 0.44–1.00)
GFR, Estimated: 31 mL/min — ABNORMAL LOW (ref >60)
Glucose, Bld: 103 mg/dL — ABNORMAL HIGH (ref 70–99)
Potassium: 4.4 mmol/L (ref 3.5–5.1)
Sodium: 139 mmol/L (ref 135–145)

## 2024-01-22 MED ORDER — SODIUM CHLORIDE 0.9 % IV SOLN: INTRAVENOUS | Status: DC

## 2024-01-22 MED ORDER — CEPHALEXIN 500 MG PO CAPS
500.0000 mg | ORAL_CAPSULE | Freq: Two times a day (BID) | ORAL | 0 refills | Status: AC
  Filled 2024-01-22: qty 10, 5d supply, fill #0

## 2024-01-22 NOTE — Discharge Summary (Signed)
 Physician Discharge Summary  [name] Williams:[id number] DOB: [birth year] DOA: 01/20/2024  PCP: Yevette Hem, FNP  Admit date: 01/20/2024 Discharge date: 01/22/2024    Admitted From: Home Disposition: Home  Recommendations for Outpatient Follow-up:  Follow up with PCP in 1-2 weeks Please obtain BMP/CBC in one week Please follow up with your PCP on the following pending results: Unresulted Labs (From admission, onward)     Start     Ordered   01/20/24 1315  Urine Culture  Once,   R        01/20/24 1315              Home Health: Yes Equipment/Devices: None  Discharge Condition: Stable CODE STATUS: Full code Diet recommendation: Cardiac  Subjective: Seen and examined.  When asked her how she was feeling, she said " can I go home, the way I feel now in the hospital is so much better than the way I felt when I came in, I do not know what happened but I will need to take care of myself better".  I informed her that she was dehydrated and that was likely the cause of hospitalization and encouraged her to make sure she drinks  Brief/Interim Summary: [name] Williams is a 89 y.o. female with past medical history  of CLL, CKD stage IIIa, anxiety, depression, GERD, essential hypertension, hypertrophic obstructive cardiomyopathy, hyperlipidemia, history of stroke, transferred from [facility] for generalized weakness, left facial droop and urinary tract infection.     Generalized weakness: Feeling much better as mentioned above.  Seen by PT OT, home health recommended which has been ordered for her..   Reports of right facial droop and aphasia by family: This was also noted in the ED at [facility] but not by the admitting hospitalist.  CT head in the ED was negative and regardless of that, due to concern of stroke, MRI was ordered which was unremarkable as well.  She does not appear to have any facial droop today.  She is fluent in speech.   Acute encephalopathy: Reportedly, patient was  unable to provide history and she was poor historian however today during my evaluation, she is fully alert and oriented but she just does not know who brought her to the hospital and why.  She tells me that she lives alone, her son lives in the basement and he is " alcoholic" and they both do not communicate much with each other.   UTI: Continue Rocephin.  Urine culture negative.  Discharging on Keflex  for 5 days.   Lactic acidosis/dehydration/hypotension/AKI on CKD stage IIIa: Baseline creatinine 1.2, present 1.8.  Lactic acidosis resolved, AKI improving and so is dehydration but still slightly dehydrated clinically so I will give her another 1 L of fluid bolus today before discharging.  Creatinine down to 1.5 which is very close to her baseline.    CLL: Last seen by oncology in January 2025.  At baseline, white cells are typically around 18-20 but currently around 38, likely due to active infection.  No fever.  Monitor closely and repeat labs in the morning.  PS: Per med rec, patient was not taking any of her medications and thus I have discontinued them on the med rec but I have continued only aspirin  which she should be taking.  Discharge plan was discussed with patient and/or family member/daughter [name] Williams and they verbalized understanding and agreed with it.  Discharge Diagnoses:  Principal Problem:   Dehydration Active Problems:   HYPERTENSION, BENIGN  UTI (urinary tract infection)    Discharge Instructions   Allergies as of 01/22/2024       Reactions   Latex Other (See Comments)   Per pt 11-10-14 she do not remember being allergic to this.../or        Medication List     STOP taking these medications    furosemide  20 MG tablet Commonly known as: LASIX    meclizine  12.5 MG tablet Commonly known as: ANTIVERT    megestrol  40 MG/ML suspension Commonly known as: MEGACE    mirtazapine  7.5 MG tablet Commonly known as: REMERON        TAKE these medications    aspirin  EC  81 MG tablet Take 1 tablet (81 mg total) by mouth daily. Swallow whole.   cephALEXin  500 MG capsule Commonly known as: KEFLEX  Take 1 capsule (500 mg total) by mouth 2 (two) times daily for 5 days.        Follow-up Information     [facility]  Follow up.   Why: [facility] will provide home health services.  They will call you in the next 24-48 hours to set up services. Contact information: [address] [place] [address] [phone]         Tommas Fragmin A, FNP Follow up in 1 week(s).   Specialty: Family Medicine Contact information: [address] [place] [address] [phone]                Allergies  Allergen Reactions   Latex Other (See Comments)    Per pt 11-10-14 she do not remember being allergic to this.../or    Consultations: None   Procedures/Studies: ECHOCARDIOGRAM COMPLETE Result Date: 01/21/2024    ECHOCARDIOGRAM REPORT   Patient Name:   [name] Williams Date of Exam: 01/21/2024 Medical Rec #:  [id number]     Height:       63.0 in Accession #:    [id number]    Weight:       126.1 lb Date of Birth:  29-Nov-1927      BSA:          1.589 m Patient Age:    89 years      BP:           109/56 mmHg Patient Gender: F             HR:           73 bpm. Exam Location:  Inpatient Procedure: 2D Echo, Cardiac Doppler, Color Doppler and Intracardiac            Opacification Agent (Both Spectral and Color Flow Doppler were            utilized during procedure). Indications:    Stroke I63.9  History:        Patient has no prior history of Echocardiogram examinations.                 Hypertrophic Cardiomyopathy, Stroke, Arrythmias:Bradycardia,                 Signs/Symptoms:Syncope; Risk Factors:Hypertension and                 Dyslipidemia.  Sonographer:    Terrilee Few RCS Referring Phys: [phone] EKTA V PATEL IMPRESSIONS  1. Asymmetrical left ventricular apical hypertrophy is seen, with midcavity obliteration, "ace of spades"  configuration and a significant volume of apical entrapment. There is high velocity diastolic emptying of the apical pocket, but there  is no frank aneurysm formation. There is no systolic anterior motion of the mitral valve and there is no outflow tract obstruction. No thrombus is seen (Definity contrast was used). Left ventricular ejection fraction, by estimation, is 70 to 75%. The left ventricle has hyperdynamic function. The left ventricle has no regional wall motion abnormalities. There is moderate asymmetric left ventricular hypertrophy of the basal-septal and apical segments. Left ventricular diastolic parameters are consistent with Grade I diastolic dysfunction (impaired relaxation).  2. Right ventricular systolic function is normal. The right ventricular size is normal. There is normal pulmonary artery systolic pressure.  3. Left atrial size was mildly dilated.  4. The mitral valve is normal in structure. Trivial mitral valve regurgitation. No evidence of mitral stenosis.  5. The aortic valve is tricuspid. Aortic valve regurgitation is trivial. No aortic stenosis is present.  6. The inferior vena cava is normal in size with greater than 50% respiratory variability, suggesting right atrial pressure of 3 mmHg. Conclusion(s)/Recommendation(s): Findings consistent with hypertrophic cardiomyopathy _ apical variant. The pocket of apical LV entrapment may be the source for thromboembolic events. FINDINGS  Left Ventricle: Asymmetrical left ventricular apical hypertrophy is seen, with midcavity obliteration, "ace of spades" configuration and a significant volume of apical entrapment. There is high velocity diastolic emptying of the apical pocket, but there  is no frank aneurysm formation. There is no systolic anterior motion of the mitral valve and there is no outflow tract obstruction. No thrombus is seen (Definity contrast was used). Left ventricular ejection fraction, by estimation, is 70 to 75%. The left  ventricle has hyperdynamic function. The left ventricle has no regional wall motion abnormalities. Definity contrast agent was given IV to delineate the left ventricular endocardial borders. The left ventricular internal cavity size was normal in size. There is moderate asymmetric left ventricular hypertrophy of the basal-septal and apical segments. Left ventricular diastolic parameters are consistent with Grade I diastolic dysfunction (impaired relaxation). Normal left ventricular filling pressure. Right Ventricle: The right ventricular size is normal. No increase in right ventricular wall thickness. Right ventricular systolic function is normal. There is normal pulmonary artery systolic pressure. The tricuspid regurgitant velocity is 1.35 m/s, and  with an assumed right atrial pressure of 3 mmHg, the estimated right ventricular systolic pressure is 10.3 mmHg. Left Atrium: Left atrial size was mildly dilated. Right Atrium: Right atrial size was normal in size. Pericardium: There is no evidence of pericardial effusion. Mitral Valve: The mitral valve is normal in structure. Mild mitral annular calcification. Trivial mitral valve regurgitation. No evidence of mitral valve stenosis. Tricuspid Valve: The tricuspid valve is normal in structure. Tricuspid valve regurgitation is trivial. Aortic Valve: The aortic valve is tricuspid. Aortic valve regurgitation is trivial. No aortic stenosis is present. Aortic valve peak gradient measures 8.0 mmHg. Pulmonic Valve: The pulmonic valve was grossly normal. Pulmonic valve regurgitation is not visualized. No evidence of pulmonic stenosis. Aorta: The aortic root and ascending aorta are structurally normal, with no evidence of dilitation. Venous: The inferior vena cava is normal in size with greater than 50% respiratory variability, suggesting right atrial pressure of 3 mmHg. IAS/Shunts: No atrial level shunt detected by color flow Doppler.  LEFT VENTRICLE PLAX 2D LVIDd:         4.50  cm   Diastology LVIDs:         2.20 cm   LV e' medial:    5.98 cm/s LV PW:         0.80 cm  LV E/e' medial:  6.4 LV IVS:        1.50 cm   LV e' lateral:   6.42 cm/s LVOT diam:     1.90 cm   LV E/e' lateral: 6.0 LV SV:         46 LV SV Index:   29 LVOT Area:     2.84 cm  RIGHT VENTRICLE             IVC RV S prime:     18.60 cm/s  IVC diam: 1.80 cm TAPSE (M-mode): 1.1 cm LEFT ATRIUM             Index        RIGHT ATRIUM          Index LA diam:        4.20 cm 2.64 cm/m   RA Area:     7.96 cm LA Vol (A2C):   36.8 ml 23.15 ml/m  RA Volume:   13.11 ml 8.24 ml/m LA Vol (A4C):   31.4 ml 19.76 ml/m LA Biplane Vol: 33.9 ml 21.33 ml/m  AORTIC VALVE AV Area (Vmax): 2.03 cm AV Vmax:        141.00 cm/s AV Peak Grad:   8.0 mmHg LVOT Vmax:      101.00 cm/s LVOT Vmean:     63.000 cm/s LVOT VTI:       0.163 m  AORTA Ao Root diam: 3.00 cm Ao Asc diam:  3.40 cm MITRAL VALVE               TRICUSPID VALVE MV Area (PHT): 2.69 cm    TR Peak grad:   7.3 mmHg MV Decel Time: 282 msec    TR Vmax:        135.00 cm/s MV E velocity: 38.20 cm/s MV A velocity: 67.30 cm/s  SHUNTS MV E/A ratio:  0.57        Systemic VTI:  0.16 m                            Systemic Diam: 1.90 cm Karyl Paget Croitoru MD Electronically signed by Luana Rumple MD Signature Date/Time: 01/21/2024/3:07:24 PM    Final    VAS US  CAROTID Result Date: 01/21/2024 Carotid Arterial Duplex Study Patient Name:  CHARLI HALLE  Date of Exam:   01/21/2024 Medical Rec #: [id number]      Accession #:    [id number] Date of Birth: 08-10-1928       Patient Gender: F Patient Age:   52 years Exam Location:  [facility] Procedure:      VAS US  CAROTID Referring Phys: EKTA PATEL --------------------------------------------------------------------------------  Indications:       CVA and Aphasia. Risk Factors:      Hypertension. Other Factors:     CRD. Comparison Study:  02/12/14 Performing Technologist: Franky Ivanoff Sturdivant-Jones RDMS, RVT  Examination Guidelines: A complete evaluation includes  B-mode imaging, spectral Doppler, color Doppler, and power Doppler as needed of all accessible portions of each vessel. Bilateral testing is considered an integral part of a complete examination. Limited examinations for reoccurring indications may be performed as noted.  Right Carotid Findings: +----------+--------+--------+--------+------------------+--------+           PSV cm/sEDV cm/sStenosisPlaque DescriptionComments +----------+--------+--------+--------+------------------+--------+ CCA Prox  57      14                                         +----------+--------+--------+--------+------------------+--------+  CCA Distal48      13                                         +----------+--------+--------+--------+------------------+--------+ ICA Prox  66      16      1-39%   calcific                   +----------+--------+--------+--------+------------------+--------+ ICA Mid   108     27      1-39%   calcific                   +----------+--------+--------+--------+------------------+--------+ ICA Distal74      15                                         +----------+--------+--------+--------+------------------+--------+ ECA       88      9                                          +----------+--------+--------+--------+------------------+--------+ +----------+--------+-------+----------------+-------------------+           PSV cm/sEDV cmsDescribe        Arm Pressure (mmHG) +----------+--------+-------+----------------+-------------------+ Subclavian131            Multiphasic, WNL                    +----------+--------+-------+----------------+-------------------+ +---------+--------+--+--------+--+---------+ VertebralPSV cm/s49EDV cm/s10Antegrade +---------+--------+--+--------+--+---------+  Left Carotid Findings: +----------+--------+--------+--------+------------------+--------+           PSV cm/sEDV cm/sStenosisPlaque DescriptionComments  +----------+--------+--------+--------+------------------+--------+ CCA Prox  102     14                                         +----------+--------+--------+--------+------------------+--------+ CCA Distal51      10                                         +----------+--------+--------+--------+------------------+--------+ ICA Prox  69      15      1-39%   calcific                   +----------+--------+--------+--------+------------------+--------+ ICA Distal66      21                                         +----------+--------+--------+--------+------------------+--------+ ECA       81                                                 +----------+--------+--------+--------+------------------+--------+ +----------+--------+--------+----------------+-------------------+           PSV cm/sEDV cm/sDescribe        Arm Pressure (mmHG) +----------+--------+--------+----------------+-------------------+ [id number]             Multiphasic, WNL                    +----------+--------+--------+----------------+-------------------+ +---------+--------+--+--------+--+---------+  VertebralPSV cm/s46EDV cm/s10Antegrade +---------+--------+--+--------+--+---------+   Summary: Right Carotid: Velocities in the right ICA are consistent with a 1-39% stenosis. Left Carotid: Velocities in the left ICA are consistent with a 1-39% stenosis. Vertebrals:  Bilateral vertebral arteries demonstrate antegrade flow. Subclavians: Normal flow hemodynamics were seen in bilateral subclavian              arteries. *See table(s) above for measurements and observations.     Preliminary    MR BRAIN WO CONTRAST Result Date: 01/21/2024 CLINICAL DATA:  Mental status change, unknown cause EXAM: MRI HEAD WITHOUT CONTRAST TECHNIQUE: Multiplanar, multiecho pulse sequences of the brain and surrounding structures were obtained without intravenous contrast. COMPARISON:  CT head from earlier today.  MRI  head February 13, 2019. FINDINGS: Brain: No acute infarction, hemorrhage, hydrocephalus, extra-axial collection or mass lesion. Cerebral atrophy. Vascular: Major arterial flow voids are maintained. Skull and upper cervical spine: Normal marrow signal. Sinuses/Orbits: Clear sinuses.  No acute orbital findings. Other: No mastoid effusions. IMPRESSION: No evidence of acute intracranial abnormality. Electronically Signed   By: Stevenson Elbe M.D.   On: 01/21/2024 03:08   DG Chest Port 1 View Result Date: 01/20/2024 CLINICAL DATA:  Shortness of breath EXAM: PORTABLE CHEST 1 VIEW COMPARISON:  Chest x-ray 06/16/2021 FINDINGS: There is some stable strandy opacities in the left lung base favored as scarring. The lungs are otherwise clear. There is no pleural effusion or pneumothorax. The cardiomediastinal silhouette is within normal limits. No acute fractures are seen. IMPRESSION: No active disease. Electronically Signed   By: Tyron Gallon M.D.   On: 01/20/2024 15:12   CT Head Wo Contrast Result Date: 01/20/2024 CLINICAL DATA:  Neuro deficit, concern for stroke. Decreased energy, stumbling and falling, slurred speech for few days. Possible right-sided facial droop starting this morning. EXAM: CT HEAD WITHOUT CONTRAST TECHNIQUE: Contiguous axial images were obtained from the base of the skull through the vertex without intravenous contrast. RADIATION DOSE REDUCTION: This exam was performed according to the departmental dose-optimization program which includes automated exposure control, adjustment of the mA and/or kV according to patient size and/or use of iterative reconstruction technique. COMPARISON:  CT head 08/15/2023. FINDINGS: Brain: No acute intracranial hemorrhage. No CT evidence of acute infarct. Nonspecific hypoattenuation in the periventricular and subcortical white matter favored to reflect chronic microvascular ischemic changes. No edema, mass effect, or midline shift. The basilar cisterns are patent.  Ventricles: Prominence of the ventricles suggesting underlying parenchymal volume loss. Vascular: Atherosclerotic calcifications of the carotid siphons and intracranial vertebral arteries. No hyperdense vessel. Skull: No acute or aggressive finding. Orbits: Orbits are symmetric. Sinuses: Secretions in the right sphenoid sinus. Other: Mastoid air cells are clear. IMPRESSION: No CT evidence of acute intracranial abnormality. Mild chronic microvascular ischemic changes. Mild parenchymal volume loss. Electronically Signed   By: Denny Flack M.D.   On: 01/20/2024 13:53     Discharge Exam: Vitals:   01/22/24 0417 01/22/24 0804  BP: 108/66 103/60  Pulse: 66 62  Resp: 18 17  Temp: 98.8 F (37.1 C) 98.3 F (36.8 C)  SpO2: 100% 97%   Vitals:   01/21/24 1648 01/21/24 1954 01/22/24 0417 01/22/24 0804  BP: 120/73 (!) 121/59 108/66 103/60  Pulse: 75 83 66 62  Resp: 17 20 18 17   Temp: 98.1 F (36.7 C) 98.2 F (36.8 C) 98.8 F (37.1 C) 98.3 F (36.8 C)  TempSrc:      SpO2: 100% 96% 100% 97%  Weight:      Height:  General: Pt is alert, awake, not in acute distress Cardiovascular: RRR, S1/S2 +, no rubs, no gallops Respiratory: CTA bilaterally, no wheezing, no rhonchi Abdominal: Soft, NT, ND, bowel sounds + Extremities: no edema, no cyanosis    The results of significant diagnostics from this hospitalization (including imaging, microbiology, ancillary and laboratory) are listed below for reference.     Microbiology: Recent Results (from the past 240 hours)  Culture, blood (Routine X 2) w Reflex to ID Panel     Status: None (Preliminary result)   Collection Time: 01/20/24  1:15 PM   Specimen: BLOOD  Result Value Ref Range Status   Specimen Description   Final    BLOOD LEFT ANTECUBITAL Performed at [facility], [address], [place], [place] [address]    Special Requests   Final    BOTTLES DRAWN AEROBIC AND ANAEROBIC Blood Culture adequate  volume Performed at [facility], [address] [place], [place] [address]    Culture   Final    NO GROWTH < 24 HOURS Performed at [facility], [address] [place], [place] [address]    Report Status PENDING  Incomplete  Culture, blood (Routine X 2) w Reflex to ID Panel     Status: None (Preliminary result)   Collection Time: 01/20/24  1:20 PM   Specimen: BLOOD  Result Value Ref Range Status   Specimen Description   Final    BLOOD RIGHT ANTECUBITAL Performed at [facility], [address], [place], [place] [address]    Special Requests   Final    BOTTLES DRAWN AEROBIC AND ANAEROBIC Blood Culture adequate volume Performed at [facility], [address], [place], [place] [address]    Culture   Final    NO GROWTH < 24 HOURS Performed at [facility], [address], [place], [place] [address]    Report Status PENDING  Incomplete     Labs: BNP (last 3 results) Recent Labs    01/06/24 1434  BNP 651.9*   Basic Metabolic Panel: Recent Labs  Lab 01/20/24 1212 01/21/24 0533 01/22/24 0600  NA 140 139 139  K 4.1 4.6 4.4  CL 101 109 108  CO2 26 26 22   GLUCOSE 108* 92 103*  BUN 40* 27* 26*  CREATININE 1.80* 1.45* 1.55*  CALCIUM 9.9 8.2* 8.5*  MG  --  2.3  --    Liver Function Tests: Recent Labs  Lab 01/20/24 1212 01/21/24 0533  AST 27 20  ALT 13 10  ALKPHOS 69 39  BILITOT 0.6 1.1  PROT 7.5 5.2*  ALBUMIN 4.2 2.8*   No results for input(s): "LIPASE", "AMYLASE" in the last 168 hours. No results for input(s): "AMMONIA" in the last 168 hours. CBC: Recent Labs  Lab 01/20/24 1212 01/21/24 0533 01/22/24 0600  WBC 33.5* 34.5* 31.6*  NEUTROABS 9.2*  --  13.6*  HGB 15.9* 13.7 12.6  HCT 49.0* 42.4 38.7  MCV 97.8 97.9 97.7  PLT 284 257 233   Cardiac Enzymes: No results for input(s): "CKTOTAL", "CKMB", "CKMBINDEX", "TROPONINI" in the last 168 hours. BNP: Invalid input(s):  "POCBNP" CBG: No results for input(s): "GLUCAP" in the last 168 hours. D-Dimer No results for input(s): "DDIMER" in the last 72 hours. Hgb A1c No results for input(s): "HGBA1C" in the last 72 hours. Lipid Profile No results for input(s): "CHOL", "HDL", "LDLCALC", "TRIG", "CHOLHDL", "LDLDIRECT" in the last 72 hours. Thyroid  function studies No results for input(s): "TSH", "T4TOTAL", "T3FREE", "  THYROIDAB" in the last 72 hours.  Invalid input(s): "FREET3" Anemia work up No results for input(s): "VITAMINB12", "FOLATE", "FERRITIN", "TIBC", "IRON", "RETICCTPCT" in the last 72 hours. Urinalysis    Component Value Date/Time   COLORURINE YELLOW 01/20/2024 1315   APPEARANCEUR CLEAR 01/20/2024 1315   APPEARANCEUR Cloudy (A) 12/28/2020 1458   LABSPEC 1.025 01/20/2024 1315   PHURINE 5.5 01/20/2024 1315   GLUCOSEU NEGATIVE 01/20/2024 1315   HGBUR LARGE (A) 01/20/2024 1315   BILIRUBINUR NEGATIVE 01/20/2024 1315   BILIRUBINUR Negative 12/28/2020 1458   KETONESUR NEGATIVE 01/20/2024 1315   PROTEINUR 30 (A) 01/20/2024 1315   UROBILINOGEN 0.2 03/31/2010 0843   NITRITE NEGATIVE 01/20/2024 1315   LEUKOCYTESUR SMALL (A) 01/20/2024 1315   Sepsis Labs Recent Labs  Lab 01/20/24 1212 01/21/24 0533 01/22/24 0600  WBC 33.5* 34.5* 31.6*   Microbiology Recent Results (from the past 240 hours)  Culture, blood (Routine X 2) w Reflex to ID Panel     Status: None (Preliminary result)   Collection Time: 01/20/24  1:15 PM   Specimen: BLOOD  Result Value Ref Range Status   Specimen Description   Final    BLOOD LEFT ANTECUBITAL Performed at [facility], [address], [place], [place] [address]    Special Requests   Final    BOTTLES DRAWN AEROBIC AND ANAEROBIC Blood Culture adequate volume Performed at [facility], [address] [place], [place] [address]    Culture   Final    NO GROWTH < 24 HOURS Performed at [facility], [address]  [place], [place] [address]    Report Status PENDING  Incomplete  Culture, blood (Routine X 2) w Reflex to ID Panel     Status: None (Preliminary result)   Collection Time: 01/20/24  1:20 PM   Specimen: BLOOD  Result Value Ref Range Status   Specimen Description   Final    BLOOD RIGHT ANTECUBITAL Performed at [facility], [address], [place], [place] [address]    Special Requests   Final    BOTTLES DRAWN AEROBIC AND ANAEROBIC Blood Culture adequate volume Performed at [facility], [address] [place], [place] [address]    Culture   Final    NO GROWTH < 24 HOURS Performed at [facility], [address], [place], [place] [address]    Report Status PENDING  Incomplete    FURTHER DISCHARGE INSTRUCTIONS:   Get Medicines reviewed and adjusted: Please take all your medications with you for your next visit with your Primary MD   Laboratory/radiological data: Please request your Primary MD to go over all hospital tests and procedure/radiological results at the follow up, please ask your Primary MD to get all Hospital records sent to his/her office.   In some cases, they will be blood work, cultures and biopsy results pending at the time of your discharge. Please request that your primary care M.D. goes through all the records of your hospital data and follows up on these results.   Also Note the following: If you experience worsening of your admission symptoms, develop shortness of breath, life threatening emergency, suicidal or homicidal thoughts you must seek medical attention immediately by calling 911 or calling your MD immediately  if symptoms less severe.   You must read complete instructions/literature along with all the possible adverse reactions/side effects for all the Medicines you take and that have been prescribed to you. Take any new Medicines after you have completely  understood and accpet all the possible adverse  reactions/side effects.    patient was instructed, not to drive, operate heavy machinery, perform activities at heights, swimming or participation in water activities or provide baby-sitting services while on Pain, Sleep and Anxiety Medications; until their outpatient Physician has advised to do so again. Also recommended to not to take more than prescribed Pain, Sleep and Anxiety Medications.  It is not advisable to combine anxiety, sleep and pain medications without talking with your primary care provider.     Wear Seat belts while driving.   Please note: You were cared for by a hospitalist during your hospital stay. Once you are discharged, your primary care physician will handle any further medical issues. Please note that NO REFILLS for any discharge medications will be authorized once you are discharged, as it is imperative that you return to your primary care physician (or establish a relationship with a primary care physician if you do not have one) for your post hospital discharge needs so that they can reassess your need for medications and monitor your lab values  Time coordinating discharge: Over 30 minutes  SIGNED:   Modena Andes, MD  [facility] 01/22/2024, 9:16 AM *Please note that this is a verbal dictation therefore any spelling or grammatical errors are due to the "Dragon Medical One" system interpretation. If 7PM-7AM, please contact night-coverage [email or web address]

## 2024-01-22 NOTE — Plan of Care (Signed)
 Assumed care at 1900. Pt has been resting comfortably in bed overnight. Pt has no complaints of pain or discomfort. See MAR. No significant events overnight. Pt states "Im leaving today no matter what."   Problem: Education: Goal: Knowledge of General Education information will improve Description: Including pain rating scale, medication(s)/side effects and non-pharmacologic comfort measures Outcome: Progressing   Problem: Health Behavior/Discharge Planning: Goal: Ability to manage health-related needs will improve Outcome: Progressing   Problem: Clinical Measurements: Goal: Ability to maintain clinical measurements within normal limits will improve Outcome: Progressing Goal: Will remain free from infection Outcome: Progressing Goal: Diagnostic test results will improve Outcome: Progressing Goal: Respiratory complications will improve Outcome: Progressing Goal: Cardiovascular complication will be avoided Outcome: Progressing   Problem: Activity: Goal: Risk for activity intolerance will decrease Outcome: Progressing   Problem: Nutrition: Goal: Adequate nutrition will be maintained Outcome: Progressing   Problem: Coping: Goal: Level of anxiety will decrease Outcome: Progressing   Problem: Elimination: Goal: Will not experience complications related to bowel motility Outcome: Progressing Goal: Will not experience complications related to urinary retention Outcome: Progressing   Problem: Pain Managment: Goal: General experience of comfort will improve and/or be controlled Outcome: Progressing   Problem: Safety: Goal: Ability to remain free from injury will improve Outcome: Progressing   Problem: Skin Integrity: Goal: Risk for impaired skin integrity will decrease Outcome: Progressing

## 2024-01-23 ENCOUNTER — Telehealth: Payer: Self-pay

## 2024-01-23 NOTE — Transitions of Care (Post Inpatient/ED Visit) (Signed)
   01/23/2024  Name: [name] Williams MRN: [id number] DOB: [birth year]  Today's TOC FU Call Status: Today's TOC FU Call Status:: Successful TOC FU Call Completed TOC FU Call Complete Date: 01/23/24 Patient's Name and Date of Birth confirmed.  Transition Care Management Follow-up Telephone Call Date of Discharge: 01/22/24 Discharge Facility: [facility]) Type of Discharge: Inpatient Admission Primary Inpatient Discharge Diagnosis:: dehydration How have you been since you were released from the hospital?: Better Any questions or concerns?: No  Items Reviewed: Did you receive and understand the discharge instructions provided?: Yes Medications obtained,verified, and reconciled?: Yes (Medications Reviewed) Any new allergies since your discharge?: No Dietary orders reviewed?: Yes Do you have support at home?: No  Medications Reviewed Today: Medications Reviewed Today     Reviewed by Darrall Ellison, LPN (Licensed Practical Nurse) on 01/23/24 at [phone]  Med List Status: <None>   Medication Order Taking? Sig Documenting Provider Last Dose Status Informant  aspirin  EC 81 MG tablet [id number]  Take 1 tablet (81 mg total) by mouth daily. Swallow whole.  Patient not taking: Reported on 01/21/2024   Yevette Hem, FNP  Active Family Member, Self  cephALEXin  (KEFLEX ) 500 MG capsule [id number]  Take 1 capsule (500 mg total) by mouth 2 (two) times daily for 5 days. Modena Andes, MD  Active   megestrol  (MEGACE ) 40 MG/ML suspension [id number] Yes Take 400 mg by mouth 2 (two) times daily. [provider]  Active   mirtazapine  (REMERON ) 7.5 MG tablet [id number] Yes Take 7.5 mg by mouth at bedtime. [provider]  Active             Home Care and Equipment/Supplies: Were Home Health Services Ordered?: Yes Name of Home Health Agency:: unknown Has Agency set up a time to come to your home?: No Any new equipment or medical supplies ordered?: NA  Functional Questionnaire: Do you  need assistance with bathing/showering or dressing?: Yes Do you need assistance with meal preparation?: Yes Do you need assistance with eating?: No Do you have difficulty maintaining continence: No Do you need assistance with getting out of bed/getting out of a chair/moving?: No Do you have difficulty managing or taking your medications?: Yes  Follow up appointments reviewed: PCP Follow-up appointment confirmed?: Yes Date of PCP follow-up appointment?: 01/28/24 Follow-up Provider: [facility] Follow-up appointment confirmed?: NA Do you need transportation to your follow-up appointment?: No Do you understand care options if your condition(s) worsen?: Yes-patient verbalized understanding    SIGNATURE Darrall Ellison, LPN [facility] Nurse Health Advisor Direct Dial [phone]

## 2024-01-24 DIAGNOSIS — I421 Obstructive hypertrophic cardiomyopathy: Secondary | ICD-10-CM | POA: Diagnosis not present

## 2024-01-24 DIAGNOSIS — R4701 Aphasia: Secondary | ICD-10-CM | POA: Diagnosis not present

## 2024-01-24 DIAGNOSIS — Z8673 Personal history of transient ischemic attack (TIA), and cerebral infarction without residual deficits: Secondary | ICD-10-CM | POA: Diagnosis not present

## 2024-01-24 DIAGNOSIS — N3090 Cystitis, unspecified without hematuria: Secondary | ICD-10-CM | POA: Diagnosis not present

## 2024-01-24 DIAGNOSIS — Z7982 Long term (current) use of aspirin: Secondary | ICD-10-CM | POA: Diagnosis not present

## 2024-01-24 DIAGNOSIS — I1310 Hypertensive heart and chronic kidney disease without heart failure, with stage 1 through stage 4 chronic kidney disease, or unspecified chronic kidney disease: Secondary | ICD-10-CM | POA: Diagnosis not present

## 2024-01-24 DIAGNOSIS — N1831 Chronic kidney disease, stage 3a: Secondary | ICD-10-CM | POA: Diagnosis not present

## 2024-01-24 DIAGNOSIS — K219 Gastro-esophageal reflux disease without esophagitis: Secondary | ICD-10-CM | POA: Diagnosis not present

## 2024-01-24 DIAGNOSIS — G9340 Encephalopathy, unspecified: Secondary | ICD-10-CM | POA: Diagnosis not present

## 2024-01-24 DIAGNOSIS — E785 Hyperlipidemia, unspecified: Secondary | ICD-10-CM | POA: Diagnosis not present

## 2024-01-24 DIAGNOSIS — N179 Acute kidney failure, unspecified: Secondary | ICD-10-CM | POA: Diagnosis not present

## 2024-01-24 DIAGNOSIS — E860 Dehydration: Secondary | ICD-10-CM | POA: Diagnosis not present

## 2024-01-24 DIAGNOSIS — Z556 Problems related to health literacy: Secondary | ICD-10-CM | POA: Diagnosis not present

## 2024-01-24 DIAGNOSIS — I252 Old myocardial infarction: Secondary | ICD-10-CM | POA: Diagnosis not present

## 2024-01-25 LAB — CULTURE, BLOOD (ROUTINE X 2)
Culture: NO GROWTH
Culture: NO GROWTH
Special Requests: ADEQUATE
Special Requests: ADEQUATE

## 2024-01-28 ENCOUNTER — Ambulatory Visit: Admitting: Family

## 2024-01-28 ENCOUNTER — Encounter: Payer: Self-pay | Admitting: Family

## 2024-01-28 VITALS — BP 129/77 | HR 83 | Temp 97.9°F | Ht 63.0 in | Wt 128.4 lb

## 2024-01-28 DIAGNOSIS — E860 Dehydration: Secondary | ICD-10-CM | POA: Diagnosis not present

## 2024-01-28 DIAGNOSIS — C9110 Chronic lymphocytic leukemia of B-cell type not having achieved remission: Secondary | ICD-10-CM | POA: Diagnosis not present

## 2024-01-28 DIAGNOSIS — N3000 Acute cystitis without hematuria: Secondary | ICD-10-CM

## 2024-01-28 DIAGNOSIS — Z09 Encounter for follow-up examination after completed treatment for conditions other than malignant neoplasm: Secondary | ICD-10-CM | POA: Diagnosis not present

## 2024-01-28 DIAGNOSIS — R600 Localized edema: Secondary | ICD-10-CM

## 2024-01-28 DIAGNOSIS — E441 Mild protein-calorie malnutrition: Secondary | ICD-10-CM

## 2024-01-28 MED ORDER — MEGESTROL ACETATE 40 MG/ML PO SUSP: 400.0000 mg | Freq: Two times a day (BID) | ORAL | 2 refills | Status: DC

## 2024-01-28 NOTE — Patient Instructions (Signed)
 Peripheral Edema  Peripheral edema is swelling that is caused by a buildup of fluid. Peripheral edema most often affects the lower legs, ankles, and feet. It can also develop in the arms, hands, and face. The area of the body that has peripheral edema will look swollen. It may also feel heavy or warm. Your clothes may start to feel tight. Pressing on the area may make a temporary dent in your skin (pitting edema). You may not be able to move your swollen arm or leg as much as usual. There are many causes of peripheral edema. It can happen because of a complication of other conditions such as heart failure, kidney disease, or a problem with your circulation. It also can be a side effect of certain medicines or happen because of an infection. It often happens to women during pregnancy. Sometimes, the cause is not known. Follow these instructions at home: Managing pain, stiffness, and swelling  Raise (elevate) your legs while you are sitting or lying down. Move around often to prevent stiffness and to reduce swelling. Do not sit or stand for long periods of time. Do not wear tight clothing. Do not wear garters on your upper legs. Exercise your legs to get your circulation going. This helps to move the fluid back into your blood vessels, and it may help the swelling go down. Wear compression stockings as told by your health care provider. These stockings help to prevent blood clots and reduce swelling in your legs. It is important that these are the correct size. These stockings should be prescribed by your doctor to prevent possible injuries. If elastic bandages or wraps are recommended, use them as told by your health care provider. Medicines Take over-the-counter and prescription medicines only as told by your health care provider. Your health care provider may prescribe medicine to help your body get rid of excess water (diuretic). Take this medicine if you are told to take it. General  instructions Eat a low-salt (low-sodium) diet as told by your health care provider. Sometimes, eating less salt may reduce swelling. Pay attention to any changes in your symptoms. Moisturize your skin daily to help prevent skin from cracking and draining. Keep all follow-up visits. This is important. Contact a health care provider if: You have a fever. You have swelling in only one leg. You have increased swelling, redness, or pain in one or both of your legs. You have drainage or sores at the area where you have edema. Get help right away if: You have edema that starts suddenly or is getting worse, especially if you are pregnant or have a medical condition. You develop shortness of breath, especially when you are lying down. You have pain in your chest or abdomen. You feel weak. You feel like you will faint. These symptoms may be an emergency. Get help right away. Call 911. Do not wait to see if the symptoms will go away. Do not drive yourself to the hospital. Summary Peripheral edema is swelling that is caused by a buildup of fluid. Peripheral edema most often affects the lower legs, ankles, and feet. Move around often to prevent stiffness and to reduce swelling. Do not sit or stand for long periods of time. Pay attention to any changes in your symptoms. Contact a health care provider if you have edema that starts suddenly or is getting worse, especially if you are pregnant or have a medical condition. Get help right away if you develop shortness of breath, especially when lying down.  This information is not intended to replace advice given to you by your health care provider. Make sure you discuss any questions you have with your health care provider. Document Revised: 04/10/2021 Document Reviewed: 04/10/2021 Elsevier Patient Education  2024 [employer].

## 2024-01-28 NOTE — Progress Notes (Signed)
 Subjective:    Patient ID: [name] Williams, female    DOB: 1927/12/30, 89 y.o.   MRN: [id number]  Chief Complaint  Patient presents with   Transitions Of Care   Today's visit was for Transitional Care Management.  The patient was discharged from [facility] on 01/22/24  with a primary diagnosis of dehydration.   Contact with the patient and/or caregiver, by a clinical staff member, was made on 01/23/24 and was documented as a telephone encounter within the EMR.  Through chart review and discussion with the patient I have determined that management of their condition is of moderate complexity.   She was told to stop her medications. Unsure if they should restart these are not.   Home Health has come out and did an evaluation and will set up a schedule for PT and OT.   She was diagnosed with UTI and given rocephin  and discharged home on Keflex  for 5 days.   She has CLL and is followed by Oncologists. She was given Megace  that greatly helped with her appetite.      01/28/2024   12:02 PM 01/21/2024    4:37 AM 01/20/2024   12:00 PM  Last 3 Weights  Weight (lbs) 128 lb 6.4 oz 126 lb 1.7 oz 125 lb  Weight (kg) 58.242 kg 57.2 kg 56.7 kg     Depression        This is a chronic problem.  The current episode started more than 1 year ago.   Associated symptoms include helplessness, hopelessness, decreased interest and sad.      Review of Systems  Psychiatric/Behavioral:  Positive for depression.   All other systems reviewed and are negative.   Social History   Socioeconomic History   Marital status: Widowed    Spouse name: Not on file   Number of children: 3   Years of education: Not on file   Highest education level: 9th grade  Occupational History   Not on file  Tobacco Use   Smoking status: Some Days    Types: Cigarettes   Smokeless tobacco: Never   Tobacco comments:    quit 2012  Vaping Use   Vaping status: Never Used  Substance and Sexual Activity   Alcohol use: No    Drug use: No   Sexual activity: Not Currently  Other Topics Concern   Not on file  Social History Narrative   Patient is retired.    Patient is widowed.    Patient has 3 children.    Patient is right handed.    Pt lives with son.     Social Drivers of Corporate investment banker Strain: Low Risk  (07/21/2018)   Overall Financial Resource Strain (CARDIA)    Difficulty of Paying Living Expenses: Not hard at all  Food Insecurity: Patient Declined (01/21/2024)   Hunger Vital Sign    Worried About Running Out of Food in the Last Year: Patient declined    Ran Out of Food in the Last Year: Patient declined  Transportation Needs: Patient Declined (01/21/2024)   PRAPARE - Administrator, Civil Service (Medical): Patient declined    Lack of Transportation (Non-Medical): Patient declined  Physical Activity: Inactive (11/16/2021)   Exercise Vital Sign    Days of Exercise per Week: 0 days    Minutes of Exercise per Session: 0 min  Stress: Stress Concern Present (11/16/2021)   [employer] of Occupational Health - Occupational Stress Questionnaire  Feeling of Stress : To some extent  Social Connections: Unknown (01/21/2024)   Social Connection and Isolation Panel [NHANES]    Frequency of Communication with Friends and Family: Patient declined    Frequency of Social Gatherings with Friends and Family: Patient declined    Attends Religious Services: Not on Marketing executive or Organizations: Patient declined    Attends Banker Meetings: Patient declined    Marital Status: Patient declined   Family History  Problem Relation Age of Onset   Breast cancer Mother    Stomach cancer Sister    Uterine cancer Sister    Depression Sister    Cancer Brother    Alcohol abuse Son         Objective:   Physical Exam Vitals reviewed.  Constitutional:      General: She is not in acute distress.    Appearance: She is well-developed.  HENT:     Head:  Normocephalic and atraumatic.     Right Ear: Tympanic membrane normal.     Left Ear: Tympanic membrane normal.  Eyes:     Pupils: Pupils are equal, round, and reactive to light.  Neck:     Thyroid : No thyromegaly.  Cardiovascular:     Rate and Rhythm: Normal rate and regular rhythm.     Heart sounds: Normal heart sounds. No murmur heard. Pulmonary:     Effort: Pulmonary effort is normal. No respiratory distress.     Breath sounds: Normal breath sounds. No wheezing.  Abdominal:     General: Bowel sounds are normal. There is no distension.     Palpations: Abdomen is soft.     Tenderness: There is no abdominal tenderness.  Musculoskeletal:        General: No tenderness. Normal range of motion.     Cervical back: Normal range of motion and neck supple.     Right lower leg: Edema (2+) present.     Left lower leg: Edema (2+) present.  Skin:    General: Skin is warm and dry.  Neurological:     Mental Status: She is alert and oriented to person, place, and time.     Cranial Nerves: No cranial nerve deficit.     Deep Tendon Reflexes: Reflexes are normal and symmetric.  Psychiatric:        Behavior: Behavior normal.        Thought Content: Thought content normal.        Judgment: Judgment normal.       BP 129/77   Pulse 83   Temp 97.9 F (36.6 C)   Ht 5\' 3"  (1.6 m)   Wt 128 lb 6.4 oz (58.2 kg)   SpO2 96%   BMI 22.75 kg/m      Assessment & Plan:  [name] Williams comes in today with chief complaint of Transitions Of Care   Diagnosis and orders addressed:  1. CLL (chronic lymphocytic leukemia) (HCC) (Primary) - megestrol  (MEGACE ) 40 MG/ML suspension; Take 10 mLs (400 mg total) by mouth 2 (two) times daily.  Dispense: 240 mL; Refill: 2 - CMP14+EGFR - CBC with Differential/Platelet  2. Dehydration - CMP14+EGFR - CBC with Differential/Platelet  3. Mild protein-calorie malnutrition (HCC) - CMP14+EGFR - CBC with Differential/Platelet  4. Hospital discharge follow-up -  CMP14+EGFR - CBC with Differential/Platelet  5. Peripheral edema - Compression stockings - CMP14+EGFR - CBC with Differential/Platelet  6. Acute cystitis without hematuria Continue Keflex    Labs pending [facility]  notes reviewed  Continue keflex   Continue current medications  Can restart megace  Start wearing compression hose Low salt diet  Keep follow up with specialists  Health Maintenance reviewed Diet and exercise encouraged  Return in about 2 months (around 03/29/2024), or if symptoms worsen or fail to improve.    Tommas Fragmin, FNP

## 2024-01-29 DIAGNOSIS — N1831 Chronic kidney disease, stage 3a: Secondary | ICD-10-CM | POA: Diagnosis not present

## 2024-01-29 DIAGNOSIS — I252 Old myocardial infarction: Secondary | ICD-10-CM | POA: Diagnosis not present

## 2024-01-29 DIAGNOSIS — I421 Obstructive hypertrophic cardiomyopathy: Secondary | ICD-10-CM | POA: Diagnosis not present

## 2024-01-29 DIAGNOSIS — E860 Dehydration: Secondary | ICD-10-CM | POA: Diagnosis not present

## 2024-01-29 DIAGNOSIS — I1310 Hypertensive heart and chronic kidney disease without heart failure, with stage 1 through stage 4 chronic kidney disease, or unspecified chronic kidney disease: Secondary | ICD-10-CM | POA: Diagnosis not present

## 2024-01-29 DIAGNOSIS — K219 Gastro-esophageal reflux disease without esophagitis: Secondary | ICD-10-CM | POA: Diagnosis not present

## 2024-01-29 DIAGNOSIS — Z556 Problems related to health literacy: Secondary | ICD-10-CM | POA: Diagnosis not present

## 2024-01-29 DIAGNOSIS — Z7982 Long term (current) use of aspirin: Secondary | ICD-10-CM | POA: Diagnosis not present

## 2024-01-29 DIAGNOSIS — N3090 Cystitis, unspecified without hematuria: Secondary | ICD-10-CM | POA: Diagnosis not present

## 2024-01-29 DIAGNOSIS — R4701 Aphasia: Secondary | ICD-10-CM | POA: Diagnosis not present

## 2024-01-29 DIAGNOSIS — G9340 Encephalopathy, unspecified: Secondary | ICD-10-CM | POA: Diagnosis not present

## 2024-01-29 DIAGNOSIS — E785 Hyperlipidemia, unspecified: Secondary | ICD-10-CM | POA: Diagnosis not present

## 2024-01-29 DIAGNOSIS — N179 Acute kidney failure, unspecified: Secondary | ICD-10-CM | POA: Diagnosis not present

## 2024-01-29 DIAGNOSIS — Z8673 Personal history of transient ischemic attack (TIA), and cerebral infarction without residual deficits: Secondary | ICD-10-CM | POA: Diagnosis not present

## 2024-01-29 LAB — CBC WITH DIFFERENTIAL/PLATELET
Basophils Absolute: 0.1 10*3/uL (ref 0.0–0.2)
Basos: 0 %
EOS (ABSOLUTE): 0.1 10*3/uL (ref 0.0–0.4)
Eos: 0 %
Hematocrit: 43.9 % (ref 34.0–46.6)
Hemoglobin: 14.8 g/dL (ref 11.1–15.9)
Immature Grans (Abs): 0.3 10*3/uL — ABNORMAL HIGH (ref 0.0–0.1)
Immature Granulocytes: 1 %
Lymphocytes Absolute: 27.5 10*3/uL — ABNORMAL HIGH (ref 0.7–3.1)
Lymphs: 72 %
MCH: 32.5 pg (ref 26.6–33.0)
MCHC: 33.7 g/dL (ref 31.5–35.7)
MCV: 96 fL (ref 79–97)
Monocytes Absolute: 1.6 10*3/uL — ABNORMAL HIGH (ref 0.1–0.9)
Monocytes: 4 %
Neutrophils Absolute: 8.9 10*3/uL — ABNORMAL HIGH (ref 1.4–7.0)
Neutrophils: 23 %
Platelets: 293 10*3/uL (ref 150–450)
RBC: 4.56 x10E6/uL (ref 3.77–5.28)
RDW: 11.8 % (ref 11.7–15.4)
WBC: 38.5 10*3/uL (ref 3.4–10.8)

## 2024-01-29 LAB — CMP14+EGFR
ALT: 11 IU/L (ref 0–32)
AST: 21 IU/L (ref 0–40)
Albumin: 4.1 g/dL (ref 3.6–4.6)
Alkaline Phosphatase: 88 IU/L (ref 44–121)
BUN/Creatinine Ratio: 18 (ref 12–28)
BUN: 29 mg/dL (ref 10–36)
Bilirubin Total: 0.4 mg/dL (ref 0.0–1.2)
CO2: 20 mmol/L (ref 20–29)
Calcium: 9.4 mg/dL (ref 8.7–10.3)
Chloride: 101 mmol/L (ref 96–106)
Creatinine, Ser: 1.57 mg/dL — ABNORMAL HIGH (ref 0.57–1.00)
Globulin, Total: 2.3 g/dL (ref 1.5–4.5)
Glucose: 97 mg/dL (ref 70–99)
Potassium: 4.9 mmol/L (ref 3.5–5.2)
Sodium: 141 mmol/L (ref 134–144)
Total Protein: 6.4 g/dL (ref 6.0–8.5)
eGFR: 30 mL/min/{1.73_m2} — ABNORMAL LOW (ref >59)

## 2024-01-30 ENCOUNTER — Other Ambulatory Visit: Payer: Self-pay | Admitting: Family

## 2024-01-30 ENCOUNTER — Inpatient Hospital Stay: Admitting: Family

## 2024-01-30 ENCOUNTER — Ambulatory Visit: Payer: Self-pay | Admitting: Family

## 2024-01-31 ENCOUNTER — Ambulatory Visit (INDEPENDENT_AMBULATORY_CARE_PROVIDER_SITE_OTHER)

## 2024-01-31 DIAGNOSIS — N1831 Chronic kidney disease, stage 3a: Secondary | ICD-10-CM | POA: Diagnosis not present

## 2024-01-31 DIAGNOSIS — N3090 Cystitis, unspecified without hematuria: Secondary | ICD-10-CM

## 2024-01-31 DIAGNOSIS — K219 Gastro-esophageal reflux disease without esophagitis: Secondary | ICD-10-CM

## 2024-01-31 DIAGNOSIS — G9340 Encephalopathy, unspecified: Secondary | ICD-10-CM

## 2024-01-31 DIAGNOSIS — I1310 Hypertensive heart and chronic kidney disease without heart failure, with stage 1 through stage 4 chronic kidney disease, or unspecified chronic kidney disease: Secondary | ICD-10-CM

## 2024-01-31 DIAGNOSIS — I252 Old myocardial infarction: Secondary | ICD-10-CM | POA: Diagnosis not present

## 2024-01-31 DIAGNOSIS — F419 Anxiety disorder, unspecified: Secondary | ICD-10-CM

## 2024-01-31 DIAGNOSIS — I421 Obstructive hypertrophic cardiomyopathy: Secondary | ICD-10-CM | POA: Diagnosis not present

## 2024-01-31 DIAGNOSIS — F32A Depression, unspecified: Secondary | ICD-10-CM

## 2024-01-31 DIAGNOSIS — R4701 Aphasia: Secondary | ICD-10-CM

## 2024-01-31 DIAGNOSIS — N179 Acute kidney failure, unspecified: Secondary | ICD-10-CM | POA: Diagnosis not present

## 2024-01-31 DIAGNOSIS — E785 Hyperlipidemia, unspecified: Secondary | ICD-10-CM

## 2024-02-07 DIAGNOSIS — K219 Gastro-esophageal reflux disease without esophagitis: Secondary | ICD-10-CM | POA: Diagnosis not present

## 2024-02-07 DIAGNOSIS — R4701 Aphasia: Secondary | ICD-10-CM | POA: Diagnosis not present

## 2024-02-07 DIAGNOSIS — E860 Dehydration: Secondary | ICD-10-CM | POA: Diagnosis not present

## 2024-02-07 DIAGNOSIS — I1310 Hypertensive heart and chronic kidney disease without heart failure, with stage 1 through stage 4 chronic kidney disease, or unspecified chronic kidney disease: Secondary | ICD-10-CM | POA: Diagnosis not present

## 2024-02-07 DIAGNOSIS — E785 Hyperlipidemia, unspecified: Secondary | ICD-10-CM | POA: Diagnosis not present

## 2024-02-07 DIAGNOSIS — Z8673 Personal history of transient ischemic attack (TIA), and cerebral infarction without residual deficits: Secondary | ICD-10-CM | POA: Diagnosis not present

## 2024-02-07 DIAGNOSIS — Z556 Problems related to health literacy: Secondary | ICD-10-CM | POA: Diagnosis not present

## 2024-02-07 DIAGNOSIS — I421 Obstructive hypertrophic cardiomyopathy: Secondary | ICD-10-CM | POA: Diagnosis not present

## 2024-02-07 DIAGNOSIS — N3090 Cystitis, unspecified without hematuria: Secondary | ICD-10-CM | POA: Diagnosis not present

## 2024-02-07 DIAGNOSIS — N179 Acute kidney failure, unspecified: Secondary | ICD-10-CM | POA: Diagnosis not present

## 2024-02-07 DIAGNOSIS — Z7982 Long term (current) use of aspirin: Secondary | ICD-10-CM | POA: Diagnosis not present

## 2024-02-07 DIAGNOSIS — G9340 Encephalopathy, unspecified: Secondary | ICD-10-CM | POA: Diagnosis not present

## 2024-02-07 DIAGNOSIS — N1831 Chronic kidney disease, stage 3a: Secondary | ICD-10-CM | POA: Diagnosis not present

## 2024-02-07 DIAGNOSIS — I252 Old myocardial infarction: Secondary | ICD-10-CM | POA: Diagnosis not present

## 2024-02-10 DIAGNOSIS — Z7982 Long term (current) use of aspirin: Secondary | ICD-10-CM | POA: Diagnosis not present

## 2024-02-10 DIAGNOSIS — Z556 Problems related to health literacy: Secondary | ICD-10-CM | POA: Diagnosis not present

## 2024-02-10 DIAGNOSIS — R4701 Aphasia: Secondary | ICD-10-CM | POA: Diagnosis not present

## 2024-02-10 DIAGNOSIS — N179 Acute kidney failure, unspecified: Secondary | ICD-10-CM | POA: Diagnosis not present

## 2024-02-10 DIAGNOSIS — I421 Obstructive hypertrophic cardiomyopathy: Secondary | ICD-10-CM | POA: Diagnosis not present

## 2024-02-10 DIAGNOSIS — Z8673 Personal history of transient ischemic attack (TIA), and cerebral infarction without residual deficits: Secondary | ICD-10-CM | POA: Diagnosis not present

## 2024-02-10 DIAGNOSIS — G9340 Encephalopathy, unspecified: Secondary | ICD-10-CM | POA: Diagnosis not present

## 2024-02-10 DIAGNOSIS — N3090 Cystitis, unspecified without hematuria: Secondary | ICD-10-CM | POA: Diagnosis not present

## 2024-02-10 DIAGNOSIS — I252 Old myocardial infarction: Secondary | ICD-10-CM | POA: Diagnosis not present

## 2024-02-10 DIAGNOSIS — I1310 Hypertensive heart and chronic kidney disease without heart failure, with stage 1 through stage 4 chronic kidney disease, or unspecified chronic kidney disease: Secondary | ICD-10-CM | POA: Diagnosis not present

## 2024-02-10 DIAGNOSIS — K219 Gastro-esophageal reflux disease without esophagitis: Secondary | ICD-10-CM | POA: Diagnosis not present

## 2024-02-10 DIAGNOSIS — E860 Dehydration: Secondary | ICD-10-CM | POA: Diagnosis not present

## 2024-02-10 DIAGNOSIS — E785 Hyperlipidemia, unspecified: Secondary | ICD-10-CM | POA: Diagnosis not present

## 2024-02-10 DIAGNOSIS — N1831 Chronic kidney disease, stage 3a: Secondary | ICD-10-CM | POA: Diagnosis not present

## 2024-02-17 DIAGNOSIS — Z556 Problems related to health literacy: Secondary | ICD-10-CM | POA: Diagnosis not present

## 2024-02-17 DIAGNOSIS — I1310 Hypertensive heart and chronic kidney disease without heart failure, with stage 1 through stage 4 chronic kidney disease, or unspecified chronic kidney disease: Secondary | ICD-10-CM | POA: Diagnosis not present

## 2024-02-17 DIAGNOSIS — R4701 Aphasia: Secondary | ICD-10-CM | POA: Diagnosis not present

## 2024-02-17 DIAGNOSIS — E785 Hyperlipidemia, unspecified: Secondary | ICD-10-CM | POA: Diagnosis not present

## 2024-02-17 DIAGNOSIS — N3090 Cystitis, unspecified without hematuria: Secondary | ICD-10-CM | POA: Diagnosis not present

## 2024-02-17 DIAGNOSIS — K219 Gastro-esophageal reflux disease without esophagitis: Secondary | ICD-10-CM | POA: Diagnosis not present

## 2024-02-17 DIAGNOSIS — Z8673 Personal history of transient ischemic attack (TIA), and cerebral infarction without residual deficits: Secondary | ICD-10-CM | POA: Diagnosis not present

## 2024-02-17 DIAGNOSIS — I252 Old myocardial infarction: Secondary | ICD-10-CM | POA: Diagnosis not present

## 2024-02-17 DIAGNOSIS — N179 Acute kidney failure, unspecified: Secondary | ICD-10-CM | POA: Diagnosis not present

## 2024-02-17 DIAGNOSIS — Z7982 Long term (current) use of aspirin: Secondary | ICD-10-CM | POA: Diagnosis not present

## 2024-02-17 DIAGNOSIS — G9340 Encephalopathy, unspecified: Secondary | ICD-10-CM | POA: Diagnosis not present

## 2024-02-17 DIAGNOSIS — N1831 Chronic kidney disease, stage 3a: Secondary | ICD-10-CM | POA: Diagnosis not present

## 2024-02-17 DIAGNOSIS — I421 Obstructive hypertrophic cardiomyopathy: Secondary | ICD-10-CM | POA: Diagnosis not present

## 2024-02-17 DIAGNOSIS — E860 Dehydration: Secondary | ICD-10-CM | POA: Diagnosis not present

## 2024-02-24 ENCOUNTER — Inpatient Hospital Stay: Attending: Hematology | Admitting: Hematology

## 2024-03-08 ENCOUNTER — Other Ambulatory Visit: Payer: Self-pay | Admitting: Family

## 2024-03-08 DIAGNOSIS — F3342 Major depressive disorder, recurrent, in full remission: Secondary | ICD-10-CM

## 2024-03-08 DIAGNOSIS — F411 Generalized anxiety disorder: Secondary | ICD-10-CM

## 2024-03-08 DIAGNOSIS — E441 Mild protein-calorie malnutrition: Secondary | ICD-10-CM

## 2024-03-08 DIAGNOSIS — R630 Anorexia: Secondary | ICD-10-CM

## 2024-03-08 DIAGNOSIS — G4700 Insomnia, unspecified: Secondary | ICD-10-CM

## 2024-03-29 ENCOUNTER — Other Ambulatory Visit: Payer: Self-pay | Admitting: Hematology

## 2024-03-29 DIAGNOSIS — C9110 Chronic lymphocytic leukemia of B-cell type not having achieved remission: Secondary | ICD-10-CM

## 2024-03-30 NOTE — Telephone Encounter (Signed)
 This rx was sent to us .

## 2024-03-31 ENCOUNTER — Encounter: Payer: Self-pay | Admitting: Family

## 2024-03-31 ENCOUNTER — Ambulatory Visit: Admitting: Family

## 2024-04-03 ENCOUNTER — Other Ambulatory Visit: Payer: Self-pay | Admitting: Nurse Practitioner

## 2024-04-17 ENCOUNTER — Ambulatory Visit: Admitting: Family

## 2024-05-08 ENCOUNTER — Ambulatory Visit: Admitting: Family

## 2024-05-08 VITALS — BP 106/72 | HR 102 | Temp 97.6°F | Ht 63.0 in | Wt 126.2 lb

## 2024-05-08 DIAGNOSIS — Z0001 Encounter for general adult medical examination with abnormal findings: Secondary | ICD-10-CM

## 2024-05-08 DIAGNOSIS — C9110 Chronic lymphocytic leukemia of B-cell type not having achieved remission: Secondary | ICD-10-CM

## 2024-05-08 DIAGNOSIS — E441 Mild protein-calorie malnutrition: Secondary | ICD-10-CM

## 2024-05-08 DIAGNOSIS — F3342 Major depressive disorder, recurrent, in full remission: Secondary | ICD-10-CM

## 2024-05-08 DIAGNOSIS — F411 Generalized anxiety disorder: Secondary | ICD-10-CM | POA: Diagnosis not present

## 2024-05-08 DIAGNOSIS — Z0000 Encounter for general adult medical examination without abnormal findings: Secondary | ICD-10-CM

## 2024-05-08 MED ORDER — MIRTAZAPINE 7.5 MG PO TABS: 7.5000 mg | ORAL_TABLET | Freq: Every day | ORAL | 2 refills | Status: DC

## 2024-05-08 MED ORDER — MEGESTROL ACETATE 40 MG/ML PO SUSP: 400.0000 mg | Freq: Two times a day (BID) | ORAL | 3 refills | Status: DC

## 2024-05-08 NOTE — Patient Instructions (Signed)

## 2024-05-08 NOTE — Progress Notes (Signed)
 Subjective:    Patient ID: [name] Williams, female    DOB: 12/05/1927, 89 y.o.   MRN: [id number] Chief Complaint  Patient presents with   Medical Management of Chronic Issues   Hip Pain    BECOMING HARDER TO WALK. FEELS WEAK AT TIMES LIKE SHE WILL PASS OUT    PT presents to the office today for CPE and weakness.   She reports depression, decrease appetite, and weight loss. She has been seen multiple times for these symptoms and has taken remeron  15 mg in the past that helped. She stopped this because she does not like taking medications.  She is a poor historian. She does live at home by herself, but her family checks on her regularly.   She has CLL and followed by Oncologists as needed. Pt does not wish to do further treatment with this.     05/08/2024    1:43 PM 01/28/2024   12:02 PM 01/21/2024    4:37 AM  Last 3 Weights  Weight (lbs) 126 lb 3.2 oz 128 lb 6.4 oz 126 lb 1.7 oz  Weight (kg) 57.244 kg 58.242 kg 57.2 kg      Depression        This is a chronic problem.  The current episode started more than 1 year ago.   Associated symptoms include fatigue, helplessness, hopelessness, restlessness, decreased interest and sad.  Associated symptoms include no suicidal ideas.  Past treatments include nothing.  Past medical history includes anxiety.   Anxiety Presents for follow-up visit. Symptoms include excessive worry, nervous/anxious behavior and restlessness. Patient reports no suicidal ideas. Symptoms occur occasionally. The severity of symptoms is mild.    Hip Pain  The incident occurred more than 1 week ago. The pain is present in the left hip. The quality of the pain is described as aching. The patient is experiencing no pain. The pain has been Intermittent since onset. She reports no foreign bodies present. She has tried nothing for the symptoms. The treatment provided moderate relief.      Review of Systems  Constitutional:  Positive for fatigue.  Psychiatric/Behavioral:   Negative for suicidal ideas. The patient is nervous/anxious.   All other systems reviewed and are negative.   Social History   Socioeconomic History   Marital status: Widowed    Spouse name: Not on file   Number of children: 3   Years of education: Not on file   Highest education level: 12th grade  Occupational History   Not on file  Tobacco Use   Smoking status: Some Days    Types: Cigarettes   Smokeless tobacco: Never   Tobacco comments:    quit 2012  Vaping Use   Vaping status: Never Used  Substance and Sexual Activity   Alcohol use: No   Drug use: No   Sexual activity: Not Currently  Other Topics Concern   Not on file  Social History Narrative   Patient is retired.    Patient is widowed.    Patient has 3 children.    Patient is right handed.    Pt lives with son.     Social Drivers of Corporate investment banker Strain: Low Risk  (05/08/2024)   Overall Financial Resource Strain (CARDIA)    Difficulty of Paying Living Expenses: Not very hard  Food Insecurity: No Food Insecurity (05/08/2024)   Hunger Vital Sign    Worried About Running Out of Food in the Last Year: Never true  Ran Out of Food in the Last Year: Never true  Transportation Needs: No Transportation Needs (05/08/2024)   PRAPARE - Administrator, Civil Service (Medical): No    Lack of Transportation (Non-Medical): No  Physical Activity: Inactive (05/08/2024)   Exercise Vital Sign    Days of Exercise per Week: 0 days    Minutes of Exercise per Session: Not on file  Stress: Stress Concern Present (05/08/2024)   [employer] of Occupational Health - Occupational Stress Questionnaire    Feeling of Stress: Very much  Social Connections: Socially Isolated (05/08/2024)   Social Connection and Isolation Panel    Frequency of Communication with Friends and Family: Never    Frequency of Social Gatherings with Friends and Family: Once a week    Attends Religious Services: 1 to 4 times per year     Active Member of [employer] or Organizations: No    Attends Banker Meetings: Not on file    Marital Status: Widowed   Family History  Problem Relation Age of Onset   Breast cancer Mother    Stomach cancer Sister    Uterine cancer Sister    Depression Sister    Cancer Brother    Alcohol abuse Son         Objective:   Physical Exam Vitals reviewed.  Constitutional:      General: She is not in acute distress.    Appearance: She is well-developed.  HENT:     Head: Normocephalic and atraumatic.     Right Ear: Tympanic membrane normal.     Left Ear: Tympanic membrane normal.  Eyes:     Pupils: Pupils are equal, round, and reactive to light.  Neck:     Thyroid : No thyromegaly.  Cardiovascular:     Rate and Rhythm: Normal rate and regular rhythm.     Heart sounds: Normal heart sounds. No murmur heard. Pulmonary:     Effort: Pulmonary effort is normal. No respiratory distress.     Breath sounds: Normal breath sounds. No wheezing.  Abdominal:     General: Bowel sounds are normal. There is no distension.     Palpations: Abdomen is soft.     Tenderness: There is no abdominal tenderness.  Musculoskeletal:        General: No tenderness. Normal range of motion.     Cervical back: Normal range of motion and neck supple.     Right lower leg: Edema (trace) present.  Skin:    General: Skin is warm and dry.     Findings: Bruising present.  Neurological:     Mental Status: She is alert and oriented to person, place, and time.     Cranial Nerves: No cranial nerve deficit.     Deep Tendon Reflexes: Reflexes are normal and symmetric.  Psychiatric:        Behavior: Behavior normal.        Thought Content: Thought content normal.        Judgment: Judgment normal.       BP 106/72   Pulse (!) 102   Temp 97.6 F (36.4 C)   Ht 5' 3 (1.6 m)   Wt 126 lb 3.2 oz (57.2 kg)   SpO2 98%   BMI 22.36 kg/m      Assessment & Plan:  [name] Williams comes in today with chief  complaint of Medical Management of Chronic Issues and Hip Pain (BECOMING HARDER TO WALK. FEELS WEAK AT TIMES LIKE SHE  WILL PASS OUT)   Diagnosis and orders addressed:  1. Annual physical exam (Primary) - CMP14+EGFR - CBC with Differential/Platelet - TSH  2. Recurrent major depressive disorder, in full remission (HCC) - mirtazapine  (REMERON ) 7.5 MG tablet; Take 1 tablet (7.5 mg total) by mouth at bedtime.  Dispense: 90 tablet; Refill: 2 - CMP14+EGFR  3. GAD (generalized anxiety disorder) - mirtazapine  (REMERON ) 7.5 MG tablet; Take 1 tablet (7.5 mg total) by mouth at bedtime.  Dispense: 90 tablet; Refill: 2 - CMP14+EGFR  4. CLL (chronic lymphocytic leukemia) (HCC) - megestrol  (MEGACE ) 40 MG/ML suspension; Take 10 mLs (400 mg total) by mouth 2 (two) times daily.  Dispense: 480 mL; Refill: 3 - CMP14+EGFR  5. Mild protein-calorie malnutrition (HCC) - mirtazapine  (REMERON ) 7.5 MG tablet; Take 1 tablet (7.5 mg total) by mouth at bedtime.  Dispense: 90 tablet; Refill: 2 - megestrol  (MEGACE ) 40 MG/ML suspension; Take 10 mLs (400 mg total) by mouth 2 (two) times daily.  Dispense: 480 mL; Refill: 3 - CMP14+EGFR - TSH    Labs pending Restart Remeron  7.5 mg daily Encourage high protein diet  Continue current medications  Health Maintenance reviewed Diet and exercise encouraged  Return in about 4 months (around 09/07/2024), or if symptoms worsen or fail to improve.    Bari Learn, FNP

## 2024-05-09 LAB — CMP14+EGFR
ALT: 5 IU/L (ref 0–32)
AST: 15 IU/L (ref 0–40)
Albumin: 4 g/dL (ref 3.6–4.6)
Alkaline Phosphatase: 85 IU/L (ref 48–129)
BUN/Creatinine Ratio: 9 — ABNORMAL LOW (ref 12–28)
BUN: 15 mg/dL (ref 10–36)
Bilirubin Total: 1.1 mg/dL (ref 0.0–1.2)
CO2: 20 mmol/L (ref 20–29)
Calcium: 9.3 mg/dL (ref 8.7–10.3)
Chloride: 99 mmol/L (ref 96–106)
Creatinine, Ser: 1.65 mg/dL — ABNORMAL HIGH (ref 0.57–1.00)
Globulin, Total: 2.5 g/dL (ref 1.5–4.5)
Glucose: 157 mg/dL — ABNORMAL HIGH (ref 70–99)
Potassium: 4.5 mmol/L (ref 3.5–5.2)
Sodium: 136 mmol/L (ref 134–144)
Total Protein: 6.5 g/dL (ref 6.0–8.5)
eGFR: 29 mL/min/1.73 — ABNORMAL LOW (ref >59)

## 2024-05-09 LAB — CBC WITH DIFFERENTIAL/PLATELET
Basophils Absolute: 0.1 x10E3/uL (ref 0.0–0.2)
Basos: 0 %
EOS (ABSOLUTE): 0.1 x10E3/uL (ref 0.0–0.4)
Eos: 0 %
Hematocrit: 46.3 % (ref 34.0–46.6)
Hemoglobin: 15.2 g/dL (ref 11.1–15.9)
Immature Grans (Abs): 0 x10E3/uL (ref 0.0–0.1)
Immature Granulocytes: 0 %
Lymphocytes Absolute: 17 x10E3/uL — ABNORMAL HIGH (ref 0.7–3.1)
Lymphs: 70 %
MCH: 31.1 pg (ref 26.6–33.0)
MCHC: 32.8 g/dL (ref 31.5–35.7)
MCV: 95 fL (ref 79–97)
Monocytes Absolute: 1.3 x10E3/uL — ABNORMAL HIGH (ref 0.1–0.9)
Monocytes: 5 %
Neutrophils Absolute: 6 x10E3/uL (ref 1.4–7.0)
Neutrophils: 25 %
Platelets: 239 x10E3/uL (ref 150–450)
RBC: 4.89 x10E6/uL (ref 3.77–5.28)
RDW: 11.8 % (ref 11.7–15.4)
WBC: 24.6 x10E3/uL (ref 3.4–10.8)

## 2024-05-09 LAB — TSH: TSH: 4.75 u[IU]/mL — ABNORMAL HIGH (ref 0.450–4.500)

## 2024-05-12 ENCOUNTER — Ambulatory Visit: Payer: Self-pay | Admitting: Family

## 2024-06-04 ENCOUNTER — Other Ambulatory Visit: Payer: Self-pay | Admitting: Family

## 2024-06-23 ENCOUNTER — Emergency Department (HOSPITAL_COMMUNITY)

## 2024-06-23 ENCOUNTER — Other Ambulatory Visit: Payer: Self-pay

## 2024-06-23 ENCOUNTER — Observation Stay (HOSPITAL_COMMUNITY)
Admission: EM | Admit: 2024-06-23 | Discharge: 2024-06-26 | Disposition: A | Discharge status: Skilled Nursing Facility | Attending: Internal Medicine | Admitting: Internal Medicine

## 2024-06-23 ENCOUNTER — Encounter (HOSPITAL_COMMUNITY): Payer: Self-pay | Admitting: *Deleted

## 2024-06-23 ENCOUNTER — Other Ambulatory Visit: Payer: Self-pay | Admitting: Family

## 2024-06-23 DIAGNOSIS — I10 Essential (primary) hypertension: Secondary | ICD-10-CM | POA: Insufficient documentation

## 2024-06-23 DIAGNOSIS — M25421 Effusion, right elbow: Secondary | ICD-10-CM | POA: Diagnosis not present

## 2024-06-23 DIAGNOSIS — Z7982 Long term (current) use of aspirin: Secondary | ICD-10-CM | POA: Insufficient documentation

## 2024-06-23 DIAGNOSIS — F17210 Nicotine dependence, cigarettes, uncomplicated: Secondary | ICD-10-CM | POA: Insufficient documentation

## 2024-06-23 DIAGNOSIS — W19XXXA Unspecified fall, initial encounter: Principal | ICD-10-CM | POA: Insufficient documentation

## 2024-06-23 DIAGNOSIS — Z91040 Latex allergy status: Secondary | ICD-10-CM | POA: Insufficient documentation

## 2024-06-23 DIAGNOSIS — I482 Chronic atrial fibrillation, unspecified: Secondary | ICD-10-CM | POA: Insufficient documentation

## 2024-06-23 DIAGNOSIS — G459 Transient cerebral ischemic attack, unspecified: Secondary | ICD-10-CM

## 2024-06-23 DIAGNOSIS — Z79899 Other long term (current) drug therapy: Secondary | ICD-10-CM | POA: Insufficient documentation

## 2024-06-23 DIAGNOSIS — S51011A Laceration without foreign body of right elbow, initial encounter: Secondary | ICD-10-CM | POA: Diagnosis not present

## 2024-06-23 LAB — CBC WITH DIFFERENTIAL/PLATELET
Basophils Absolute: 0 K/uL (ref 0.0–0.1)
Basophils Relative: 0 %
Eosinophils Absolute: 0 K/uL (ref 0.0–0.5)
Eosinophils Relative: 0 %
HCT: 46.4 % — ABNORMAL HIGH (ref 36.0–46.0)
Hemoglobin: 15 g/dL (ref 12.0–15.0)
Lymphocytes Relative: 49 %
Lymphs Abs: 10.8 K/uL — ABNORMAL HIGH (ref 0.7–4.0)
MCH: 30.5 pg (ref 26.0–34.0)
MCHC: 32.3 g/dL (ref 30.0–36.0)
MCV: 94.3 fL (ref 80.0–100.0)
Monocytes Absolute: 0.7 K/uL (ref 0.1–1.0)
Monocytes Relative: 3 %
Neutro Abs: 10.6 K/uL — ABNORMAL HIGH (ref 1.7–7.7)
Neutrophils Relative %: 48 %
Platelets: 241 K/uL (ref 150–400)
RBC: 4.92 MIL/uL (ref 3.87–5.11)
RDW: 13.2 % (ref 11.5–15.5)
WBC: 22 K/uL — ABNORMAL HIGH (ref 4.0–10.5)
nRBC: 0 % (ref 0.0–0.2)

## 2024-06-23 LAB — COMPREHENSIVE METABOLIC PANEL WITH GFR
ALT: <5 U/L (ref 0–44)
AST: 25 U/L (ref 15–41)
Albumin: 3.6 g/dL (ref 3.5–5.0)
Alkaline Phosphatase: 82 U/L (ref 38–126)
Anion gap: 12 (ref 5–15)
BUN: 12 mg/dL (ref 8–23)
CO2: 24 mmol/L (ref 22–32)
Calcium: 9.1 mg/dL (ref 8.9–10.3)
Chloride: 103 mmol/L (ref 98–111)
Creatinine, Ser: 1.32 mg/dL — ABNORMAL HIGH (ref 0.44–1.00)
GFR, Estimated: 37 mL/min — ABNORMAL LOW (ref >60)
Glucose, Bld: 105 mg/dL — ABNORMAL HIGH (ref 70–99)
Potassium: 4.3 mmol/L (ref 3.5–5.1)
Sodium: 139 mmol/L (ref 135–145)
Total Bilirubin: 1.6 mg/dL — ABNORMAL HIGH (ref 0.0–1.2)
Total Protein: 6.2 g/dL — ABNORMAL LOW (ref 6.5–8.1)

## 2024-06-23 LAB — URINALYSIS, ROUTINE W REFLEX MICROSCOPIC
Bacteria, UA: NONE SEEN
Bilirubin Urine: NEGATIVE
Glucose, UA: NEGATIVE mg/dL
Ketones, ur: NEGATIVE mg/dL
Leukocytes,Ua: NEGATIVE
Nitrite: NEGATIVE
Protein, ur: 30 mg/dL — AB
Specific Gravity, Urine: 1.023 (ref 1.005–1.030)
pH: 5 (ref 5.0–8.0)

## 2024-06-23 NOTE — ED Provider Notes (Signed)
   Patient signed out to me by Sherran Barks, PA-C pending reevaluation, urinalysis and x-ray of the shoulder and CT head  Patient here with history of CLL and atrial fibrillation with prior TIA.  Does not take blood thinners.  Lives by herself and fell earlier today at department store.  She has dementia at baseline but states that she did not hit her head or lose consciousness.  She complains of shoulder and right elbow pain.  Workup concerning for mechanical fall versus TIA.  No mental status change per previous provider note.  See previous provider note for complete H&P   On my exam, patient appears to be mentating well.  Complains of some elbow pain.  A-fib on the monitor but appears rate controlled.  Workup without infection CT head without acute intracranial injury.   EKG shows atrial fibrillation with a rate of 95.   Discussed findings with [facility] hospital[facility], Dr. Manfred who agrees to admit with plan for MRI in the a.m.       Herlinda Milling, PA-C 06/23/24 [id number]    Melvenia Motto, MD 06/24/24 [phone]

## 2024-06-23 NOTE — H&P (Incomplete)
  History and Physical    Patient: [name] Williams FMW:[id number] DOB: 12/24/1927 DOA: 06/23/2024 DOS: the patient was seen and examined on 06/23/2024 PCP: Lavell Bari LABOR, FNP  Patient coming from: {Point_of_Origin:[address]}  Chief Complaint:  Chief Complaint  Patient presents with   Fall   HPI: DORLA GUIZAR is a 89 y.o. female with medical history significant of ***  Review of Systems: {ROS_Text:26778} Past Medical History:  Diagnosis Date   Anxiety    Depressive disorder, not elsewhere classified    Esophageal reflux    Essential hypertension, benign    Hypertr obst cardiomyop    Other and unspecified hyperlipidemia    Stroke (HCC)    Unspecified transient cerebral ischemia    Past Surgical History:  Procedure Laterality Date   BLADDER REPAIR     CERVIX SURGERY     CHOLECYSTECTOMY     TONSILLECTOMY     TUBAL LIGATION     Social History:  reports that she has been smoking cigarettes. She has never used smokeless tobacco. She reports that she does not drink alcohol and does not use drugs.  Allergies  Allergen Reactions   Latex Other (See Comments)    Per pt 11-10-14 she do not remember being allergic to this    Family History  Problem Relation Age of Onset   Breast cancer Mother    Stomach cancer Sister    Uterine cancer Sister    Depression Sister    Cancer Brother    Alcohol abuse Son     Prior to Admission medications   Medication Sig Start Date End Date Taking? Authorizing Provider  ASPIRIN  LOW DOSE 81 MG tablet Take 81 mg by mouth daily. 03/09/24  Yes [provider]  Multiple Vitamin (MULTIVITAMIN) tablet Take 1 tablet by mouth daily.   Yes [provider]    Physical Exam: Vitals:   06/23/24 1756 06/23/24 1815 06/23/24 1930 06/23/24 2100  BP:  (!) 141/74 134/68 102/73  Pulse: 92 86 88 75  Resp: 14 (!) 25 (!) 24 (!) 24  Temp:      SpO2: 99% 97% 97% 95%  Weight:      Height:       *** Data Reviewed: {Tip this will not be part of  the note when signed- Document your independent interpretation of telemetry tracing, EKG, lab, Radiology test or any other diagnostic tests. Add any new diagnostic test ordered today. (Optional):26781} {Results:26384}  Assessment and Plan: No notes have been filed under this hospital service. Service: Hospitalist     Advance Care Planning:   Code Status: Prior ***  Consults: ***  Family Communication: ***  Severity of Illness: {Observation/Inpatient:21159}  Author: Posey Maier, DO 06/23/2024 11:44 PM  For on call review [email or web address].

## 2024-06-23 NOTE — ED Provider Notes (Signed)
 [place] EMERGENCY DEPARTMENT AT [facility] Provider Note   CSN: [id number] Arrival date & time: 06/23/24  1342     Patient presents with: [name] Williams is a 89 y.o. female.  She has history of hokum, anxiety, CLL, atrial fibrillation, TIA, high cholesterol.  She lives by herself and still drives.  She is brought in today by her son.  He received a phone call that she had fallen while at a store and injured her right elbow, unsure if she had head injury or other injuries, patient was able to ambulate there and refused EMS transport so he was called to pick her up and brings her in for further evaluation.  He states she is at her baseline but her dementia has been worsening over the past few months.  Patient denies pain outside of her elbow, she is not on blood thinners.  She is not sure how she fell.    Fall       Prior to Admission medications   Medication Sig Start Date End Date Taking? Authorizing Provider  ASPIRIN  LOW DOSE 81 MG tablet Take 81 mg by mouth daily. 03/09/24   [provider]  megestrol  (MEGACE ) 40 MG/ML suspension Take 10 mLs (400 mg total) by mouth 2 (two) times daily. 05/08/24   Lavell Lye A, FNP  mirtazapine  (REMERON ) 7.5 MG tablet Take 1 tablet (7.5 mg total) by mouth at bedtime. 05/08/24   Lavell Lye LABOR, FNP    Allergies: Latex    Review of Systems  Updated Vital Signs BP 134/88   Pulse 96   Temp (!) 97 F (36.1 C)   Resp 16   Ht 5' 3 (1.6 m)   Wt 55.3 kg   SpO2 97%   BMI 21.61 kg/m   Physical Exam Vitals and nursing note reviewed.  Constitutional:      General: She is not in acute distress.    Appearance: She is well-developed.  HENT:     Head: Normocephalic and atraumatic.     Nose: Nose normal.     Mouth/Throat:     Mouth: Mucous membranes are moist.  Eyes:     Extraocular Movements: Extraocular movements intact.     Conjunctiva/sclera: Conjunctivae normal.     Pupils: Pupils are equal, round, and  reactive to light.  Cardiovascular:     Rate and Rhythm: Normal rate and regular rhythm.     Heart sounds: No murmur heard. Pulmonary:     Effort: Pulmonary effort is normal. No respiratory distress.     Breath sounds: Normal breath sounds.  Abdominal:     Palpations: Abdomen is soft.     Tenderness: There is no abdominal tenderness.  Musculoskeletal:        General: No swelling.     Cervical back: Normal range of motion and neck supple. No tenderness.     Comments:  pain with range of motion of right elbow but range of motion is intact.  There is mild swelling.  No tenderness of the forearm, wrist or hand and no shortness of the right shoulder.  Right radial pulse is intact.  Skin:    General: Skin is warm and dry.     Capillary Refill: Capillary refill takes less than 2 seconds.     Comments: Sick centimeter linear skin tear to right elbow that is superficial  Neurological:     General: No focal deficit present.     Mental Status: She is alert  and oriented to person, place, and time.  Psychiatric:        Mood and Affect: Mood normal.     (all labs ordered are listed, but only abnormal results are displayed) Labs Reviewed - No data to display  EKG: None  Radiology: No results found.   .Laceration Repair  Date/Time: 06/23/2024 7:10 PM  Performed by: [name] Sherran LABOR, PA-C Authorized by: [name] Sherran LABOR, PA-C   Consent:    Consent obtained:  Verbal   Consent given by:  Patient   Risks discussed:  Infection, pain and poor wound healing Universal protocol:    Imaging studies available: yes     Patient identity confirmed:  Verbally with patient Anesthesia:    Anesthesia method:  None Laceration details:    Location:  Shoulder/arm   Shoulder/arm location:  R elbow   Length (cm):  6 Pre-procedure details:    Preparation:  Imaging obtained to evaluate for foreign bodies Exploration:    Hemostasis achieved with:  Direct pressure   Imaging obtained: x-ray      Imaging outcome: foreign body not noted     Contaminated: yes   Treatment:    Area cleansed with:  Saline   Amount of cleaning:  Standard   Irrigation solution:  Sterile saline   Irrigation method:  Syringe Skin repair:    Repair method:  Steri-Strips   Number of Steri-Strips:  4 Approximation:    Approximation:  Loose Repair type:    Repair type:  Simple Post-procedure details:    Dressing: gauze.   Procedure completion:  Tolerated well, no immediate complications    Medications Ordered in the ED - No data to display                                  Medical Decision Making Differential diagnosis includes but not limited to UTI, electrolyte disturbance, intracranial hemorrhage, CVA, fracture, sprain, strain, other  ED course: Patient presents ER for injury after a fall, fell onto right elbow with skin tear which was repaired.  Denies head injury, unsure how she fell.  She is not on blood thinners, has history of A-fib.  EKG shows A-fib which is rate controlled.  Obtain labs and urine which are in process.  X-ray right elbow shows small joint effusion cannot rule out occult fracture.  Will put in a long-arm splint and have her follow-up orthopedics.  She is now complaining of more shoulder pain so we will obtain x-ray of her shoulder as well.  CT of head is pending also.  At this time pending labs and CT head and right shoulder x-ray, will sign out to [employer], PA-C.  Amount and/or Complexity of Data Reviewed Labs: ordered. Radiology: ordered.        Final diagnoses:  None    ED Discharge Orders     None          [name] Williams [name] Williams 06/23/24 1918    [name] Motto, MD 06/24/24 [phone]

## 2024-06-23 NOTE — ED Triage Notes (Signed)
 Pt fell today while at [employer]. Son with pt and unaware of how pt fell. Son states pt is at baseline, ems checked pt out and pt refused to go by ems. Pt has dementia per son. Pt with skin tear to rt elbow. Pt states she tripped on the curb. Pt alert and oriented to place, month but not year

## 2024-06-24 ENCOUNTER — Observation Stay (HOSPITAL_COMMUNITY)

## 2024-06-24 DIAGNOSIS — W19XXXA Unspecified fall, initial encounter: Secondary | ICD-10-CM | POA: Diagnosis not present

## 2024-06-24 DIAGNOSIS — S51011A Laceration without foreign body of right elbow, initial encounter: Secondary | ICD-10-CM | POA: Diagnosis not present

## 2024-06-24 MED ORDER — ENOXAPARIN SODIUM 30 MG/0.3ML IJ SOSY
30.0000 mg | PREFILLED_SYRINGE | INTRAMUSCULAR | Status: DC
  Administered 2024-06-24 – 2024-06-26 (×3): 30 mg via SUBCUTANEOUS
  Filled 2024-06-24 (×3): qty 0.3

## 2024-06-24 MED ORDER — LORAZEPAM 2 MG/ML IJ SOLN: 0.5000 mg | Freq: Once | INTRAMUSCULAR | Status: DC | PRN

## 2024-06-24 MED ORDER — ENSURE PLUS HIGH PROTEIN PO LIQD
237.0000 mL | Freq: Two times a day (BID) | ORAL | Status: DC
  Administered 2024-06-26: 237 mL via ORAL
  Filled 2024-06-24 (×4): qty 237

## 2024-06-24 MED ORDER — ACETAMINOPHEN 650 MG RE SUPP: 650.0000 mg | Freq: Four times a day (QID) | RECTAL | Status: DC | PRN

## 2024-06-24 MED ORDER — ACETAMINOPHEN 325 MG PO TABS: 650.0000 mg | ORAL_TABLET | Freq: Four times a day (QID) | ORAL | Status: DC | PRN

## 2024-06-24 MED ORDER — ONDANSETRON HCL 4 MG PO TABS: 4.0000 mg | ORAL_TABLET | Freq: Four times a day (QID) | ORAL | Status: DC | PRN

## 2024-06-24 MED ORDER — ONDANSETRON HCL 4 MG/2ML IJ SOLN: 4.0000 mg | Freq: Four times a day (QID) | INTRAMUSCULAR | Status: DC | PRN

## 2024-06-24 MED ORDER — LORAZEPAM 2 MG/ML IJ SOLN: 0.5000 mg | Freq: Once | INTRAMUSCULAR | Status: DC

## 2024-06-24 NOTE — Progress Notes (Addendum)
 PROGRESS NOTE    [name] Williams  FMW:[id number] DOB: 25-May-1928 DOA: 06/23/2024 PCP: Lavell Bari LABOR, FNP   Brief Narrative:   89 y.o. female with medical history significant of CLL, CKD 3B, dementia, prior TIA, atrial fibrillation not on anticoagulant who presents to the emergency department after sustaining a fall at a department store.  She states that she tripped on the curb and sustained a skin tear to the right elbow. Right elbow x-ray showed possible small elbow effusion.  Occult fracture not included CT head without contrast showed no acute intracranial abnormality A long-arm splint was placed and patient was asked to follow-up with orthopedics as an outpatient. Pending PT/OT evaluations.   Assessment & Plan:  Principal Problem:   Fall   Mechanical fall,POA:  No fractures  Physical deconditioning,POA: -F/u PT/OT evaluations  Ruled out acute ischemic CVA: MRI brain and CT head are unremarkable Concern for possibe TIA  Right elbow laceration: Sling in place. Outpatient orthopedics follow up.  CLL: No acute issues  CKD 3B,POA: No acute issues  Disposition: Lives at home and is IADL.       DVT prophylaxis: enoxaparin (LOVENOX) injection 30 mg Start: 06/24/24 0700 SCDs Start: 06/24/24 9370     Code Status: Full Code Family Communication:  None at the bedside Status is: Observation The patient remains OBS appropriate and will d/c before 2 midnights.    Subjective:  Denied any active complaints. She lives at home by herself and is IADL. Her son lives in the basement and has issues with alcohol abuse.   Examination:  General exam: Appears calm and comfortable  Respiratory system: Clear to auscultation. Respiratory effort normal. Cardiovascular system: S1 & S2 heard, RRR. No JVD, murmurs, rubs, gallops or clicks. No pedal edema. Gastrointestinal system: Abdomen is nondistended, soft and nontender. No organomegaly or masses felt. Normal bowel sounds  heard. Central nervous system: Alert and oriented. No focal neurological deficits. Extremities: Right elbow in sling Skin: No rashes, lesions or ulcers Psychiatry: Judgement and insight appear normal. Mood & affect appropriate.     Diet Orders (From admission, onward)     Start     Ordered   06/24/24 0750  Diet regular Room service appropriate? Yes; Fluid consistency: Thin  Diet effective now       Question Answer Comment  Room service appropriate? Yes   Fluid consistency: Thin      06/24/24 0749            Objective: Vitals:   06/23/24 2100 06/24/24 0353 06/24/24 0700 06/24/24 0823  BP: 102/73 122/73 137/64 104/87  Pulse: 75 84 67 97  Resp: (!) 24 20 (!) 24 20  Temp:  98.5 F (36.9 C) 98 F (36.7 C) 98.1 F (36.7 C)  TempSrc:  Oral Oral Oral  SpO2: 95% 96% 97% 95%  Weight:      Height:       No intake or output data in the 24 hours ending 06/24/24 1012 Filed Weights   06/23/24 1400  Weight: 55.3 kg    Scheduled Meds:  enoxaparin (LOVENOX) injection  30 mg Subcutaneous Q24H   feeding supplement  237 mL Oral BID BM   Continuous Infusions:  Nutritional status     Body mass index is 21.61 kg/m.  Data Reviewed:   CBC: Recent Labs  Lab 06/23/24 1916  WBC 22.0*  NEUTROABS 10.6*  HGB 15.0  HCT 46.4*  MCV 94.3  PLT 241   Basic Metabolic Panel: Recent Labs  Lab 06/23/24 1916  NA 139  K 4.3  CL 103  CO2 24  GLUCOSE 105*  BUN 12  CREATININE 1.32*  CALCIUM 9.1   GFR: Estimated Creatinine Clearance: 21.6 mL/min (A) (by C-G formula based on SCr of 1.32 mg/dL (H)). Liver Function Tests: Recent Labs  Lab 06/23/24 1916  AST 25  ALT <5  ALKPHOS 82  BILITOT 1.6*  PROT 6.2*  ALBUMIN 3.6   No results for input(s): LIPASE, AMYLASE in the last 168 hours. No results for input(s): AMMONIA in the last 168 hours. Coagulation Profile: No results for input(s): INR, PROTIME in the last 168 hours. Cardiac Enzymes: No results for  input(s): CKTOTAL, CKMB, CKMBINDEX, TROPONINI in the last 168 hours. BNP (last 3 results) No results for input(s): PROBNP in the last 8760 hours. HbA1C: No results for input(s): HGBA1C in the last 72 hours. CBG: No results for input(s): GLUCAP in the last 168 hours. Lipid Profile: No results for input(s): CHOL, HDL, LDLCALC, TRIG, CHOLHDL, LDLDIRECT in the last 72 hours. Thyroid  Function Tests: No results for input(s): TSH, T4TOTAL, FREET4, T3FREE, THYROIDAB in the last 72 hours. Anemia Panel: No results for input(s): VITAMINB12, FOLATE, FERRITIN, TIBC, IRON, RETICCTPCT in the last 72 hours. Sepsis Labs: No results for input(s): PROCALCITON, LATICACIDVEN in the last 168 hours.  No results found for this or any previous visit (from the past 240 hours).       Radiology Studies: MR BRAIN WO CONTRAST Result Date: 06/24/2024 EXAM: MRI BRAIN WITHOUT CONTRAST 06/24/2024 08:00:15 AM TECHNIQUE: Multiplanar multisequence MRI of the head/brain was performed without the administration of intravenous contrast. COMPARISON: Head CT from 06/23/2024. Brain MRI from 01/20/2024. CLINICAL HISTORY: 89 year old female with acute neuro deficit, stroke suspected. FINDINGS: BRAIN AND VENTRICLES: No acute infarct. No intracranial hemorrhage. No mass. No midline shift. No hydrocephalus. Cerebral volume is stable, appears largely normal for age although there is evidence of disproportionate mesial temporal lobe atrophy on series 17 image 16. Mild for age nonspecific periventricular white matter T2 and FLAIR hyperintensity appears stable. No convincing cortical encephalomalacia or chronic cerebral blood products. The sella is unremarkable. Normal flow voids. ORBITS: No acute abnormality. SINUSES AND MASTOIDS: No acute abnormality. BONES AND SOFT TISSUES: Normal for age visible cervical spine and bone marrow signal. No acute soft tissue abnormality. IMPRESSION: 1. No  acute intracranial abnormality. 2. Chronic mesial temporal lobe atrophy suspected. Mild for age chronic white matter changes. Electronically signed by: Helayne Hurst MD 06/24/2024 08:11 AM EST RP Workstation: [id number]   DG Shoulder Right Result Date: 06/23/2024 CLINICAL DATA:  Fall and right shoulder pain. EXAM: RIGHT SHOULDER - 2+ VIEW COMPARISON:  None Available. FINDINGS: Faint lucencies through the bony glenoid, likely artifactual or related to osteopenia. A fracture is less likely. No acute fracture. No dislocation. The bones are osteopenic. No effusion. The soft tissues are unremarkable. IMPRESSION: Artifact versus less likely fracture of the bony glenoid. Electronically Signed   By: Vanetta Chou M.D.   On: 06/23/2024 19:46   CT Head Wo Contrast Result Date: 06/23/2024 EXAM: CT HEAD WITHOUT CONTRAST 06/23/2024 07:11:28 PM TECHNIQUE: CT of the head was performed without the administration of intravenous contrast. Automated exposure control, iterative reconstruction, and/or weight based adjustment of the mA/kV was utilized to reduce the radiation dose to as low as reasonably achievable. COMPARISON: CT head 01/20/2024 CLINICAL HISTORY: Head trauma, minor (Age >= 65y) FINDINGS: BRAIN AND VENTRICLES: No acute hemorrhage. No evidence of acute infarct. No hydrocephalus. No extra-axial collection. No mass  effect or midline shift. ORBITS: No acute abnormality. SINUSES: No acute abnormality. SOFT TISSUES AND SKULL: No acute soft tissue abnormality. No skull fracture. IMPRESSION: 1. No acute intracranial abnormality. Electronically signed by: Gilmore Molt MD 06/23/2024 07:20 PM EST RP Workstation: [id number]   DG Elbow 2 Views Right Result Date: 06/23/2024 EXAM: 1 or 2 VIEW(S) XRAY OF THE RIGHT ELBOW COMPARISON: None available. CLINICAL HISTORY: fall, injury FINDINGS: BONES AND JOINTS: No acute fracture. No focal osseous lesion. No malalignment. Possible small elbow effusion. SOFT TISSUES: The soft  tissues are unremarkable. IMPRESSION: 1. Possible small elbow effusion. Occult fracture not excluded, suggest short-term radiographic follow-up. Electronically signed by: Luke Bun MD 06/23/2024 06:19 PM EST RP Workstation: [id number]           LOS: 0 days   Time spent= 35 mins    Deliliah Room, MD [facility]  If 7PM-7AM, please contact night-coverage  06/24/2024, 10:12 AM

## 2024-06-24 NOTE — Evaluation (Signed)
 Physical Therapy Evaluation Patient Details Name: [name] Williams MRN: [id number] DOB: [birth year] Today's Date: 06/24/2024  History of Present Illness  [name] Williams is a 89 y.o. female with medical history significant of CLL, CKD 3B, dementia, prior TIA, atrial fibrillation not on anticoagulant who presents to the emergency department after sustaining a fall at a department store.  She states that she tripped on the curb and sustained a skin tear to the right elbow.  EMS was activated and after examining the patient, EMS wanted to take the patient to the ED, but she refused.  Son was present when EMS team arrived and he reassured them that patient was at her baseline and that he will take her to ED for further evaluation.  Patient lives alone at baseline and was able to take care of her ADLs   Clinical Impression  Patient demonstrates fair/good return for sitting up at bedside using LUE, (RUE in sling), slightly unsteady movemen during transfers without AD, safer holding onto RW with left hand, fair return for transferring to/from commode in bathroom with hand held assist and limited for ambulation due to increasing HR up to 140 BPM. Patient put back to bed after therapy. Patient will benefit from continued skilled physical therapy in hospital and recommended venue below to increase strength, balance, endurance for safe ADLs and gait.         If plan is discharge home, recommend the following: A little help with walking and/or transfers;A little help with bathing/dressing/bathroom;Help with stairs or ramp for entrance;Assist for transportation;Assistance with cooking/housework   Can travel by private vehicle   Yes    Programme Researcher, Broadcasting/film/video;Other (comment) (Quad cane)  Recommendations for Other Services       Functional Status Assessment Patient has had a recent decline in their functional status and demonstrates the ability to make significant improvements in function in a reasonable and  predictable amount of time.     Precautions / Restrictions Precautions Precautions: Fall Recall of Precautions/Restrictions: Impaired Restrictions Weight Bearing Restrictions Per Provider Order: Yes Other Position/Activity Restrictions: NWB RUE until clarfication with MD      Mobility  Bed Mobility Overal bed mobility: Needs Assistance Bed Mobility: Supine to Sit, Sit to Supine     Supine to sit: Contact guard Sit to supine: Contact guard assist   General bed mobility comments: slightly labored movement    Transfers Overall transfer level: Needs assistance Equipment used: Rolling walker (2 wheels) Transfers: Sit to/from Stand, Bed to chair/wheelchair/BSC Sit to Stand: Contact guard assist, Min assist   Step pivot transfers: Contact guard assist, Min assist       General transfer comment: able to take steps using LUE to hold onto RW, able to transfer without AD with labored movement    Ambulation/Gait Ambulation/Gait assistance: Contact guard assist, Min assist Gait Distance (Feet): 15 Feet Assistive device: Rolling walker (2 wheels) Gait Pattern/deviations: Decreased step length - right, Decreased step length - left, Decreased stride length Gait velocity: decreased     General Gait Details: slightly labored movement with fair/good return for holding onto RW using LUE, RUE in sling, limited for gait training mostly due to increasing HR above 140 BPM  Stairs            Wheelchair Mobility     Tilt Bed    Modified Rankin (Stroke Patients Only)       Balance Overall balance assessment: Needs assistance Sitting-balance support: Feet supported, No upper extremity supported Sitting balance-Leahy Scale:  Fair Sitting balance - Comments: fair/good seated at EOB   Standing balance support: During functional activity, Single extremity supported Standing balance-Leahy Scale: Fair Standing balance comment: using LUE on RW, or hand held assist without AD                              Pertinent Vitals/Pain Pain Assessment Pain Assessment: Faces Faces Pain Scale: Hurts a little bit Pain Location: RUE Pain Descriptors / Indicators: Discomfort, Sore Pain Intervention(s): Limited activity within patient's tolerance, Monitored during session    Home Living Family/patient expects to be discharged to:: Private residence Living Arrangements: Children Available Help at Discharge: Family;Available PRN/intermittently Type of Home: House Home Access: Stairs to enter Entrance Stairs-Rails: Can reach both Entrance Stairs-Number of Steps: 1 Alternate Level Stairs-Number of Steps: 14 Home Layout: Laundry or work area in basement;Two level Home Equipment: None Additional Comments: has cane but it is currently being used by her son. son lives in the basement and pt at times takes meals to him    Prior Function Prior Level of Function : Independent/Modified Independent;Driving             Mobility Comments: ind, no AD ADLs Comments: ind; cooks all the meals in her household     Extremity/Trunk Assessment   Upper Extremity Assessment Upper Extremity Assessment: Defer to OT evaluation    Lower Extremity Assessment Lower Extremity Assessment: Generalized weakness    Cervical / Trunk Assessment Cervical / Trunk Assessment: Normal  Communication   Communication Communication: Impaired Factors Affecting Communication: Hearing impaired    Cognition Arousal: Alert Behavior During Therapy: WFL for tasks assessed/performed, Impulsive   PT - Cognitive impairments: History of cognitive impairments                       PT - Cognition Comments: slightly impulsive Following commands: Impaired Following commands impaired: Follows one step commands with increased time     Cueing Cueing Techniques: Verbal cues, Tactile cues     General Comments      Exercises     Assessment/Plan    PT Assessment Patient needs  continued PT services  PT Problem List Decreased strength;Decreased activity tolerance;Decreased balance;Decreased mobility       PT Treatment Interventions DME instruction;Gait training;Stair training;Functional mobility training;Therapeutic activities;Therapeutic exercise;Balance training;Patient/family education    PT Goals (Current goals can be found in the Care Plan section)  Acute Rehab PT Goals Patient Stated Goal: return home with family to assist PT Goal Formulation: With patient Time For Goal Achievement: 07/08/24 Potential to Achieve Goals: Good    Frequency Min 3X/week     Co-evaluation PT/OT/SLP Co-Evaluation/Treatment: Yes Reason for Co-Treatment: To address functional/ADL transfers PT goals addressed during session: Balance;Proper use of DME         AM-PAC PT 6 Clicks Mobility  Outcome Measure Help needed turning from your back to your side while in a flat bed without using bedrails?: None Help needed moving from lying on your back to sitting on the side of a flat bed without using bedrails?: A Little Help needed moving to and from a bed to a chair (including a wheelchair)?: A Little Help needed standing up from a chair using your arms (e.g., wheelchair or bedside chair)?: A Little Help needed to walk in hospital room?: A Little Help needed climbing 3-5 steps with a railing? : A Lot 6 Click Score: 18    End  of Session   Activity Tolerance: Patient tolerated treatment well;Patient limited by fatigue Patient left: in bed;with call bell/phone within reach Nurse Communication: Mobility status PT Visit Diagnosis: Unsteadiness on feet (R26.81);Other abnormalities of gait and mobility (R26.89);Muscle weakness (generalized) (M62.81)    Time: 1020-1050 PT Time Calculation (min) (ACUTE ONLY): 30 min   Charges:   PT Evaluation $PT Eval Moderate Complexity: 1 Mod PT Treatments $Therapeutic Activity: 23-37 mins PT General Charges $$ ACUTE PT VISIT: 1 Visit          2:07 PM, 06/24/24 Lynwood Music, MPT Physical Therapist with [facility] 336 [phone] office [phone] mobile phone

## 2024-06-24 NOTE — Progress Notes (Signed)
 Mobility Specialist Progress Note:    06/24/24 1230  Mobility  Activity Ambulated with assistance  Level of Assistance Contact guard assist, steadying assist  Assistive Device None  Distance Ambulated (ft) 35 ft  Range of Motion/Exercises Active;All extremities  Activity Response Tolerated well  Mobility Referral Yes  Mobility visit 1 Mobility  Mobility Specialist Start Time (ACUTE ONLY) 1230  Mobility Specialist Stop Time (ACUTE ONLY) 1250  Mobility Specialist Time Calculation (min) (ACUTE ONLY) 20 min   Pt received in bed, agreeable to mobility. Required CGA to stand and ambulate with no AD. Tolerated well, asx throughout. Returned supine, all needs met.  Marcellas Marchant Mobility Specialist Please contact via Special Educational Needs Teacher or  Rehab office at [phone]

## 2024-06-24 NOTE — Evaluation (Signed)
 Occupational Therapy Evaluation Patient Details Name: TEKEYAH SANTIAGO MRN: [id number] DOB: 01/24/1928 Today's Date: 06/24/2024   History of Present Illness   RAEDEN BELZER is a 89 y.o. female with medical history significant of CLL, CKD 3B, dementia, prior TIA, atrial fibrillation not on anticoagulant who presents to the emergency department after sustaining a fall at a department store.  She states that she tripped on the curb and sustained a skin tear to the right elbow.  EMS was activated and after examining the patient, EMS wanted to take the patient to the ED, but she refused.  Son was present when EMS team arrived and he reassured them that patient was at her baseline and that he will take her to ED for further evaluation.  Patient lives alone at baseline and was able to take care of her ADLs     Clinical Impressions Pt admitted for concerns listed above. PTA pt reported that she was independent with all ADL's and iADL's, including driving. At this time, she presents with mild decreased balance, strength, and cognition, requiring contact guard to min assist with functional mobility and ADL's. Recommending further rehabilitation to improve independence and acute OT will continue to follow.      If plan is discharge home, recommend the following:   A little help with walking and/or transfers;A little help with bathing/dressing/bathroom;Assistance with cooking/housework;Direct supervision/assist for medications management;Supervision due to cognitive status     Functional Status Assessment   Patient has had a recent decline in their functional status and demonstrates the ability to make significant improvements in function in a reasonable and predictable amount of time.     Equipment Recommendations   None recommended by OT     Recommendations for Other Services         Precautions/Restrictions   Precautions Precautions: Fall Recall of Precautions/Restrictions:  Impaired Restrictions Weight Bearing Restrictions Per Provider Order: Yes RUE Weight Bearing Per Provider Order: Non weight bearing Other Position/Activity Restrictions: NWB RUE until clarfication with MD     Mobility Bed Mobility Overal bed mobility: Needs Assistance Bed Mobility: Supine to Sit, Sit to Supine     Supine to sit: Contact guard Sit to supine: Contact guard assist   General bed mobility comments: slightly labored movement    Transfers Overall transfer level: Needs assistance Equipment used: Rolling walker (2 wheels) Transfers: Sit to/from Stand, Bed to chair/wheelchair/BSC Sit to Stand: Contact guard assist, Min assist     Step pivot transfers: Contact guard assist, Min assist     General transfer comment: able to take steps using LUE to hold onto RW, able to transfer without AD with labored movement      Balance Overall balance assessment: Needs assistance Sitting-balance support: Feet supported, No upper extremity supported Sitting balance-Leahy Scale: Fair Sitting balance - Comments: fair/good seated at EOB   Standing balance support: During functional activity, Single extremity supported Standing balance-Leahy Scale: Fair Standing balance comment: using LUE on RW, or hand held assist without AD                           ADL either performed or assessed with clinical judgement   ADL Overall ADL's : Needs assistance/impaired                                       General ADL Comments: Pt is  supervision to CGA level, requiring verbal cuing due to cognition     Vision Baseline Vision/History: 0 No visual deficits Ability to See in Adequate Light: 0 Adequate Patient Visual Report: No change from baseline Vision Assessment?: No apparent visual deficits     Perception         Praxis         Pertinent Vitals/Pain Pain Assessment Pain Assessment: No/denies pain     Extremity/Trunk Assessment Upper Extremity  Assessment Upper Extremity Assessment: Overall WFL for tasks assessed   Lower Extremity Assessment Lower Extremity Assessment: Defer to PT evaluation   Cervical / Trunk Assessment Cervical / Trunk Assessment: Normal   Communication Communication Communication: Impaired Factors Affecting Communication: Hearing impaired   Cognition Arousal: Alert Behavior During Therapy: WFL for tasks assessed/performed, Impulsive Cognition: History of cognitive impairments                               Following commands: Impaired Following commands impaired: Follows one step commands with increased time     Cueing  General Comments   Cueing Techniques: Verbal cues;Tactile cues  VSS on RA   Exercises     Shoulder Instructions      Home Living Family/patient expects to be discharged to:: Private residence Living Arrangements: Children Available Help at Discharge: Family;Available PRN/intermittently Type of Home: House Home Access: Stairs to enter [employer] of Steps: 1 Entrance Stairs-Rails: Can reach both Home Layout: Laundry or work area in basement;Two level Alternate Level Stairs-Number of Steps: 14   Bathroom Shower/Tub: Producer, Television/film/video: Standard Bathroom Accessibility: Yes How Accessible: Accessible via walker Home Equipment: Shower seat;Cane - single point;Rolling Walker (2 wheels);Grab bars - toilet;Grab bars - tub/shower   Additional Comments: has cane but it is currently being used by her son. son lives in the basement and pt at times takes meals to him      Prior Functioning/Environment Prior Level of Function : Independent/Modified Independent;Driving             Mobility Comments: ind, no AD ADLs Comments: ind; cooks all the meals in her household    OT Problem List: Decreased activity tolerance;Impaired balance (sitting and/or standing);Decreased strength;Decreased safety awareness;Decreased cognition;Decreased  knowledge of use of DME or AE;Impaired UE functional use   OT Treatment/Interventions: Self-care/ADL training;Therapeutic exercise;Neuromuscular education;Energy conservation;DME and/or AE instruction;Manual therapy;Therapeutic activities;Patient/family education;Balance training      OT Goals(Current goals can be found in the care plan section)   Acute Rehab OT Goals Patient Stated Goal: To go home OT Goal Formulation: With patient Time For Goal Achievement: 07/08/24 Potential to Achieve Goals: Good ADL Goals Pt Will Perform Grooming: with modified independence;standing Pt Will Perform Lower Body Bathing: with modified independence;sit to/from stand;sitting/lateral leans Pt Will Perform Lower Body Dressing: with modified independence;sit to/from stand;sitting/lateral leans Pt Will Transfer to Toilet: with modified independence;ambulating Pt Will Perform Toileting - Clothing Manipulation and hygiene: with modified independence;sitting/lateral leans;sit to/from stand   OT Frequency:  Min 1X/week    Co-evaluation PT/OT/SLP Co-Evaluation/Treatment: Yes Reason for Co-Treatment: To address functional/ADL transfers PT goals addressed during session: Balance;Proper use of DME OT goals addressed during session: ADL's and self-care;Strengthening/ROM      AM-PAC OT 6 Clicks Daily Activity     Outcome Measure Help from another person eating meals?: A Little Help from another person taking care of personal grooming?: A Little Help from another person toileting, which includes using  toliet, bedpan, or urinal?: A Little Help from another person bathing (including washing, rinsing, drying)?: A Little Help from another person to put on and taking off regular upper body clothing?: A Little Help from another person to put on and taking off regular lower body clothing?: A Little 6 Click Score: 18   End of Session Equipment Utilized During Treatment: Gait belt Nurse Communication: Mobility  status  Activity Tolerance: Patient tolerated treatment well Patient left: in bed;with call bell/phone within reach;with bed alarm set  OT Visit Diagnosis: Unsteadiness on feet (R26.81);Other abnormalities of gait and mobility (R26.89);Muscle weakness (generalized) (M62.81)                Time: 8967-8947 OT Time Calculation (min): 20 min Charges:  OT General Charges $OT Visit: 1 Visit OT Evaluation $OT Eval Moderate Complexity: 1 Mod  Raelin Pixler, OTR/L [facility]  Phu Record Jillyn Nightingale 06/24/2024, 4:39 PM

## 2024-06-24 NOTE — NC FL2 (Signed)
 [place]  MEDICAID [id number] LEVEL OF CARE FORM     IDENTIFICATION  Patient Name: [name] Williams Birthdate: 1928/07/01 Sex: female Admission Date (Current Location): 06/23/2024  [facility] and [place] Number:  Producer, Television/film/video and Address:  [facility],  [address], [place] [address]      Provider Number: [id number]  Attending Physician Name and Address:  Dino Antu, MD  Relative Name and Phone Number:  LYN HOWARD (Daughter)  [phone]    Current Level of Care: Hospital Recommended Level of Care: Skilled Nursing Facility Prior Approval Number:    Date Approved/Denied:   PASRR Number: [id number]  Discharge Plan: SNF    Current Diagnoses: Patient Active Problem List   Diagnosis Date Noted   Fall 06/23/2024   Mild protein-calorie malnutrition 08/06/2023   CLL (chronic lymphocytic leukemia) (HCC) 04/07/2019   Leukocytosis 03/12/2019   Insomnia 01/07/2015   Osteopenia 01/07/2015   GAD (generalized anxiety disorder) 11/24/2014   Dermatitis, purulent 04/08/2014   Memory loss 01/28/2014   Fatigue 01/07/2014   BRADYCARDIA 03/15/2010   SYNCOPE AND COLLAPSE 03/15/2010   Depression 09/14/2009   Transient cerebral ischemia 09/14/2009   GERD 09/14/2009   Hyperlipidemia 07/14/2008   Hypertrophic obstructive cardiomyopathy (HCC) 07/14/2008   Hypertrophic obstructive cardiomyopathy (HCC) 07/14/2008    Orientation RESPIRATION BLADDER Height & Weight     Self, Place  Normal Incontinent Weight: 122 lb (55.3 kg) Height:  5' 3 (160 cm)  BEHAVIORAL SYMPTOMS/MOOD NEUROLOGICAL BOWEL NUTRITION STATUS      Continent Diet (Regular)  AMBULATORY STATUS COMMUNICATION OF NEEDS Skin   Limited Assist Verbally Normal                       Personal Care Assistance Level of Assistance  Bathing, Feeding, Dressing Bathing Assistance: Limited assistance Feeding assistance: Independent Dressing Assistance: Limited assistance     Functional Limitations Info   Sight, Hearing, Speech Sight Info: Impaired Hearing Info: Adequate Speech Info: Adequate    SPECIAL CARE FACTORS FREQUENCY  OT (By licensed OT), PT (By licensed PT)     PT Frequency: 5x/wk OT Frequency: 5x/wk            Contractures Contractures Info: Not present    Additional Factors Info  Code Status, Allergies Code Status Info: FULL Allergies Info: Latex           Current Medications (06/24/2024):  This is the current hospital active medication list Current Facility-Administered Medications  Medication Dose Route Frequency Provider Last Rate Last Admin   acetaminophen  (TYLENOL ) tablet 650 mg  650 mg Oral Q6H PRN Adefeso, Oladapo, DO       Or   acetaminophen  (TYLENOL ) suppository 650 mg  650 mg Rectal Q6H PRN Adefeso, Oladapo, DO       enoxaparin (LOVENOX) injection 30 mg  30 mg Subcutaneous Q24H Adefeso, Oladapo, DO   30 mg at 06/24/24 0708   feeding supplement (ENSURE PLUS HIGH PROTEIN) liquid 237 mL  237 mL Oral BID BM Rashid, Farhan, MD       LORazepam (ATIVAN) injection 0.5 mg  0.5 mg Intravenous Once PRN Adefeso, Oladapo, DO       ondansetron (ZOFRAN) tablet 4 mg  4 mg Oral Q6H PRN Adefeso, Oladapo, DO       Or   ondansetron (ZOFRAN) injection 4 mg  4 mg Intravenous Q6H PRN Adefeso, Oladapo, DO         Discharge Medications: Please see discharge summary for a list of  discharge medications.  Relevant Imaging Results:  Relevant Lab Results:   Additional Information SSN: [id number]  Hoy DELENA Bigness, LCSW

## 2024-06-24 NOTE — ED Notes (Signed)
 Pt back from MRI

## 2024-06-24 NOTE — TOC Initial Note (Addendum)
 Transition of Care [facility]) - Initial/Assessment Note    Patient Details  Name: [name] Williams MRN: [id number] Date of Birth: [birth year]  Transition of Care [facility]) CM/SW Contact:    Hoy DELENA Bigness, LCSW Phone Number: 06/24/2024, 11:56 AM  Clinical Narrative:                 Pt admitted due to fall. Pt from home alone. Pt oriented x 2. Spoke with pt's son, Dasie, via t/c to discuss recommendation for SNF. Pt's son agreeable to plan for STR. Pt has not been to SNF in the past. Son deferred to pt's daughter for placement preference. CSW left VM with, pt's daughter, Apolinar.  Referrals for SNF have been faxed out and currently awaiting bed offers. Will review with pt's daughter once received.   ADDENDUM: Received return call from pt's daughter who shares that she prefers placement at facility in [place] or as close to [place] as possible. CSW awaiting bed offers prior to reviewing with daughter.   Expected Discharge Plan: Skilled Nursing Facility Barriers to Discharge: Continued Medical Work up, SNF Pending bed offer   Patient Goals and CMS Choice Patient states their goals for this hospitalization and ongoing recovery are:: For pt to go to [facility] CMS Medicare.gov Compare Post Acute Care list provided to:: Patient Represenative (must comment) Choice offered to / list presented to : Adult Children [place] ownership interest in [facility].provided to:: Adult Children    Expected Discharge Plan and Services In-house Referral: Clinical Social Work Discharge Planning Services: NA Post Acute Care Choice: Skilled Nursing Facility Living arrangements for the past 2 months: Single Family Home                 DME Arranged: N/A DME Agency: NA                  Prior Living Arrangements/Services Living arrangements for the past 2 months: Single Family Home Lives with:: Self Patient language and need for interpreter reviewed:: Yes Do you feel safe going back to the place where you  live?: Yes      Need for Family Participation in Patient Care: Yes (Comment) Care giver support system in place?: No (comment) Current home services: DME Criminal Activity/Legal Involvement Pertinent to Current Situation/Hospitalization: No - Comment as needed  Activities of Daily Living   ADL Screening (condition at time of admission) Independently performs ADLs?: Yes (appropriate for developmental age) Is the patient deaf or have difficulty hearing?: No Does the patient have difficulty seeing, even when wearing glasses/contacts?: No Does the patient have difficulty concentrating, remembering, or making decisions?: No  Permission Sought/Granted Permission sought to share information with : Family Supports, Photographer granted to share info w AGENCY: SNF's        Emotional Assessment   Attitude/Demeanor/Rapport: Unable to Assess Affect (typically observed): Unable to Assess Orientation: : Oriented to Self, Oriented to Place Alcohol / Substance Use: Not Applicable Psych Involvement: No (comment)  Admission diagnosis:  Fall [W19.XXXA] Fall, initial encounter [id number]] Patient Active Problem List   Diagnosis Date Noted   Fall 06/23/2024   Mild protein-calorie malnutrition 08/06/2023   CLL (chronic lymphocytic leukemia) (HCC) 04/07/2019   Leukocytosis 03/12/2019   Insomnia 01/07/2015   Osteopenia 01/07/2015   GAD (generalized anxiety disorder) 11/24/2014   Dermatitis, purulent 04/08/2014   Memory loss 01/28/2014   Fatigue 01/07/2014   BRADYCARDIA 03/15/2010   SYNCOPE AND COLLAPSE 03/15/2010  Depression 09/14/2009   Transient cerebral ischemia 09/14/2009   GERD 09/14/2009   Hyperlipidemia 07/14/2008   Hypertrophic obstructive cardiomyopathy (HCC) 07/14/2008   Hypertrophic obstructive cardiomyopathy (HCC) 07/14/2008   PCP:  Lavell Bari LABOR, FNP Pharmacy:   [facility] [phone] [facility] [place] - [address] [place] [address] Phone: [phone] Fax: [phone]  [facility]) - [place], [place] - [address] [facility] [address] [place] [address] [place] [address] Phone: [phone] Fax: [phone]  [facility] [address] [place] [address] Phone: [phone] Fax: [phone]  [facility] [address] [place] [address] Phone: [phone] Fax: [phone]     Social Drivers of Health (SDOH) Social History: SDOH Screenings   Food Insecurity: No Food Insecurity (06/24/2024)  Housing: Low Risk  (06/24/2024)  Transportation Needs: No Transportation Needs (06/24/2024)  Utilities: Not At Risk (06/24/2024)  Depression (PHQ2-9): High Risk (05/08/2024)  Financial Resource Strain: Low Risk  (05/08/2024)  Physical Activity: Inactive (05/08/2024)  Social Connections: Moderately Isolated (06/24/2024)  Stress: Stress Concern Present (05/08/2024)  Tobacco Use: High Risk (06/23/2024)  Health Literacy: Low Risk  (09/13/2020)   Received from [facility]   SDOH Interventions: Social Connections Interventions: Inpatient TOC, Intervention Not Indicated   Readmission Risk Interventions     No data to display

## 2024-06-24 NOTE — Progress Notes (Signed)
 I was alerted by CCMD that patient had 5 runs of Vtach with no symptoms attached. Orion Room MD. has been made aware and no new orders at this time.

## 2024-06-24 NOTE — Plan of Care (Signed)
  Problem: Acute Rehab PT Goals(only PT should resolve) Goal: Pt Will Go Supine/Side To Sit Outcome: Progressing Flowsheets (Taken 06/24/2024 1408) Pt will go Supine/Side to Sit: with modified independence Goal: Patient Will Transfer Sit To/From Stand Outcome: Progressing Flowsheets (Taken 06/24/2024 1408) Patient will transfer sit to/from stand:  with modified independence  with supervision Goal: Pt Will Transfer Bed To Chair/Chair To Bed Outcome: Progressing Flowsheets (Taken 06/24/2024 1408) Pt will Transfer Bed to Chair/Chair to Bed:  with modified independence  with supervision Goal: Pt Will Ambulate Outcome: Progressing Flowsheets (Taken 06/24/2024 1408) Pt will Ambulate:  75 feet  with contact guard assist  with supervision  with cane Note: Quad-cane   2:09 PM, 06/24/24 Lynwood Music, MPT Physical Therapist with [facility] 336 [phone] office [phone] mobile phone

## 2024-06-25 DIAGNOSIS — S51011A Laceration without foreign body of right elbow, initial encounter: Secondary | ICD-10-CM | POA: Diagnosis not present

## 2024-06-25 DIAGNOSIS — W19XXXA Unspecified fall, initial encounter: Secondary | ICD-10-CM | POA: Diagnosis not present

## 2024-06-25 DIAGNOSIS — I482 Chronic atrial fibrillation, unspecified: Secondary | ICD-10-CM | POA: Diagnosis not present

## 2024-06-25 LAB — CBC
HCT: 39 % (ref 36.0–46.0)
Hemoglobin: 12.6 g/dL (ref 12.0–15.0)
MCH: 30.3 pg (ref 26.0–34.0)
MCHC: 32.3 g/dL (ref 30.0–36.0)
MCV: 93.8 fL (ref 80.0–100.0)
Platelets: 211 K/uL (ref 150–400)
RBC: 4.16 MIL/uL (ref 3.87–5.11)
RDW: 13.1 % (ref 11.5–15.5)
WBC: 21.9 K/uL — ABNORMAL HIGH (ref 4.0–10.5)
nRBC: 0 % (ref 0.0–0.2)

## 2024-06-25 LAB — COMPREHENSIVE METABOLIC PANEL WITH GFR
ALT: <5 U/L (ref 0–44)
AST: 21 U/L (ref 15–41)
Albumin: 3 g/dL — ABNORMAL LOW (ref 3.5–5.0)
Alkaline Phosphatase: 72 U/L (ref 38–126)
Anion gap: 10 (ref 5–15)
BUN: 13 mg/dL (ref 8–23)
CO2: 25 mmol/L (ref 22–32)
Calcium: 8.4 mg/dL — ABNORMAL LOW (ref 8.9–10.3)
Chloride: 106 mmol/L (ref 98–111)
Creatinine, Ser: 1.16 mg/dL — ABNORMAL HIGH (ref 0.44–1.00)
GFR, Estimated: 43 mL/min — ABNORMAL LOW (ref >60)
Glucose, Bld: 90 mg/dL (ref 70–99)
Potassium: 3.4 mmol/L — ABNORMAL LOW (ref 3.5–5.1)
Sodium: 141 mmol/L (ref 135–145)
Total Bilirubin: 1.7 mg/dL — ABNORMAL HIGH (ref 0.0–1.2)
Total Protein: 5.1 g/dL — ABNORMAL LOW (ref 6.5–8.1)

## 2024-06-25 LAB — PHOSPHORUS: Phosphorus: 2.8 mg/dL (ref 2.5–4.6)

## 2024-06-25 LAB — MAGNESIUM: Magnesium: 2.2 mg/dL (ref 1.7–2.4)

## 2024-06-25 MED ORDER — POTASSIUM CHLORIDE 20 MEQ PO PACK
40.0000 meq | PACK | Freq: Once | ORAL | Status: AC
  Administered 2024-06-25: 40 meq via ORAL
  Filled 2024-06-25: qty 2

## 2024-06-25 MED ORDER — METOPROLOL TARTRATE 25 MG PO TABS
12.5000 mg | ORAL_TABLET | Freq: Two times a day (BID) | ORAL | Status: DC
  Administered 2024-06-25 – 2024-06-26 (×3): 12.5 mg via ORAL
  Filled 2024-06-25 (×3): qty 1

## 2024-06-25 NOTE — Plan of Care (Signed)

## 2024-06-25 NOTE — Care Management Obs Status (Signed)
 MEDICARE OBSERVATION STATUS NOTIFICATION   Patient Details  Name: [name] Williams MRN: [id number] Date of Birth: Apr 14, 1928   Medicare Observation Status Notification Given:  Yes    Gerhard Rappaport LITTIE Ada 06/25/2024, 11:05 AM

## 2024-06-25 NOTE — Progress Notes (Signed)
 Updated pt daughter. Daughter will be here in the morning.

## 2024-06-25 NOTE — TOC Progression Note (Signed)
 Transition of Care [facility]) - Progression Note    Patient Details  Name: [name] Williams MRN: [id number] Date of Birth: [birth year]  Transition of Care [facility]) CM/SW Contact  Hoy DELENA Bigness, LCSW Phone Number: 06/25/2024, 8:52 AM  Clinical Narrative:    Spoke with pt's daughter to review bed offers for SNF. Daughter has accepted bed offer at [facility]. Auth currently pending.   [facility] [address] [place], [place] [address] [phone] Overall rating ?  [facility] [address] [place], [place] [address] [phone] Overall rating ?????  [facility] [address] [place], [place] [address] [phone] Overall rating ??   Expected Discharge Plan: Skilled Nursing Facility Barriers to Discharge: Continued Medical Work up, SNF Pending bed offer               Expected Discharge Plan and Services In-house Referral: Clinical Social Work Discharge Planning Services: NA Post Acute Care Choice: Skilled Nursing Facility Living arrangements for the past 2 months: Single Family Home                 DME Arranged: N/A DME Agency: NA                   Social Drivers of Health (SDOH) Interventions SDOH Screenings   Food Insecurity: No Food Insecurity (06/24/2024)  Housing: Low Risk  (06/24/2024)  Transportation Needs: No Transportation Needs (06/24/2024)  Utilities: Not At Risk (06/24/2024)  Depression (PHQ2-9): High Risk (05/08/2024)  Financial Resource Strain: Low Risk  (05/08/2024)  Physical Activity: Inactive (05/08/2024)  Social Connections: Moderately Isolated (06/24/2024)  Stress: Stress Concern Present (05/08/2024)  Tobacco Use: High Risk (06/23/2024)  Health Literacy: Low Risk  (09/13/2020)   Received from [facility]    Readmission Risk Interventions     No data to display

## 2024-06-25 NOTE — Progress Notes (Signed)
 Mobility Specialist Progress Note:    06/25/24 0840  Mobility  Activity Ambulated with assistance  Level of Assistance Contact guard assist, steadying assist  Assistive Device None  Distance Ambulated (ft) 35 ft  Range of Motion/Exercises Active;All extremities  RUE Weight Bearing Per Provider Order WBAT  Activity Response Tolerated well  Mobility Referral Yes  Mobility visit 1 Mobility  Mobility Specialist Start Time (ACUTE ONLY) 0840  Mobility Specialist Stop Time (ACUTE ONLY) 0900  Mobility Specialist Time Calculation (min) (ACUTE ONLY) 20 min   Pt received in bed, agreeable to mobility. Required CGA to stand and ambulate with no AD. Tolerated well, c/o balance getting worse over the past few weeks. Returned supine, all needs met.  Khylan Sawyer Mobility Specialist Please contact via Special Educational Needs Teacher or  Rehab office at [phone]

## 2024-06-25 NOTE — Progress Notes (Addendum)
 PROGRESS NOTE    [name] Williams  FMW:[id number] DOB: May 23, 1928 DOA: 06/23/2024 PCP: Lavell Bari LABOR, FNP   Brief Narrative:   89 y.o. female with medical history significant of CLL, CKD 3B, dementia, prior TIA, atrial fibrillation not on anticoagulant who presents to the emergency department after sustaining a fall at a [employer] store.  She states that she tripped on the curb and sustained a skin tear to the right elbow. Right elbow x-ray showed possible small elbow effusion.  Occult fracture not included CT head without contrast showed no acute intracranial abnormality A long-arm splint was placed and patient was asked to follow-up with orthopedics as an outpatient. PT/OT evaluations done. Pending SNF placement.   Assessment & Plan:  Principal Problem:   Fall   Mechanical fall,POA:  No fractures  Physical deconditioning,POA: -F/u PT/OT evaluations  Ruled out acute ischemic CVA: MRI brain and CT head are unremarkable Concern for possibe TIA  Right elbow laceration: Sling in place. Outpatient orthopedics follow up.  CLL: No acute issues  CKD 3B,POA: No acute issues  Hypokalemia: prn repletion  NSVT: Asymptomatic. Continue tele. Started on oral metoprolol 12.5 mg PO BID on 11/6.  Disposition: She came from home. Pending placement to [facility] rehab, awaiting insurance auth.       DVT prophylaxis: enoxaparin (LOVENOX) injection 30 mg Start: 06/24/24 0700 SCDs Start: 06/24/24 9370     Code Status: Full Code Family Communication:  None at the bedside Status is: Observation The patient remains OBS appropriate and will d/c before 2 midnights.    Subjective:  Denied any active complaints.She said that her toe nail needs to be trimmed. She usually goes to [place] for that.   Examination:  General exam: Appears calm and comfortable  Respiratory system: Clear to auscultation. Respiratory effort normal. Cardiovascular system: S1 & S2 heard, RRR. No JVD,  murmurs, rubs, gallops or clicks. No pedal edema. Gastrointestinal system: Abdomen is nondistended, soft and nontender. No organomegaly or masses felt. Normal bowel sounds heard. Central nervous system: Alert and oriented. No focal neurological deficits. Extremities: Right elbow in sling Skin: No rashes, lesions or ulcers     Diet Orders (From admission, onward)     Start     Ordered   06/24/24 0750  Diet regular Room service appropriate? Yes; Fluid consistency: Thin  Diet effective now       Question Answer Comment  Room service appropriate? Yes   Fluid consistency: Thin      06/24/24 0749            Objective: Vitals:   06/24/24 1210 06/24/24 1554 06/24/24 1959 06/25/24 0601  BP: (!) 119/58 117/70 129/84 (!) 124/59  Pulse: 86 83 81 70  Resp: 20 19 20 19   Temp: 97.6 F (36.4 C) 98.2 F (36.8 C) 98.2 F (36.8 C) 98 F (36.7 C)  TempSrc: Oral Oral Oral Oral  SpO2: 96% 95% 95% 95%  Weight:      Height:       No intake or output data in the 24 hours ending 06/25/24 1021 Filed Weights   06/23/24 1400  Weight: 55.3 kg    Scheduled Meds:  enoxaparin (LOVENOX) injection  30 mg Subcutaneous Q24H   feeding supplement  237 mL Oral BID BM   Continuous Infusions:  Nutritional status     Body mass index is 21.61 kg/m.  Data Reviewed:   CBC: Recent Labs  Lab 06/23/24 1916 06/25/24 0327  WBC 22.0* 21.9*  NEUTROABS 10.6*  --  HGB 15.0 12.6  HCT 46.4* 39.0  MCV 94.3 93.8  PLT 241 211   Basic Metabolic Panel: Recent Labs  Lab 06/23/24 1916 06/25/24 0327  NA 139 141  K 4.3 3.4*  CL 103 106  CO2 24 25  GLUCOSE 105* 90  BUN 12 13  CREATININE 1.32* 1.16*  CALCIUM 9.1 8.4*  MG  --  2.2  PHOS  --  2.8   GFR: Estimated Creatinine Clearance: 24.5 mL/min (A) (by C-G formula based on SCr of 1.16 mg/dL (H)). Liver Function Tests: Recent Labs  Lab 06/23/24 1916 06/25/24 0327  AST 25 21  ALT <5 <5  ALKPHOS 82 72  BILITOT 1.6* 1.7*  PROT 6.2* 5.1*   ALBUMIN 3.6 3.0*   No results for input(s): LIPASE, AMYLASE in the last 168 hours. No results for input(s): AMMONIA in the last 168 hours. Coagulation Profile: No results for input(s): INR, PROTIME in the last 168 hours. Cardiac Enzymes: No results for input(s): CKTOTAL, CKMB, CKMBINDEX, TROPONINI in the last 168 hours. BNP (last 3 results) No results for input(s): PROBNP in the last 8760 hours. HbA1C: No results for input(s): HGBA1C in the last 72 hours. CBG: No results for input(s): GLUCAP in the last 168 hours. Lipid Profile: No results for input(s): CHOL, HDL, LDLCALC, TRIG, CHOLHDL, LDLDIRECT in the last 72 hours. Thyroid  Function Tests: No results for input(s): TSH, T4TOTAL, FREET4, T3FREE, THYROIDAB in the last 72 hours. Anemia Panel: No results for input(s): VITAMINB12, FOLATE, FERRITIN, TIBC, IRON, RETICCTPCT in the last 72 hours. Sepsis Labs: No results for input(s): PROCALCITON, LATICACIDVEN in the last 168 hours.  No results found for this or any previous visit (from the past 240 hours).       Radiology Studies: MR BRAIN WO CONTRAST Result Date: 06/24/2024 EXAM: MRI BRAIN WITHOUT CONTRAST 06/24/2024 08:00:15 AM TECHNIQUE: Multiplanar multisequence MRI of the head/brain was performed without the administration of intravenous contrast. COMPARISON: Head CT from 06/23/2024. Brain MRI from 01/20/2024. CLINICAL HISTORY: 89 year old female with acute neuro deficit, stroke suspected. FINDINGS: BRAIN AND VENTRICLES: No acute infarct. No intracranial hemorrhage. No mass. No midline shift. No hydrocephalus. Cerebral volume is stable, appears largely normal for age although there is evidence of disproportionate mesial temporal lobe atrophy on series 17 image 16. Mild for age nonspecific periventricular white matter T2 and FLAIR hyperintensity appears stable. No convincing cortical encephalomalacia or chronic cerebral blood  products. The sella is unremarkable. Normal flow voids. ORBITS: No acute abnormality. SINUSES AND MASTOIDS: No acute abnormality. BONES AND SOFT TISSUES: Normal for age visible cervical spine and bone marrow signal. No acute soft tissue abnormality. IMPRESSION: 1. No acute intracranial abnormality. 2. Chronic mesial temporal lobe atrophy suspected. Mild for age chronic white matter changes. Electronically signed by: Helayne Hurst MD 06/24/2024 08:11 AM EST RP Workstation: [id number]   DG Shoulder Right Result Date: 06/23/2024 CLINICAL DATA:  Fall and right shoulder pain. EXAM: RIGHT SHOULDER - 2+ VIEW COMPARISON:  None Available. FINDINGS: Faint lucencies through the bony glenoid, likely artifactual or related to osteopenia. A fracture is less likely. No acute fracture. No dislocation. The bones are osteopenic. No effusion. The soft tissues are unremarkable. IMPRESSION: Artifact versus less likely fracture of the bony glenoid. Electronically Signed   By: Vanetta Chou M.D.   On: 06/23/2024 19:46   CT Head Wo Contrast Result Date: 06/23/2024 EXAM: CT HEAD WITHOUT CONTRAST 06/23/2024 07:11:28 PM TECHNIQUE: CT of the head was performed without the administration of intravenous contrast. Automated exposure control,  iterative reconstruction, and/or weight based adjustment of the mA/kV was utilized to reduce the radiation dose to as low as reasonably achievable. COMPARISON: CT head 01/20/2024 CLINICAL HISTORY: Head trauma, minor (Age >= 65y) FINDINGS: BRAIN AND VENTRICLES: No acute hemorrhage. No evidence of acute infarct. No hydrocephalus. No extra-axial collection. No mass effect or midline shift. ORBITS: No acute abnormality. SINUSES: No acute abnormality. SOFT TISSUES AND SKULL: No acute soft tissue abnormality. No skull fracture. IMPRESSION: 1. No acute intracranial abnormality. Electronically signed by: Gilmore Molt MD 06/23/2024 07:20 PM EST RP Workstation: [id number]   DG Elbow 2 Views Right Result  Date: 06/23/2024 EXAM: 1 or 2 VIEW(S) XRAY OF THE RIGHT ELBOW COMPARISON: None available. CLINICAL HISTORY: fall, injury FINDINGS: BONES AND JOINTS: No acute fracture. No focal osseous lesion. No malalignment. Possible small elbow effusion. SOFT TISSUES: The soft tissues are unremarkable. IMPRESSION: 1. Possible small elbow effusion. Occult fracture not excluded, suggest short-term radiographic follow-up. Electronically signed by: Luke Bun MD 06/23/2024 06:19 PM EST RP Workstation: [id number]           LOS: 0 days   Time spent= 35 mins    Deliliah Room, MD [facility]  If 7PM-7AM, please contact night-coverage  06/25/2024, 10:21 AM

## 2024-06-25 NOTE — Progress Notes (Signed)
 CCMD called patient had 2-3 beats of v-tach as well as a PVC run that followed. Got in contact with DR. Rashid. Telemetry order was renewed as well as an order for oral metoprolol

## 2024-06-25 NOTE — Progress Notes (Signed)
 CCMD called  with 10 beats of vtach Ms. Desha is asymptomatic and at a resting point lying in bed.

## 2024-06-25 NOTE — Plan of Care (Signed)
   Problem: Activity: Goal: Risk for activity intolerance will decrease Outcome: Progressing   Problem: Coping: Goal: Level of anxiety will decrease Outcome: Progressing

## 2024-06-26 DIAGNOSIS — S51011A Laceration without foreign body of right elbow, initial encounter: Secondary | ICD-10-CM | POA: Diagnosis not present

## 2024-06-26 DIAGNOSIS — W19XXXA Unspecified fall, initial encounter: Secondary | ICD-10-CM | POA: Diagnosis not present

## 2024-06-26 MED ORDER — METOPROLOL TARTRATE 25 MG PO TABS: 12.5000 mg | ORAL_TABLET | Freq: Two times a day (BID) | ORAL | Status: AC

## 2024-06-26 MED ORDER — ENSURE PLUS HIGH PROTEIN PO LIQD: 237.0000 mL | Freq: Two times a day (BID) | ORAL | Status: AC

## 2024-06-26 MED ORDER — IBUPROFEN 800 MG PO TABS: 800.0000 mg | ORAL_TABLET | Freq: Three times a day (TID) | ORAL | Status: AC | PRN

## 2024-06-26 NOTE — TOC Transition Note (Signed)
 Transition of Care [facility]) - Discharge Note   Patient Details  Name: [name] Williams MRN: [id number] Date of Birth: 1928/03/05  Transition of Care [facility]) CM/SW Contact:  Hoy DELENA Bigness, LCSW Phone Number: 06/26/2024, 10:23 AM   Clinical Narrative:    Pt's insurance auth approved for STR. Pt to discharge to [facility] room 307. RN to call report to [phone]. CSW spoke with pt's daughter and confirmed discharge plans. [facility] called at 10:25am for transportation.   Final next level of care: Skilled Nursing Facility Barriers to Discharge: Barriers Resolved   Patient Goals and CMS Choice Patient states their goals for this hospitalization and ongoing recovery are:: For pt to go to [facility] CMS Medicare.gov Compare Post Acute Care list provided to:: Patient Represenative (must comment) Choice offered to / list presented to : Adult Children [facility] ownership interest in [facility].provided to:: Adult Children    Discharge Placement PASRR number recieved: 06/24/24            Patient chooses bed at: [facility] Patient to be transferred to facility by: [facility] Name of family member notified: Daughter, Apolinar Patient and family notified of of transfer: 06/26/24  Discharge Plan and Services Additional resources added to the After Visit Summary for   In-house Referral: Clinical Social Work Discharge Planning Services: NA Post Acute Care Choice: Skilled Nursing Facility          DME Arranged: N/A DME Agency: NA                  Social Drivers of Health (SDOH) Interventions SDOH Screenings   Food Insecurity: No Food Insecurity (06/24/2024)  Housing: Low Risk  (06/24/2024)  Transportation Needs: No Transportation Needs (06/24/2024)  Utilities: Not At Risk (06/24/2024)  Depression (PHQ2-9): High Risk (05/08/2024)  Financial Resource Strain: Low Risk  (05/08/2024)  Physical Activity: Inactive (05/08/2024)  Social Connections: Moderately Isolated (06/24/2024)  Stress:  Stress Concern Present (05/08/2024)  Tobacco Use: High Risk (06/23/2024)  Health Literacy: Low Risk  (09/13/2020)   Received from [facility]     Readmission Risk Interventions     No data to display

## 2024-06-26 NOTE — Discharge Summary (Signed)
 Physician Discharge Summary   Patient: [name] Williams MRN: [id number] DOB: 09/25/1927  Admit date:     06/23/2024  Discharge date: 06/26/24  Discharge Physician: Deliliah Room   PCP: Lavell Bari LABOR, FNP   Recommendations at discharge:    F/u with your PCP in one week. Call to make an appointment with orthopedics. Continue taking meds as prescribed.  Discharge Diagnoses: Principal Problem:   [facility] Course:  89 y.o. female with medical history significant of CLL, CKD 3B, dementia, prior TIA, atrial fibrillation not on anticoagulant who presents to the emergency department after sustaining a fall at a department store.  She states that she tripped on the curb and sustained a skin tear to the right elbow. Right elbow x-ray showed possible small elbow effusion.  Occult fracture not included CT head without contrast showed no acute intracranial abnormality A long-arm splint was placed and patient was asked to follow-up with orthopedics as an outpatient. PT/OT evaluations done. Started on oral metoprolol 12.5 mg p.o. twice daily because of frequent episodes of nonsustained VT. She will follow-up outpatient with PCP in 1 week.  She was advised to call the orthopedics office to make an appointment.  Her right elbow sling will be in place until she sees orthopedic surgeon or her PCP.   She was discharged to [facility] to her daughter at the bedside on the day of the discharge.      Consultants: None Procedures performed: None  Disposition: Skilled nursing facility Diet recommendation:  Cardiac diet DISCHARGE MEDICATION: Allergies as of 06/26/2024       Reactions   Latex Other (See Comments)   Per pt 11-10-14 she do not remember being allergic to this        Medication List     TAKE these medications    Aspirin  Low Dose 81 MG tablet Generic drug: aspirin  EC Take 81 mg by mouth daily.   feeding supplement Liqd Take 237 mLs by mouth 2 (two) times daily  between meals.   ibuprofen 800 MG tablet Commonly known as: ADVIL Take 1 tablet (800 mg total) by mouth every 8 (eight) hours as needed for moderate pain (pain score 4-6).   metoprolol tartrate 25 MG tablet Commonly known as: LOPRESSOR Take 0.5 tablets (12.5 mg total) by mouth 2 (two) times daily.   multivitamin tablet Take 1 tablet by mouth daily.        Contact information for follow-up providers     Lavell Bari LABOR, FNP. Schedule an appointment as soon as possible for a visit in 1 week(s).   Specialty: Family Medicine Contact information: [address] [place] [address] [phone]              Contact information for after-discharge care     Destination     [facility] .   Service: Skilled Nursing Contact information: [address] [place]  [address] [phone]                    Discharge Exam: Fredricka Weights   06/23/24 1400  Weight: 55.3 kg   General exam: Appears calm and comfortable  Respiratory system: Clear to auscultation. Respiratory effort normal. Cardiovascular system: S1 & S2 heard, RRR. No JVD, murmurs, rubs, gallops or clicks. No pedal edema. Gastrointestinal system: Abdomen is nondistended, soft and nontender. No organomegaly or masses felt. Normal bowel sounds heard. Central nervous system: Alert and oriented. No focal neurological deficits. Extremities:  Right elbow in sling Skin: No rashes, lesions or ulcers  Condition at discharge: good  The results of significant diagnostics from this hospitalization (including imaging, microbiology, ancillary and laboratory) are listed below for reference.   Imaging Studies: MR BRAIN WO CONTRAST Result Date: 06/24/2024 EXAM: MRI BRAIN WITHOUT CONTRAST 06/24/2024 08:00:15 AM TECHNIQUE: Multiplanar multisequence MRI of the head/brain was performed without the administration of intravenous contrast. COMPARISON: Head CT from 06/23/2024. Brain MRI from 01/20/2024.  CLINICAL HISTORY: 89 year old female with acute neuro deficit, stroke suspected. FINDINGS: BRAIN AND VENTRICLES: No acute infarct. No intracranial hemorrhage. No mass. No midline shift. No hydrocephalus. Cerebral volume is stable, appears largely normal for age although there is evidence of disproportionate mesial temporal lobe atrophy on series 17 image 16. Mild for age nonspecific periventricular white matter T2 and FLAIR hyperintensity appears stable. No convincing cortical encephalomalacia or chronic cerebral blood products. The sella is unremarkable. Normal flow voids. ORBITS: No acute abnormality. SINUSES AND MASTOIDS: No acute abnormality. BONES AND SOFT TISSUES: Normal for age visible cervical spine and bone marrow signal. No acute soft tissue abnormality. IMPRESSION: 1. No acute intracranial abnormality. 2. Chronic mesial temporal lobe atrophy suspected. Mild for age chronic white matter changes. Electronically signed by: Helayne Hurst MD 06/24/2024 08:11 AM EST RP Workstation: [id number]   DG Shoulder Right Result Date: 06/23/2024 CLINICAL DATA:  Fall and right shoulder pain. EXAM: RIGHT SHOULDER - 2+ VIEW COMPARISON:  None Available. FINDINGS: Faint lucencies through the bony glenoid, likely artifactual or related to osteopenia. A fracture is less likely. No acute fracture. No dislocation. The bones are osteopenic. No effusion. The soft tissues are unremarkable. IMPRESSION: Artifact versus less likely fracture of the bony glenoid. Electronically Signed   By: Vanetta Chou M.D.   On: 06/23/2024 19:46   CT Head Wo Contrast Result Date: 06/23/2024 EXAM: CT HEAD WITHOUT CONTRAST 06/23/2024 07:11:28 PM TECHNIQUE: CT of the head was performed without the administration of intravenous contrast. Automated exposure control, iterative reconstruction, and/or weight based adjustment of the mA/kV was utilized to reduce the radiation dose to as low as reasonably achievable. COMPARISON: CT head 01/20/2024  CLINICAL HISTORY: Head trauma, minor (Age >= 65y) FINDINGS: BRAIN AND VENTRICLES: No acute hemorrhage. No evidence of acute infarct. No hydrocephalus. No extra-axial collection. No mass effect or midline shift. ORBITS: No acute abnormality. SINUSES: No acute abnormality. SOFT TISSUES AND SKULL: No acute soft tissue abnormality. No skull fracture. IMPRESSION: 1. No acute intracranial abnormality. Electronically signed by: Gilmore Molt MD 06/23/2024 07:20 PM EST RP Workstation: [id number]   DG Elbow 2 Views Right Result Date: 06/23/2024 EXAM: 1 or 2 VIEW(S) XRAY OF THE RIGHT ELBOW COMPARISON: None available. CLINICAL HISTORY: fall, injury FINDINGS: BONES AND JOINTS: No acute fracture. No focal osseous lesion. No malalignment. Possible small elbow effusion. SOFT TISSUES: The soft tissues are unremarkable. IMPRESSION: 1. Possible small elbow effusion. Occult fracture not excluded, suggest short-term radiographic follow-up. Electronically signed by: Luke Bun MD 06/23/2024 06:19 PM EST RP Workstation: [id number]    Microbiology: Results for orders placed or performed during the hospital encounter of 01/20/24  Culture, blood (Routine X 2) w Reflex to ID Panel     Status: None   Collection Time: 01/20/24  1:15 PM   Specimen: BLOOD  Result Value Ref Range Status   Specimen Description   Final    BLOOD LEFT ANTECUBITAL Performed at [facility], [address], [place], [place] [address]    Special Requests   Final  BOTTLES DRAWN AEROBIC AND ANAEROBIC Blood Culture adequate volume Performed at [facility], [address] [place], [place] [address]    Culture   Final    NO GROWTH 5 DAYS Performed at [facility], [address] [place], [place] [address]    Report Status 01/25/2024 FINAL  Final  Culture, blood (Routine X 2) w Reflex to ID Panel     Status: None   Collection Time: 01/20/24  1:20 PM   Specimen: BLOOD  Result Value Ref Range  Status   Specimen Description   Final    BLOOD RIGHT ANTECUBITAL Performed at [facility], [address], [place], [place] [address]    Special Requests   Final    BOTTLES DRAWN AEROBIC AND ANAEROBIC Blood Culture adequate volume Performed at [facility], [address] [place], [place] [address]    Culture   Final    NO GROWTH 5 DAYS Performed at [facility], [address], [place], [place] [address]    Report Status 01/25/2024 FINAL  Final    Labs: CBC: Recent Labs  Lab 06/23/24 1916 06/25/24 0327  WBC 22.0* 21.9*  NEUTROABS 10.6*  --   HGB 15.0 12.6  HCT 46.4* 39.0  MCV 94.3 93.8  PLT 241 211   Basic Metabolic Panel: Recent Labs  Lab 06/23/24 1916 06/25/24 0327  NA 139 141  K 4.3 3.4*  CL 103 106  CO2 24 25  GLUCOSE 105* 90  BUN 12 13  CREATININE 1.32* 1.16*  CALCIUM 9.1 8.4*  MG  --  2.2  PHOS  --  2.8   Liver Function Tests: Recent Labs  Lab 06/23/24 1916 06/25/24 0327  AST 25 21  ALT <5 <5  ALKPHOS 82 72  BILITOT 1.6* 1.7*  PROT 6.2* 5.1*  ALBUMIN 3.6 3.0*   CBG: No results for input(s): GLUCAP in the last 168 hours.  Discharge time spent: 38 minutes.  Signed: Deliliah Room, MD [facility] 06/26/2024

## 2024-06-29 ENCOUNTER — Telehealth: Payer: Self-pay | Admitting: Orthopedic Surgery

## 2024-06-29 NOTE — Telephone Encounter (Signed)
 Returned a call to [employer] at [facility] to schedule this pt [phone] - rt shoulder, ED 06/23/24.  LVM for her to cb

## 2024-07-07 ENCOUNTER — Encounter: Payer: Self-pay | Admitting: Orthopedic Surgery

## 2024-07-07 ENCOUNTER — Other Ambulatory Visit (INDEPENDENT_AMBULATORY_CARE_PROVIDER_SITE_OTHER): Payer: Self-pay

## 2024-07-07 ENCOUNTER — Ambulatory Visit (INDEPENDENT_AMBULATORY_CARE_PROVIDER_SITE_OTHER): Admitting: Orthopedic Surgery

## 2024-07-07 DIAGNOSIS — M25511 Pain in right shoulder: Secondary | ICD-10-CM | POA: Diagnosis not present

## 2024-07-07 DIAGNOSIS — S51011A Laceration without foreign body of right elbow, initial encounter: Secondary | ICD-10-CM

## 2024-07-07 NOTE — Progress Notes (Signed)
 New Patient Visit  Assessment: [name] Williams is a 89 y.o. female with the following: 1. Acute pain of right shoulder 2. Skin tear of right elbow without complication, initial encounter  Plan: [name] Williams fell approximately 2 weeks ago.  Repeat radiographs today are without acute injury.  No fracture sustained.  She does have a rotator cuff arthropathy.  This can explain the intermittent pain she has in the right shoulder.  She has decent range of motion on exam today.  Skin tear over the right elbow is healing appropriately.  Continue with local wound care.  If there are further issues, she will return to clinic.  Otherwise follow-up as needed.  I am recommending that she have assistance for ADLs.  This was documented for the nursing facility.  Follow-up: Return if symptoms worsen or fail to improve.  Subjective:  Chief Complaint  Patient presents with   Shoulder Pain    R shoulder    Hip Injury    History of Present Illness: [name] Williams is a 89 y.o. female who presents for evaluation of right shoulder pain.  Family states that she fell approximately 2 weeks ago.  She does not remember falling.  She tripped and fell on concrete, and sustained a skin tear of her right elbow.  She also had some right shoulder pain.  Radiographs were inconclusive for an injury.  She has since been admitted to a nursing facility.  According to family available in clinic today, she requires a lot of assistance.  Previously, she was living at home with one of her sons.  She was also driving.  Family stated concerns about her potentially returning home.  Prior to the fall, she states that she would have pain occasionally in the right shoulder.  No specific injury to the right shoulder.   Review of Systems: No fevers or chills No numbness or tingling No chest pain No shortness of breath No bowel or bladder dysfunction No GI distress No headaches   Medical History:  Past Medical History:  Diagnosis  Date   Anxiety    Depressive disorder, not elsewhere classified    Esophageal reflux    Essential hypertension, benign    Hypertr obst cardiomyop    Other and unspecified hyperlipidemia    Stroke (HCC)    Unspecified transient cerebral ischemia     Past Surgical History:  Procedure Laterality Date   BLADDER REPAIR     CERVIX SURGERY     CHOLECYSTECTOMY     TONSILLECTOMY     TUBAL LIGATION      Family History  Problem Relation Age of Onset   Breast cancer Mother    Stomach cancer Sister    Uterine cancer Sister    Depression Sister    Cancer Brother    Alcohol abuse Son    Social History   Tobacco Use   Smoking status: Some Days    Types: Cigarettes   Smokeless tobacco: Never   Tobacco comments:    quit 2012  Vaping Use   Vaping status: Never Used  Substance Use Topics   Alcohol use: No   Drug use: No    Allergies  Allergen Reactions   Latex Other (See Comments)    Per pt 11-10-14 she do not remember being allergic to this    Current Meds  Medication Sig   ASPIRIN  LOW DOSE 81 MG tablet Take 81 mg by mouth daily.   feeding supplement (ENSURE PLUS HIGH PROTEIN) LIQD  Take 237 mLs by mouth 2 (two) times daily between meals.   ibuprofen  (ADVIL ) 800 MG tablet Take 1 tablet (800 mg total) by mouth every 8 (eight) hours as needed for moderate pain (pain score 4-6).   metoprolol  tartrate (LOPRESSOR ) 25 MG tablet Take 0.5 tablets (12.5 mg total) by mouth 2 (two) times daily.   Multiple Vitamin (MULTIVITAMIN) tablet Take 1 tablet by mouth daily.    Objective: There were no vitals taken for this visit.  Physical Exam:  General: Elderly female., Alert and oriented., No acute distress., and Seated in a wheelchair.   Right shoulder without bruising.  No swelling.  Forward flexion of 100 degrees.  She tolerates gentle range of motion in the right shoulder.  Fingers are warm and well-perfused.  Skin tears over the elbow are clean and healing.  There is no surrounding  erythema.  No drainage or purulence.    IMAGING: I personally ordered and reviewed the following images  X-rays of the right shoulder were obtained in clinic today.  These are compared to prior x-rays.  No obvious injury in the right shoulder.  No fracture.  No dislocation.  There is proximal humeral migration.  The humeral head is abutting the undersurface of the acromion.  Impression: Right shoulder x-ray without acute injury; chronic rotator cuff injury suspected   New Medications:  No orders of the defined types were placed in this encounter.     Oneil DELENA Horde, MD  07/07/2024 9:35 AM

## 2024-09-07 ENCOUNTER — Ambulatory Visit: Payer: Self-pay | Admitting: Family
